# Patient Record
Sex: Female | Born: 1945
Health system: Southern US, Community
[De-identification: ages and names within clinical notes are randomized; demographics above are authoritative.]

## PROBLEM LIST (undated history)

## (undated) DIAGNOSIS — I4891 Unspecified atrial fibrillation: Secondary | ICD-10-CM

## (undated) DIAGNOSIS — I10 Essential (primary) hypertension: Secondary | ICD-10-CM

## (undated) DIAGNOSIS — I517 Cardiomegaly: Secondary | ICD-10-CM

## (undated) DIAGNOSIS — H409 Unspecified glaucoma: Secondary | ICD-10-CM

## (undated) HISTORY — PX: OTHER SURGICAL HISTORY: SHX169

## (undated) HISTORY — DX: Unspecified glaucoma: H40.9

---

## 2014-06-08 ENCOUNTER — Encounter (HOSPITAL_BASED_OUTPATIENT_CLINIC_OR_DEPARTMENT_OTHER): Payer: Self-pay | Admitting: Emergency Medicine

## 2014-06-08 ENCOUNTER — Emergency Department (HOSPITAL_BASED_OUTPATIENT_CLINIC_OR_DEPARTMENT_OTHER)
Admission: EM | Admit: 2014-06-08 | Discharge: 2014-06-08 | Disposition: A | Discharge status: Home / Self Care | Payer: Medicare HMO | Attending: Emergency Medicine | Admitting: Emergency Medicine

## 2014-06-08 DIAGNOSIS — Z79899 Other long term (current) drug therapy: Secondary | ICD-10-CM | POA: Diagnosis not present

## 2014-06-08 DIAGNOSIS — I10 Essential (primary) hypertension: Secondary | ICD-10-CM

## 2014-06-08 DIAGNOSIS — F17200 Nicotine dependence, unspecified, uncomplicated: Secondary | ICD-10-CM | POA: Insufficient documentation

## 2014-06-08 DIAGNOSIS — Z880 Allergy status to penicillin: Secondary | ICD-10-CM | POA: Insufficient documentation

## 2014-06-08 DIAGNOSIS — R51 Headache: Secondary | ICD-10-CM | POA: Diagnosis present

## 2014-06-08 DIAGNOSIS — G479 Sleep disorder, unspecified: Secondary | ICD-10-CM | POA: Diagnosis not present

## 2014-06-08 HISTORY — DX: Essential (primary) hypertension: I10

## 2014-06-08 HISTORY — DX: Cardiomegaly: I51.7

## 2014-06-08 MED ORDER — OXYCODONE-ACETAMINOPHEN 5-325 MG PO TABS
2.0000 | ORAL_TABLET | Freq: Once | ORAL | Status: AC
  Administered 2014-06-08: 2 via ORAL
  Filled 2014-06-08: qty 2

## 2014-06-08 NOTE — ED Notes (Signed)
Pt c/o HA, fatigue, and high bp since yesterday.

## 2014-06-08 NOTE — ED Provider Notes (Signed)
CSN: [id number]     Arrival date & time 06/08/14  1349 History  This chart was scribed for Rolan Bucco, MD by Nicholos Johns, ED scribe. This patient was seen in room MH01/MH01 and the patient's care was started at 3:21 PM.    Chief Complaint  Patient presents with  . Headache   The history is provided by the patient. No language interpreter was used.   HPI Comments: Brooke Gray is a 68 y.o. female who presents to the Emergency Department complaining of intermittent left frontal and parietal pounding HA, onset 1 day prior in the AM. HA initially began on the right side and has since moved to the left side. Reports same type of headaches in the past when her BP has been elevated.  Episode of chest pain yesterday that lasted a few seconds than resolved; no CP other than that one time. States BP has been up for the past couple of days; worsened as of yesterday. Did not physically take a BP but states she knows when it is up. Reports lack of sleep and exhaustion for the past 2 weeks. Believes this lack of sleep is responsible for elevated pressures. Has been prescribed sleeping pills by her PCP but does not take them daily; only as needed. Has used Melatonin before but did not like the side effects. Drinks a half a cup of coffee every AM. Denies numbness, weakness, chest pain, SOB, visual disturbances, or abdominal pain.    Past Medical History  Diagnosis Date  . Hypertension   . Cardiomegaly    History reviewed. No pertinent past surgical history. No family history on file. History  Substance Use Topics  . Smoking status: Current Every Day Smoker  . Smokeless tobacco: Not on file  . Alcohol Use: Yes   OB History   Grav Para Term Preterm Abortions TAB SAB Ect Mult Living                 Review of Systems  Constitutional: Negative for fever, chills, diaphoresis and fatigue.  HENT: Negative for congestion, rhinorrhea and sneezing.   Eyes: Negative.  Negative for visual disturbance.   Respiratory: Negative for cough, chest tightness and shortness of breath.   Cardiovascular: Negative for chest pain and leg swelling.  Gastrointestinal: Negative for nausea, vomiting, abdominal pain, diarrhea and blood in stool.  Genitourinary: Negative for frequency, hematuria, flank pain and difficulty urinating.  Musculoskeletal: Negative for arthralgias and back pain.  Skin: Negative for rash.  Neurological: Positive for headaches. Negative for dizziness, speech difficulty, weakness and numbness.  Psychiatric/Behavioral: Positive for sleep disturbance.  All other systems reviewed and are negative.  Allergies  Penicillins  Home Medications   Prior to Admission medications   Medication Sig Start Date End Date Taking? Authorizing Provider  amLODipine (NORVASC) 10 MG tablet Take 10 mg by mouth daily.   Yes Historical Provider, MD  HYDROcodone-acetaminophen (NORCO/VICODIN) 5-325 MG per tablet Take 1 tablet by mouth every 6 (six) hours as needed for moderate pain.   Yes Historical Provider, MD  lisinopril-hydrochlorothiazide (PRINZIDE,ZESTORETIC) 20-12.5 MG per tablet Take 1 tablet by mouth daily.   Yes Historical Provider, MD   Triage vitals: BP 147/85  Pulse 52  Temp(Src) 97.6 F (36.4 C) (Oral)  Resp 18  Ht 5\' 3"  (1.6 m)  Wt 135 lb (61.236 kg)  BMI 23.92 kg/m2  SpO2 100%  Physical Exam  Nursing note and vitals reviewed. Constitutional: She is oriented to person, place, and time. She appears well-developed  and well-nourished.  HENT:  Head: Normocephalic and atraumatic.  Eyes: Pupils are equal, round, and reactive to light.  Neck: Normal range of motion. Neck supple.  Cardiovascular: Normal rate, regular rhythm and normal heart sounds.   Pulmonary/Chest: Effort normal and breath sounds normal. No respiratory distress. She has no wheezes. She has no rales. She exhibits no tenderness.  Abdominal: Soft. Bowel sounds are normal. There is no tenderness. There is no rebound and no  guarding.  Musculoskeletal: Normal range of motion. She exhibits no edema.  Lymphadenopathy:    She has no cervical adenopathy.  Neurological: She is alert and oriented to person, place, and time.  Skin: Skin is warm and dry. No rash noted.  Psychiatric: She has a normal mood and affect.   3:31 PM: BP laying down; right arm: 165/98   ED Course  Procedures (including critical care time) DIAGNOSTIC STUDIES: Oxygen Saturation is 100% on room air, normal by my interpretation.    COORDINATION OF CARE: At 3:28 PM: Discussed treatment plan with patient which includes EKG and pain medication for HA. Patient agrees.    Labs Review Labs Reviewed - No data to display  Imaging Review No results found.   EKG Interpretation   Date/Time:  Sunday June 08 2014 15:41:41 EDT Ventricular Rate:  54 PR Interval:  184 QRS Duration: 76 QT Interval:  492 QTC Calculation: 466 R Axis:   12 Text Interpretation:  Sinus bradycardia Possible Left atrial enlargement  Left ventricular hypertrophy ST \\T \ T wave abnormality, consider inferior  ischemia Abnormal ECG No old tracing to compare Confirmed by Andalyn Heckstall  MD,  Leaner Morici ([id number]) on 06/08/2014 4:09:42 PM      MDM   Final diagnoses:  Essential hypertension   Pt's BP improved since arrival.  No evidence of hypertensive emergency/urgency.  EKG shows changes of LVH which pt is aware of.  Some biphasic t waves inferiorly, no old EKGs for comparison, but pt has only had one episode of CP yesterday for only a few seconds, none today, no SOB or other signs of ACS.  Pt was given percocet for her headache.  Will f/u with her PMD about her BP.  I personally performed the services described in this documentation, which was scribed in my presence. The recorded information has been reviewed and is accurate.     Rolan BuccoMelanie Bryer Gottsch, MD 06/08/14 [phone]

## 2014-06-08 NOTE — ED Notes (Signed)
Patient states that her BP has been irregular for the past two weeks, difficulty sleeping

## 2014-06-08 NOTE — Discharge Instructions (Signed)

## 2015-12-07 DIAGNOSIS — I10 Essential (primary) hypertension: Secondary | ICD-10-CM | POA: Diagnosis not present

## 2015-12-07 DIAGNOSIS — I119 Hypertensive heart disease without heart failure: Secondary | ICD-10-CM | POA: Diagnosis not present

## 2015-12-07 DIAGNOSIS — Z0001 Encounter for general adult medical examination with abnormal findings: Secondary | ICD-10-CM | POA: Diagnosis not present

## 2015-12-07 DIAGNOSIS — G629 Polyneuropathy, unspecified: Secondary | ICD-10-CM | POA: Diagnosis not present

## 2015-12-07 DIAGNOSIS — R7301 Impaired fasting glucose: Secondary | ICD-10-CM | POA: Diagnosis not present

## 2015-12-28 ENCOUNTER — Ambulatory Visit: Payer: Self-pay | Admitting: Podiatry

## 2016-01-11 DIAGNOSIS — I10 Essential (primary) hypertension: Secondary | ICD-10-CM | POA: Diagnosis not present

## 2016-01-11 DIAGNOSIS — I119 Hypertensive heart disease without heart failure: Secondary | ICD-10-CM | POA: Diagnosis not present

## 2016-01-20 DIAGNOSIS — I10 Essential (primary) hypertension: Secondary | ICD-10-CM | POA: Diagnosis not present

## 2016-01-20 DIAGNOSIS — I119 Hypertensive heart disease without heart failure: Secondary | ICD-10-CM | POA: Diagnosis not present

## 2016-01-21 ENCOUNTER — Ambulatory Visit: Payer: Self-pay | Admitting: Podiatry

## 2016-02-17 DIAGNOSIS — R921 Mammographic calcification found on diagnostic imaging of breast: Secondary | ICD-10-CM | POA: Diagnosis not present

## 2016-02-17 DIAGNOSIS — Z1231 Encounter for screening mammogram for malignant neoplasm of breast: Secondary | ICD-10-CM | POA: Diagnosis not present

## 2016-02-17 DIAGNOSIS — Z139 Encounter for screening, unspecified: Secondary | ICD-10-CM | POA: Diagnosis not present

## 2016-02-19 DIAGNOSIS — E785 Hyperlipidemia, unspecified: Secondary | ICD-10-CM | POA: Diagnosis not present

## 2016-02-19 DIAGNOSIS — Z720 Tobacco use: Secondary | ICD-10-CM | POA: Diagnosis not present

## 2016-02-19 DIAGNOSIS — G629 Polyneuropathy, unspecified: Secondary | ICD-10-CM | POA: Diagnosis not present

## 2016-02-19 DIAGNOSIS — I10 Essential (primary) hypertension: Secondary | ICD-10-CM | POA: Diagnosis not present

## 2016-02-19 DIAGNOSIS — I119 Hypertensive heart disease without heart failure: Secondary | ICD-10-CM | POA: Diagnosis not present

## 2016-02-25 DIAGNOSIS — I119 Hypertensive heart disease without heart failure: Secondary | ICD-10-CM | POA: Diagnosis not present

## 2016-02-25 DIAGNOSIS — I10 Essential (primary) hypertension: Secondary | ICD-10-CM | POA: Diagnosis not present

## 2016-03-17 DIAGNOSIS — I119 Hypertensive heart disease without heart failure: Secondary | ICD-10-CM | POA: Diagnosis not present

## 2016-03-17 DIAGNOSIS — I10 Essential (primary) hypertension: Secondary | ICD-10-CM | POA: Diagnosis not present

## 2016-04-19 DIAGNOSIS — I10 Essential (primary) hypertension: Secondary | ICD-10-CM | POA: Diagnosis not present

## 2016-04-19 DIAGNOSIS — I119 Hypertensive heart disease without heart failure: Secondary | ICD-10-CM | POA: Diagnosis not present

## 2016-05-06 DIAGNOSIS — I10 Essential (primary) hypertension: Secondary | ICD-10-CM | POA: Diagnosis not present

## 2016-05-06 DIAGNOSIS — Z720 Tobacco use: Secondary | ICD-10-CM | POA: Diagnosis not present

## 2016-05-06 DIAGNOSIS — R7303 Prediabetes: Secondary | ICD-10-CM | POA: Diagnosis not present

## 2016-05-06 DIAGNOSIS — E785 Hyperlipidemia, unspecified: Secondary | ICD-10-CM | POA: Diagnosis not present

## 2016-05-06 DIAGNOSIS — I119 Hypertensive heart disease without heart failure: Secondary | ICD-10-CM | POA: Diagnosis not present

## 2016-05-06 DIAGNOSIS — G629 Polyneuropathy, unspecified: Secondary | ICD-10-CM | POA: Diagnosis not present

## 2016-06-22 DIAGNOSIS — H401121 Primary open-angle glaucoma, left eye, mild stage: Secondary | ICD-10-CM | POA: Diagnosis not present

## 2016-06-22 DIAGNOSIS — H401113 Primary open-angle glaucoma, right eye, severe stage: Secondary | ICD-10-CM | POA: Diagnosis not present

## 2016-06-22 DIAGNOSIS — H2513 Age-related nuclear cataract, bilateral: Secondary | ICD-10-CM | POA: Diagnosis not present

## 2016-06-22 DIAGNOSIS — H52221 Regular astigmatism, right eye: Secondary | ICD-10-CM | POA: Diagnosis not present

## 2016-06-28 DIAGNOSIS — I10 Essential (primary) hypertension: Secondary | ICD-10-CM | POA: Diagnosis not present

## 2016-06-28 DIAGNOSIS — I119 Hypertensive heart disease without heart failure: Secondary | ICD-10-CM | POA: Diagnosis not present

## 2016-07-08 DIAGNOSIS — E785 Hyperlipidemia, unspecified: Secondary | ICD-10-CM | POA: Diagnosis not present

## 2016-07-08 DIAGNOSIS — Z720 Tobacco use: Secondary | ICD-10-CM | POA: Diagnosis not present

## 2016-07-08 DIAGNOSIS — G629 Polyneuropathy, unspecified: Secondary | ICD-10-CM | POA: Diagnosis not present

## 2016-07-08 DIAGNOSIS — I119 Hypertensive heart disease without heart failure: Secondary | ICD-10-CM | POA: Diagnosis not present

## 2016-07-08 DIAGNOSIS — I10 Essential (primary) hypertension: Secondary | ICD-10-CM | POA: Diagnosis not present

## 2016-07-11 DIAGNOSIS — L814 Other melanin hyperpigmentation: Secondary | ICD-10-CM | POA: Diagnosis not present

## 2016-07-11 DIAGNOSIS — R21 Rash and other nonspecific skin eruption: Secondary | ICD-10-CM | POA: Diagnosis not present

## 2016-07-20 ENCOUNTER — Ambulatory Visit: Payer: Medicare Other | Admitting: Podiatry

## 2016-07-21 DIAGNOSIS — Z720 Tobacco use: Secondary | ICD-10-CM | POA: Diagnosis not present

## 2016-07-21 DIAGNOSIS — E785 Hyperlipidemia, unspecified: Secondary | ICD-10-CM | POA: Diagnosis not present

## 2016-07-21 DIAGNOSIS — G629 Polyneuropathy, unspecified: Secondary | ICD-10-CM | POA: Diagnosis not present

## 2016-07-21 DIAGNOSIS — I119 Hypertensive heart disease without heart failure: Secondary | ICD-10-CM | POA: Diagnosis not present

## 2016-07-21 DIAGNOSIS — I10 Essential (primary) hypertension: Secondary | ICD-10-CM | POA: Diagnosis not present

## 2016-08-12 DIAGNOSIS — I10 Essential (primary) hypertension: Secondary | ICD-10-CM | POA: Diagnosis not present

## 2016-08-12 DIAGNOSIS — I119 Hypertensive heart disease without heart failure: Secondary | ICD-10-CM | POA: Diagnosis not present

## 2016-09-08 DIAGNOSIS — I10 Essential (primary) hypertension: Secondary | ICD-10-CM | POA: Diagnosis not present

## 2016-09-08 DIAGNOSIS — I119 Hypertensive heart disease without heart failure: Secondary | ICD-10-CM | POA: Diagnosis not present

## 2016-09-09 DIAGNOSIS — G629 Polyneuropathy, unspecified: Secondary | ICD-10-CM | POA: Diagnosis not present

## 2016-09-09 DIAGNOSIS — I10 Essential (primary) hypertension: Secondary | ICD-10-CM | POA: Diagnosis not present

## 2016-09-09 DIAGNOSIS — I119 Hypertensive heart disease without heart failure: Secondary | ICD-10-CM | POA: Diagnosis not present

## 2016-09-09 DIAGNOSIS — E785 Hyperlipidemia, unspecified: Secondary | ICD-10-CM | POA: Diagnosis not present

## 2016-09-09 DIAGNOSIS — M25561 Pain in right knee: Secondary | ICD-10-CM | POA: Diagnosis not present

## 2016-09-26 DIAGNOSIS — M25461 Effusion, right knee: Secondary | ICD-10-CM | POA: Diagnosis not present

## 2016-09-26 DIAGNOSIS — M25561 Pain in right knee: Secondary | ICD-10-CM | POA: Diagnosis not present

## 2016-09-26 DIAGNOSIS — M1711 Unilateral primary osteoarthritis, right knee: Secondary | ICD-10-CM | POA: Diagnosis not present

## 2016-10-05 DIAGNOSIS — I119 Hypertensive heart disease without heart failure: Secondary | ICD-10-CM | POA: Diagnosis not present

## 2016-10-05 DIAGNOSIS — I10 Essential (primary) hypertension: Secondary | ICD-10-CM | POA: Diagnosis not present

## 2016-11-01 DIAGNOSIS — I10 Essential (primary) hypertension: Secondary | ICD-10-CM | POA: Diagnosis not present

## 2016-11-01 DIAGNOSIS — G629 Polyneuropathy, unspecified: Secondary | ICD-10-CM | POA: Diagnosis not present

## 2016-11-01 DIAGNOSIS — R7303 Prediabetes: Secondary | ICD-10-CM | POA: Diagnosis not present

## 2016-11-01 DIAGNOSIS — Z720 Tobacco use: Secondary | ICD-10-CM | POA: Diagnosis not present

## 2016-11-01 DIAGNOSIS — R739 Hyperglycemia, unspecified: Secondary | ICD-10-CM | POA: Diagnosis not present

## 2016-11-01 DIAGNOSIS — Z7689 Persons encountering health services in other specified circumstances: Secondary | ICD-10-CM | POA: Diagnosis not present

## 2016-11-01 DIAGNOSIS — I119 Hypertensive heart disease without heart failure: Secondary | ICD-10-CM | POA: Diagnosis not present

## 2016-11-01 DIAGNOSIS — E785 Hyperlipidemia, unspecified: Secondary | ICD-10-CM | POA: Diagnosis not present

## 2016-11-03 DIAGNOSIS — I10 Essential (primary) hypertension: Secondary | ICD-10-CM | POA: Diagnosis not present

## 2016-11-03 DIAGNOSIS — I119 Hypertensive heart disease without heart failure: Secondary | ICD-10-CM | POA: Diagnosis not present

## 2016-11-24 DIAGNOSIS — R7303 Prediabetes: Secondary | ICD-10-CM | POA: Diagnosis not present

## 2016-11-24 DIAGNOSIS — I119 Hypertensive heart disease without heart failure: Secondary | ICD-10-CM | POA: Diagnosis not present

## 2016-11-24 DIAGNOSIS — Z5181 Encounter for therapeutic drug level monitoring: Secondary | ICD-10-CM | POA: Diagnosis not present

## 2016-11-24 DIAGNOSIS — Z720 Tobacco use: Secondary | ICD-10-CM | POA: Diagnosis not present

## 2016-11-24 DIAGNOSIS — G629 Polyneuropathy, unspecified: Secondary | ICD-10-CM | POA: Diagnosis not present

## 2016-11-24 DIAGNOSIS — I10 Essential (primary) hypertension: Secondary | ICD-10-CM | POA: Diagnosis not present

## 2016-11-24 DIAGNOSIS — E785 Hyperlipidemia, unspecified: Secondary | ICD-10-CM | POA: Diagnosis not present

## 2016-12-05 DIAGNOSIS — I119 Hypertensive heart disease without heart failure: Secondary | ICD-10-CM | POA: Diagnosis not present

## 2016-12-05 DIAGNOSIS — E785 Hyperlipidemia, unspecified: Secondary | ICD-10-CM | POA: Diagnosis not present

## 2016-12-05 DIAGNOSIS — G629 Polyneuropathy, unspecified: Secondary | ICD-10-CM | POA: Diagnosis not present

## 2016-12-05 DIAGNOSIS — Z0000 Encounter for general adult medical examination without abnormal findings: Secondary | ICD-10-CM | POA: Diagnosis not present

## 2016-12-05 DIAGNOSIS — I10 Essential (primary) hypertension: Secondary | ICD-10-CM | POA: Diagnosis not present

## 2017-01-16 DIAGNOSIS — H401121 Primary open-angle glaucoma, left eye, mild stage: Secondary | ICD-10-CM | POA: Diagnosis not present

## 2017-01-16 DIAGNOSIS — H2513 Age-related nuclear cataract, bilateral: Secondary | ICD-10-CM | POA: Diagnosis not present

## 2017-01-16 DIAGNOSIS — H401113 Primary open-angle glaucoma, right eye, severe stage: Secondary | ICD-10-CM | POA: Diagnosis not present

## 2017-02-07 DIAGNOSIS — I10 Essential (primary) hypertension: Secondary | ICD-10-CM | POA: Diagnosis not present

## 2017-02-07 DIAGNOSIS — M25561 Pain in right knee: Secondary | ICD-10-CM | POA: Diagnosis not present

## 2017-02-07 DIAGNOSIS — N760 Acute vaginitis: Secondary | ICD-10-CM | POA: Diagnosis not present

## 2017-02-07 DIAGNOSIS — I119 Hypertensive heart disease without heart failure: Secondary | ICD-10-CM | POA: Diagnosis not present

## 2017-02-07 DIAGNOSIS — E785 Hyperlipidemia, unspecified: Secondary | ICD-10-CM | POA: Diagnosis not present

## 2017-05-08 DIAGNOSIS — G629 Polyneuropathy, unspecified: Secondary | ICD-10-CM | POA: Diagnosis not present

## 2017-05-08 DIAGNOSIS — I10 Essential (primary) hypertension: Secondary | ICD-10-CM | POA: Diagnosis not present

## 2017-05-08 DIAGNOSIS — I119 Hypertensive heart disease without heart failure: Secondary | ICD-10-CM | POA: Diagnosis not present

## 2017-05-08 DIAGNOSIS — E785 Hyperlipidemia, unspecified: Secondary | ICD-10-CM | POA: Diagnosis not present

## 2017-05-08 DIAGNOSIS — R7303 Prediabetes: Secondary | ICD-10-CM | POA: Diagnosis not present

## 2017-05-08 DIAGNOSIS — Z0000 Encounter for general adult medical examination without abnormal findings: Secondary | ICD-10-CM | POA: Diagnosis not present

## 2017-05-16 ENCOUNTER — Ambulatory Visit (INDEPENDENT_AMBULATORY_CARE_PROVIDER_SITE_OTHER): Payer: Medicare Other | Admitting: Podiatry

## 2017-05-16 ENCOUNTER — Encounter: Payer: Self-pay | Admitting: Podiatry

## 2017-05-16 VITALS — BP 147/90 | HR 65 | Ht 63.0 in | Wt 131.0 lb

## 2017-05-16 DIAGNOSIS — M21962 Unspecified acquired deformity of left lower leg: Secondary | ICD-10-CM | POA: Diagnosis not present

## 2017-05-16 DIAGNOSIS — Q828 Other specified congenital malformations of skin: Secondary | ICD-10-CM | POA: Diagnosis not present

## 2017-05-16 DIAGNOSIS — M79672 Pain in left foot: Secondary | ICD-10-CM | POA: Diagnosis not present

## 2017-05-16 NOTE — Progress Notes (Signed)
SUBJECTIVE: 71 y.o. year old female presents complaining of a corn inside of callus under the 5th toe left foot. She trims it to get relief. Patient is a Dietitiandance instructor.   REVIEW OF SYSTEMS: Pertinent items noted in HPI and remainder of comprehensive ROS otherwise negative.  OBJECTIVE: DERMATOLOGIC EXAMINATION: Plantar porokeratosis sub 5 L>R. Painful on left.  VASCULAR EXAMINATION OF LOWER LIMBS: Pedal pulses: All pedal pulses are faintly palpable on both DP and PT bilateral. Capillary Filling times within 3 seconds in all digits.  Temperature gradient from tibial crest to dorsum of foot is within normal bilateral.  NEUROLOGIC EXAMINATION OF THE LOWER LIMBS: All epicritic and tactile sensations grossly intact. Sharp and Dull discriminatory sensations at the plantar ball of hallux is intact bilateral.   MUSCULOSKELETAL EXAMINATION: Positive for mild bunion on left foot.   ASSESSMENT: Painful porokeratosis sub 5 left.  PLAN: Reviewed findings and available options. Both feet 5th MPJ plantar lesions debrided. Return as needed.

## 2017-05-16 NOTE — Patient Instructions (Signed)
Seen for painful callus under the 5th MPJ left foot. Lesion debrided. Return as needed.

## 2017-05-18 DIAGNOSIS — H401113 Primary open-angle glaucoma, right eye, severe stage: Secondary | ICD-10-CM | POA: Diagnosis not present

## 2017-05-18 DIAGNOSIS — H2513 Age-related nuclear cataract, bilateral: Secondary | ICD-10-CM | POA: Diagnosis not present

## 2017-05-18 DIAGNOSIS — H04123 Dry eye syndrome of bilateral lacrimal glands: Secondary | ICD-10-CM | POA: Diagnosis not present

## 2017-05-18 DIAGNOSIS — H401121 Primary open-angle glaucoma, left eye, mild stage: Secondary | ICD-10-CM | POA: Diagnosis not present

## 2017-05-18 DIAGNOSIS — H5211 Myopia, right eye: Secondary | ICD-10-CM | POA: Diagnosis not present

## 2017-05-29 DIAGNOSIS — E785 Hyperlipidemia, unspecified: Secondary | ICD-10-CM | POA: Diagnosis not present

## 2017-05-29 DIAGNOSIS — I10 Essential (primary) hypertension: Secondary | ICD-10-CM | POA: Diagnosis not present

## 2017-05-29 DIAGNOSIS — G629 Polyneuropathy, unspecified: Secondary | ICD-10-CM | POA: Diagnosis not present

## 2017-05-29 DIAGNOSIS — I119 Hypertensive heart disease without heart failure: Secondary | ICD-10-CM | POA: Diagnosis not present

## 2017-09-04 DIAGNOSIS — M21612 Bunion of left foot: Secondary | ICD-10-CM | POA: Diagnosis not present

## 2017-09-04 DIAGNOSIS — L851 Acquired keratosis [keratoderma] palmaris et plantaris: Secondary | ICD-10-CM | POA: Diagnosis not present

## 2017-09-04 DIAGNOSIS — M21611 Bunion of right foot: Secondary | ICD-10-CM | POA: Diagnosis not present

## 2017-10-30 DIAGNOSIS — I10 Essential (primary) hypertension: Secondary | ICD-10-CM | POA: Diagnosis not present

## 2017-10-30 DIAGNOSIS — E785 Hyperlipidemia, unspecified: Secondary | ICD-10-CM | POA: Diagnosis not present

## 2017-10-30 DIAGNOSIS — R7303 Prediabetes: Secondary | ICD-10-CM | POA: Diagnosis not present

## 2017-10-30 DIAGNOSIS — I119 Hypertensive heart disease without heart failure: Secondary | ICD-10-CM | POA: Diagnosis not present

## 2017-10-30 DIAGNOSIS — G629 Polyneuropathy, unspecified: Secondary | ICD-10-CM | POA: Diagnosis not present

## 2017-11-03 DIAGNOSIS — J069 Acute upper respiratory infection, unspecified: Secondary | ICD-10-CM | POA: Diagnosis not present

## 2017-12-08 DIAGNOSIS — Z136 Encounter for screening for cardiovascular disorders: Secondary | ICD-10-CM | POA: Diagnosis not present

## 2017-12-08 DIAGNOSIS — Z01118 Encounter for examination of ears and hearing with other abnormal findings: Secondary | ICD-10-CM | POA: Diagnosis not present

## 2017-12-08 DIAGNOSIS — Z87891 Personal history of nicotine dependence: Secondary | ICD-10-CM | POA: Diagnosis not present

## 2017-12-08 DIAGNOSIS — Z23 Encounter for immunization: Secondary | ICD-10-CM | POA: Diagnosis not present

## 2017-12-08 DIAGNOSIS — Z131 Encounter for screening for diabetes mellitus: Secondary | ICD-10-CM | POA: Diagnosis not present

## 2017-12-08 DIAGNOSIS — Z0000 Encounter for general adult medical examination without abnormal findings: Secondary | ICD-10-CM | POA: Diagnosis not present

## 2017-12-08 DIAGNOSIS — I119 Hypertensive heart disease without heart failure: Secondary | ICD-10-CM | POA: Diagnosis not present

## 2017-12-20 ENCOUNTER — Other Ambulatory Visit: Payer: Self-pay | Admitting: Internal Medicine

## 2017-12-20 DIAGNOSIS — E2839 Other primary ovarian failure: Secondary | ICD-10-CM

## 2017-12-20 DIAGNOSIS — Z1231 Encounter for screening mammogram for malignant neoplasm of breast: Secondary | ICD-10-CM

## 2018-01-02 DIAGNOSIS — E785 Hyperlipidemia, unspecified: Secondary | ICD-10-CM | POA: Diagnosis not present

## 2018-01-02 DIAGNOSIS — I119 Hypertensive heart disease without heart failure: Secondary | ICD-10-CM | POA: Diagnosis not present

## 2018-01-02 DIAGNOSIS — R7303 Prediabetes: Secondary | ICD-10-CM | POA: Diagnosis not present

## 2018-01-02 DIAGNOSIS — I10 Essential (primary) hypertension: Secondary | ICD-10-CM | POA: Diagnosis not present

## 2018-01-09 ENCOUNTER — Inpatient Hospital Stay
Admission: RE | Admit: 2018-01-09 | Discharge: 2018-01-09 | Disposition: A | Discharge status: Home / Self Care | Payer: Medicare HMO | Source: Ambulatory Visit | Attending: Internal Medicine | Admitting: Internal Medicine

## 2018-01-09 ENCOUNTER — Ambulatory Visit: Payer: Medicare HMO

## 2018-01-10 DIAGNOSIS — H401113 Primary open-angle glaucoma, right eye, severe stage: Secondary | ICD-10-CM | POA: Diagnosis not present

## 2018-01-10 DIAGNOSIS — Z79899 Other long term (current) drug therapy: Secondary | ICD-10-CM | POA: Diagnosis not present

## 2018-01-10 DIAGNOSIS — H401121 Primary open-angle glaucoma, left eye, mild stage: Secondary | ICD-10-CM | POA: Diagnosis not present

## 2018-01-25 DIAGNOSIS — I119 Hypertensive heart disease without heart failure: Secondary | ICD-10-CM | POA: Diagnosis not present

## 2018-01-25 DIAGNOSIS — R7303 Prediabetes: Secondary | ICD-10-CM | POA: Diagnosis not present

## 2018-01-25 DIAGNOSIS — I10 Essential (primary) hypertension: Secondary | ICD-10-CM | POA: Diagnosis not present

## 2018-01-25 DIAGNOSIS — E785 Hyperlipidemia, unspecified: Secondary | ICD-10-CM | POA: Diagnosis not present

## 2018-04-19 DIAGNOSIS — I119 Hypertensive heart disease without heart failure: Secondary | ICD-10-CM | POA: Diagnosis not present

## 2018-04-19 DIAGNOSIS — I10 Essential (primary) hypertension: Secondary | ICD-10-CM | POA: Diagnosis not present

## 2018-04-19 DIAGNOSIS — R7303 Prediabetes: Secondary | ICD-10-CM | POA: Diagnosis not present

## 2018-04-19 DIAGNOSIS — J302 Other seasonal allergic rhinitis: Secondary | ICD-10-CM | POA: Diagnosis not present

## 2018-05-23 DIAGNOSIS — Z1211 Encounter for screening for malignant neoplasm of colon: Secondary | ICD-10-CM | POA: Diagnosis not present

## 2018-05-23 DIAGNOSIS — Z1212 Encounter for screening for malignant neoplasm of rectum: Secondary | ICD-10-CM | POA: Diagnosis not present

## 2018-06-29 DIAGNOSIS — Z0000 Encounter for general adult medical examination without abnormal findings: Secondary | ICD-10-CM | POA: Diagnosis not present

## 2018-06-29 DIAGNOSIS — E785 Hyperlipidemia, unspecified: Secondary | ICD-10-CM | POA: Diagnosis not present

## 2018-06-29 DIAGNOSIS — I119 Hypertensive heart disease without heart failure: Secondary | ICD-10-CM | POA: Diagnosis not present

## 2018-08-15 DIAGNOSIS — H401121 Primary open-angle glaucoma, left eye, mild stage: Secondary | ICD-10-CM | POA: Diagnosis not present

## 2018-08-15 DIAGNOSIS — H401113 Primary open-angle glaucoma, right eye, severe stage: Secondary | ICD-10-CM | POA: Diagnosis not present

## 2018-08-15 DIAGNOSIS — H04123 Dry eye syndrome of bilateral lacrimal glands: Secondary | ICD-10-CM | POA: Diagnosis not present

## 2018-08-15 DIAGNOSIS — H2513 Age-related nuclear cataract, bilateral: Secondary | ICD-10-CM | POA: Diagnosis not present

## 2018-08-28 DIAGNOSIS — I10 Essential (primary) hypertension: Secondary | ICD-10-CM | POA: Diagnosis not present

## 2018-08-28 DIAGNOSIS — E785 Hyperlipidemia, unspecified: Secondary | ICD-10-CM | POA: Diagnosis not present

## 2018-08-28 DIAGNOSIS — J302 Other seasonal allergic rhinitis: Secondary | ICD-10-CM | POA: Diagnosis not present

## 2018-08-28 DIAGNOSIS — I119 Hypertensive heart disease without heart failure: Secondary | ICD-10-CM | POA: Diagnosis not present

## 2018-09-20 ENCOUNTER — Other Ambulatory Visit: Payer: Self-pay

## 2018-09-20 NOTE — Patient Outreach (Signed)
[facility]) Care Management  09/20/2018  Brooke Gray 21-Nov-1945 [id number]   Medication Adherence call to Mrs. Brooke Gray left a message for patient to call back patient is due on Olmesartan/HCTZ under [facility] Ins.    Lillia Abed CPhT Pharmacy Technician [facility] Management Direct Dial [phone]  Fax [phone] [email or web address]

## 2018-10-05 DIAGNOSIS — I10 Essential (primary) hypertension: Secondary | ICD-10-CM | POA: Diagnosis not present

## 2018-10-05 DIAGNOSIS — I119 Hypertensive heart disease without heart failure: Secondary | ICD-10-CM | POA: Diagnosis not present

## 2018-10-05 DIAGNOSIS — F5101 Primary insomnia: Secondary | ICD-10-CM | POA: Diagnosis not present

## 2018-10-05 DIAGNOSIS — E785 Hyperlipidemia, unspecified: Secondary | ICD-10-CM | POA: Diagnosis not present

## 2018-10-05 DIAGNOSIS — J302 Other seasonal allergic rhinitis: Secondary | ICD-10-CM | POA: Diagnosis not present

## 2018-10-30 DIAGNOSIS — E785 Hyperlipidemia, unspecified: Secondary | ICD-10-CM | POA: Diagnosis not present

## 2018-10-30 DIAGNOSIS — I10 Essential (primary) hypertension: Secondary | ICD-10-CM | POA: Diagnosis not present

## 2018-10-30 DIAGNOSIS — I119 Hypertensive heart disease without heart failure: Secondary | ICD-10-CM | POA: Diagnosis not present

## 2018-10-30 DIAGNOSIS — J302 Other seasonal allergic rhinitis: Secondary | ICD-10-CM | POA: Diagnosis not present

## 2018-10-30 DIAGNOSIS — F5101 Primary insomnia: Secondary | ICD-10-CM | POA: Diagnosis not present

## 2018-12-03 DIAGNOSIS — H0261 Xanthelasma of right upper eyelid: Secondary | ICD-10-CM | POA: Diagnosis not present

## 2018-12-03 DIAGNOSIS — L810 Postinflammatory hyperpigmentation: Secondary | ICD-10-CM | POA: Diagnosis not present

## 2019-01-22 DIAGNOSIS — Z0001 Encounter for general adult medical examination with abnormal findings: Secondary | ICD-10-CM | POA: Diagnosis not present

## 2019-01-22 DIAGNOSIS — Z87891 Personal history of nicotine dependence: Secondary | ICD-10-CM | POA: Diagnosis not present

## 2019-01-22 DIAGNOSIS — R7303 Prediabetes: Secondary | ICD-10-CM | POA: Diagnosis not present

## 2019-01-22 DIAGNOSIS — J302 Other seasonal allergic rhinitis: Secondary | ICD-10-CM | POA: Diagnosis not present

## 2019-01-22 DIAGNOSIS — I10 Essential (primary) hypertension: Secondary | ICD-10-CM | POA: Diagnosis not present

## 2019-01-22 DIAGNOSIS — I119 Hypertensive heart disease without heart failure: Secondary | ICD-10-CM | POA: Diagnosis not present

## 2019-01-22 DIAGNOSIS — E785 Hyperlipidemia, unspecified: Secondary | ICD-10-CM | POA: Diagnosis not present

## 2019-01-22 DIAGNOSIS — F5101 Primary insomnia: Secondary | ICD-10-CM | POA: Diagnosis not present

## 2019-03-19 DIAGNOSIS — J302 Other seasonal allergic rhinitis: Secondary | ICD-10-CM | POA: Diagnosis not present

## 2019-03-19 DIAGNOSIS — I10 Essential (primary) hypertension: Secondary | ICD-10-CM | POA: Diagnosis not present

## 2019-03-19 DIAGNOSIS — R7303 Prediabetes: Secondary | ICD-10-CM | POA: Diagnosis not present

## 2019-03-19 DIAGNOSIS — F419 Anxiety disorder, unspecified: Secondary | ICD-10-CM | POA: Diagnosis not present

## 2019-03-19 DIAGNOSIS — I119 Hypertensive heart disease without heart failure: Secondary | ICD-10-CM | POA: Diagnosis not present

## 2019-03-19 DIAGNOSIS — Z87891 Personal history of nicotine dependence: Secondary | ICD-10-CM | POA: Diagnosis not present

## 2019-03-19 DIAGNOSIS — F5101 Primary insomnia: Secondary | ICD-10-CM | POA: Diagnosis not present

## 2019-03-19 DIAGNOSIS — R0989 Other specified symptoms and signs involving the circulatory and respiratory systems: Secondary | ICD-10-CM | POA: Diagnosis not present

## 2019-03-19 DIAGNOSIS — E785 Hyperlipidemia, unspecified: Secondary | ICD-10-CM | POA: Diagnosis not present

## 2019-04-10 ENCOUNTER — Other Ambulatory Visit: Payer: Self-pay

## 2019-04-10 ENCOUNTER — Ambulatory Visit: Payer: Medicare HMO | Admitting: Allergy and Immunology

## 2019-04-10 ENCOUNTER — Encounter: Payer: Self-pay | Admitting: Allergy and Immunology

## 2019-04-10 VITALS — BP 134/60 | HR 76 | Temp 98.2°F | Resp 16 | Ht 63.0 in | Wt 129.6 lb

## 2019-04-10 DIAGNOSIS — R09A2 Foreign body sensation, throat: Secondary | ICD-10-CM

## 2019-04-10 DIAGNOSIS — Z87891 Personal history of nicotine dependence: Secondary | ICD-10-CM | POA: Insufficient documentation

## 2019-04-10 DIAGNOSIS — R0989 Other specified symptoms and signs involving the circulatory and respiratory systems: Secondary | ICD-10-CM | POA: Diagnosis not present

## 2019-04-10 DIAGNOSIS — J310 Chronic rhinitis: Secondary | ICD-10-CM | POA: Insufficient documentation

## 2019-04-10 DIAGNOSIS — Z720 Tobacco use: Secondary | ICD-10-CM | POA: Diagnosis not present

## 2019-04-10 DIAGNOSIS — R198 Other specified symptoms and signs involving the digestive system and abdomen: Secondary | ICD-10-CM | POA: Insufficient documentation

## 2019-04-10 MED ORDER — AZELASTINE HCL 0.1 % NA SOLN: NASAL | 5 refills | Status: DC

## 2019-04-10 MED ORDER — CARBINOXAMINE MALEATE 4 MG PO TABS: ORAL_TABLET | ORAL | 5 refills | Status: DC

## 2019-04-10 NOTE — Assessment & Plan Note (Signed)
All seasonal and perennial aeroallergen skin tests are negative despite a positive histamine control.  Intranasal steroids, intranasal antihistamines, and first generation antihistamines are effective for symptoms associated with non-allergic rhinitis, whereas second generation antihistamines such as cetirizine (Zyrtec), loratadine (Claritin) and fexofenadine (Allegra) have been found to be ineffective for this condition.  Treatment plan as outlined above.

## 2019-04-10 NOTE — Progress Notes (Signed)
New Patient Note  RE: Brooke Gray MRN: [id number] DOB: 06/04/1946 Date of Office Visit: 04/10/2019  Referring provider: Jackie Plum, MD Primary care provider: Jackie Plum, MD  Chief Complaint: Thick post nasal drainage   History of present illness: Brooke Gray is a 73 y.o. female seen today in consultation requested by Jackie Plum, MD.  She reports that over the past 18 to 24 months she has experienced intermittent thick postnasal drainage she reports that "the mucus buildup was just sitting there and then would move down into the stomach."  At times there is so much thick postnasal drainage that she lacks the desire to eat.  Recently, she has been experiencing postnasal drainage every 3 to 4 days on average.  No significant seasonal symptom variation has been noted nor have specific environmental triggers been identified.  However, she is curious if there is an environmental or food allergen trigger causing the problem.  She has tried prescription medications "she does not recall the names of those medications" without adequate symptom relief, Mucinex without adequate symptom relief, and a variety of natural remedies without adequate symptom relief.  She has used a Engineer, materials pot which has reduced nasal and sinus congestion, however does not seem to reduce the postnasal drainage.  She denies heartburn.  She has smoked cigarettes over the past 30 years.  She is attempting to quit smoking and is currently down to 4 cigarettes/day on average.  Assessment and plan: Globus sensation Postnasal drainage secondary to nonallergic rhinosinusitis versus silent reflux.  Tobacco cessation has been discussed and encouraged.  A prescription has been provided for azelastine nasal spray, 1-2 sprays per nostril 2 times daily as needed. Proper nasal spray technique has been discussed and demonstrated.   Nasal saline lavage (NeilMed) has been recommended as needed and prior to  medicated nasal sprays along with instructions for proper administration.  A prescription has been provided for carbinoxamine 4 mg every 6-8 hours if needed.  Brooke Gray.  If this problem persists or progresses despite the treatment plan as outlined Gray, consider gastroenterology and/or otolaryngology consultation.  Nonallergic rhinitis All seasonal and perennial aeroallergen skin tests are negative despite a positive histamine control.  Intranasal steroids, intranasal antihistamines, and first generation antihistamines are effective for symptoms associated with non-allergic rhinitis, whereas second generation antihistamines such as cetirizine (Zyrtec), loratadine (Claritin) and fexofenadine (Allegra) have been found to be ineffective for this condition.  Treatment plan as outlined Gray.  Tobacco use  Tobacco cessation has been discussed and encouraged.   Meds ordered this encounter  Medications  . azelastine (ASTELIN) 0.1 % nasal spray    Sig: 1-2 sprays per nostril 2 times daily as needed.    Dispense:  30 mL    Refill:  5  . Carbinoxamine Maleate 4 MG TABS    Sig: Take 1 tablet every 6-8 hours if needed    Dispense:  90 each    Refill:  5    Diagnostics: Epicutaneous testing: Negative despite a positive histamine control. Intradermal testing: Negative. Food allergen skin testing: Negative despite a positive histamine control.    Physical examination: Blood pressure 134/60, pulse 76, temperature 98.2 F (36.8 C), temperature source Oral, resp. rate 16, height  (1.6 m), weight 129 lb 10.1 oz (58.8 kg), SpO2 99 %.  General: Alert, interactive, in no acute distress. HEENT: TMs pearly gray, turbinates moderately edematous without discharge, post-pharynx moderately erythematous.  Neck: Supple without lymphadenopathy. Lungs: Clear to auscultation without wheezing, rhonchi or rales.  CV: Normal S1, S2 without murmurs. Abdomen: Nondistended, nontender. Skin: Warm and dry, without lesions or rashes. Extremities:  No clubbing, cyanosis or edema. Neuro:   Grossly intact.  Review of systems:  Review of systems negative except as noted in HPI / PMHx or noted below: Review of Systems  Constitutional: Negative.   HENT: Negative.   Eyes: Negative.   Respiratory: Negative.   Cardiovascular: Negative.   Gastrointestinal: Negative.   Genitourinary: Negative.   Musculoskeletal: Negative.   Skin: Negative.   Neurological: Negative.   Endo/Heme/Allergies: Negative.   Psychiatric/Behavioral: Negative.     Past medical history:  Past Medical History:  Diagnosis Date  . Cardiomegaly   . Glaucoma   . Hypertension     Past surgical history:  Past Surgical History:  Procedure Laterality Date  . no past surgery      Family history: Family History  Problem Relation Age of Onset  . Allergic rhinitis Son   . Angioedema Neg Hx   . Asthma Neg Hx   . Eczema Neg Hx   . Immunodeficiency Neg Hx   . Urticaria Neg Hx     Social history: Social History   Socioeconomic History  . Marital status: Married    Spouse name: Not on file  . Number of children: Not on file  . Years of education: Not on file  . Highest education level: Not on file  Occupational History  . Not on file  Social Needs  . Financial resource strain: Not on file  . Food insecurity:    Worry: Not on file    Inability: Not on file  . Transportation needs:    Medical: Not on file    Non-medical: Not on file  Tobacco Use  . Smoking status: Current Every Day Smoker    Packs/day: 0.25    Years: 30.00    Pack years: 7.50    Types: Cigarettes  . Smokeless tobacco: Never Used  Substance and Sexual Activity  . Alcohol use: Yes  . Drug use: No  . Sexual activity: Not on file  Lifestyle  . Physical activity:    Days per week: Not on file    Minutes per session: Not on file  . Stress: Not on  file  Relationships  . Social connections:    Talks on phone: Not on file    Gets together: Not on file    Attends religious service: Not on file    Active member of club or organization: Not on file    Attends meetings of clubs or organizations: Not on file    Relationship status: Not on file  . Intimate partner violence:    Fear of current or ex partner: Not on file    Emotionally abused: Not on file    Physically abused: Not on file    Forced sexual activity: Not on file  Other Topics Concern  . Not on file  Social History Narrative  . Not on file   Environmental History: The patient lives in a 71 year old apartment with carpeting throughout and central air/heat.  There is no known mold/water damage in the home.  She smokes 4 cigarettes a day on average currently and has smoked cigarettes over the past 30 years.  Allergies as of 04/10/2019      Reactions   Penicillins Hives      Medication List  Accurate as of Apr 10, 2019  7:26 PM. If you have any questions, ask your nurse or doctor.        STOP taking these medications   lisinopril-hydrochlorothiazide 20-12.5 MG tablet Commonly known as:  ZESTORETIC Stopped by:  Wellington Hampshire Carter Hilliary Jock, MD     TAKE these medications   ALIVE ONCE DAILY WOMENS 50+ PO Take by mouth.   amLODipine 5 MG tablet Commonly known as:  NORVASC What changed:  Another medication with the same name was removed. Continue taking this medication, and follow the directions you see here. Changed by:  Wellington Hampshire Carter Dwanna Goshert, MD   azelastine 0.1 % nasal spray Commonly known as:  ASTELIN 1-2 sprays per nostril 2 times daily as needed. Started by:  Wellington Hampshire Carter Wanza Szumski, MD   Carbinoxamine Maleate 4 MG Tabs Take 1 tablet every 6-8 hours if needed Started by:  Wellington Hampshire Carter Athena Baltz, MD   diazepam 5 MG tablet Commonly known as:  VALIUM TK 1 T PO BID PRN   FLAX SEED OIL PO Take by mouth.   HYDROcodone-acetaminophen 5-325 MG tablet Commonly known as:   NORCO/VICODIN Take 1 tablet by mouth every 6 (six) hours as needed for moderate pain.   latanoprost 0.005 % ophthalmic solution Commonly known as:  XALATAN INT 1 GTT IN OU HS   Magnesium 400 MG Caps Take by mouth.   OIL OF OREGANO PO Take by mouth.   VITAMIN B-12 PO Take by mouth. Takes 3 times per wk.   vitamin C 1000 MG tablet Take 1,000 mg by mouth daily.   VITAMIN D3 PO Take 5,000 mcg by mouth.       Known medication allergies: Allergies  Allergen Reactions  . Penicillins Hives    I appreciate the opportunity to take part in Marilene's care. Please do not hesitate to contact me with questions.  Sincerely,   R. Jorene Guestarter Tyler Cubit, MD

## 2019-04-10 NOTE — Assessment & Plan Note (Addendum)
Postnasal drainage secondary to nonallergic rhinosinusitis versus silent reflux.  Tobacco cessation has been discussed and encouraged.  A prescription has been provided for azelastine nasal spray, 1-2 sprays per nostril 2 times daily as needed. Proper nasal spray technique has been discussed and demonstrated.   Nasal saline lavage (NeilMed) has been recommended as needed and prior to medicated nasal sprays along with instructions for proper administration.  A prescription has been provided for carbinoxamine 4 mg every 6-8 hours if needed.  Brooke Gray will consult with her ophthalmologist prior to starting any of the prescribed medications above.  If this problem persists or progresses despite the treatment plan as outlined above, consider gastroenterology and/or otolaryngology consultation.

## 2019-04-10 NOTE — Assessment & Plan Note (Signed)
   Tobacco cessation has been discussed and encouraged. 

## 2019-04-10 NOTE — Patient Instructions (Addendum)
Globus sensation Postnasal drainage secondary to nonallergic rhinosinusitis versus silent reflux.  Tobacco cessation has been discussed and encouraged.  A prescription has been provided for azelastine nasal spray, 1-2 sprays per nostril 2 times daily as needed. Proper nasal spray technique has been discussed and demonstrated.   Nasal saline lavage (NeilMed) has been recommended as needed and prior to medicated nasal sprays along with instructions for proper administration.  A prescription has been provided for carbinoxamine 4 mg every 6-8 hours if needed.  Trysten will consult with her ophthalmologist prior to starting any of the prescribed medications above.  If this problem persists or progresses despite the treatment plan as outlined above, consider gastroenterology and/or otolaryngology consultation.  Nonallergic rhinitis All seasonal and perennial aeroallergen skin tests are negative despite a positive histamine control.  Intranasal steroids, intranasal antihistamines, and first generation antihistamines are effective for symptoms associated with non-allergic rhinitis, whereas second generation antihistamines such as cetirizine (Zyrtec), loratadine (Claritin) and fexofenadine (Allegra) have been found to be ineffective for this condition.  Treatment plan as outlined above.  Tobacco use  Tobacco cessation has been discussed and encouraged.   Return in about 3 months (around 07/11/2019), or if symptoms worsen or fail to improve.

## 2019-04-12 ENCOUNTER — Telehealth: Payer: Self-pay | Admitting: *Deleted

## 2019-04-12 NOTE — Telephone Encounter (Signed)
PA submitted for Carbinoxamine 4 mg and it was denied. Please advise. Preferred is Levocetirizine or cetirizine.

## 2019-04-15 NOTE — Telephone Encounter (Signed)
Please submit a PA, she has failed second generation antihistamines.  Thanks.

## 2019-04-17 NOTE — Telephone Encounter (Signed)
[employer] called- appeal was approved.

## 2019-04-18 DIAGNOSIS — F419 Anxiety disorder, unspecified: Secondary | ICD-10-CM | POA: Diagnosis not present

## 2019-04-18 DIAGNOSIS — E785 Hyperlipidemia, unspecified: Secondary | ICD-10-CM | POA: Diagnosis not present

## 2019-04-18 DIAGNOSIS — J302 Other seasonal allergic rhinitis: Secondary | ICD-10-CM | POA: Diagnosis not present

## 2019-04-18 DIAGNOSIS — I119 Hypertensive heart disease without heart failure: Secondary | ICD-10-CM | POA: Diagnosis not present

## 2019-04-18 DIAGNOSIS — R7303 Prediabetes: Secondary | ICD-10-CM | POA: Diagnosis not present

## 2019-04-18 DIAGNOSIS — I10 Essential (primary) hypertension: Secondary | ICD-10-CM | POA: Diagnosis not present

## 2019-04-18 DIAGNOSIS — Z131 Encounter for screening for diabetes mellitus: Secondary | ICD-10-CM | POA: Diagnosis not present

## 2019-04-18 DIAGNOSIS — F5101 Primary insomnia: Secondary | ICD-10-CM | POA: Diagnosis not present

## 2019-04-18 DIAGNOSIS — Z87891 Personal history of nicotine dependence: Secondary | ICD-10-CM | POA: Diagnosis not present

## 2019-05-06 ENCOUNTER — Telehealth: Payer: Self-pay | Admitting: *Deleted

## 2019-05-06 MED ORDER — IPRATROPIUM BROMIDE 0.06 % NA SOLN: NASAL | 5 refills | Status: DC

## 2019-05-06 NOTE — Telephone Encounter (Signed)
That is fine. Ipratropium 0.06%, 2 sp per nostril every 8 hours as needed. Thanks.

## 2019-05-06 NOTE — Telephone Encounter (Signed)
Sent in new rx as written per Dr Verlin Fester patient has been called and advised of change

## 2019-05-06 NOTE — Telephone Encounter (Signed)
Patient called states azelastine is a tier 3 and it is $50 would like it switched to ipratropium which would be $8 tier is lower. States she spoke to pharmacy and insurance in regards to this matter. Dr Verlin Fester please advise

## 2019-05-09 ENCOUNTER — Telehealth: Payer: Self-pay | Admitting: Allergy and Immunology

## 2019-05-09 NOTE — Telephone Encounter (Signed)
Routed dr notes to pcp

## 2019-05-09 NOTE — Telephone Encounter (Signed)
Dr office rep, Dianna called to get chart notes from New PT appt  faxed to [phone] [facility]

## 2019-05-28 DIAGNOSIS — F419 Anxiety disorder, unspecified: Secondary | ICD-10-CM | POA: Diagnosis not present

## 2019-05-28 DIAGNOSIS — R7303 Prediabetes: Secondary | ICD-10-CM | POA: Diagnosis not present

## 2019-05-28 DIAGNOSIS — F5101 Primary insomnia: Secondary | ICD-10-CM | POA: Diagnosis not present

## 2019-05-28 DIAGNOSIS — Z87891 Personal history of nicotine dependence: Secondary | ICD-10-CM | POA: Diagnosis not present

## 2019-05-28 DIAGNOSIS — I119 Hypertensive heart disease without heart failure: Secondary | ICD-10-CM | POA: Diagnosis not present

## 2019-05-28 DIAGNOSIS — D72818 Other decreased white blood cell count: Secondary | ICD-10-CM | POA: Diagnosis not present

## 2019-05-28 DIAGNOSIS — I10 Essential (primary) hypertension: Secondary | ICD-10-CM | POA: Diagnosis not present

## 2019-05-28 DIAGNOSIS — J302 Other seasonal allergic rhinitis: Secondary | ICD-10-CM | POA: Diagnosis not present

## 2019-05-28 DIAGNOSIS — E785 Hyperlipidemia, unspecified: Secondary | ICD-10-CM | POA: Diagnosis not present

## 2019-06-27 DIAGNOSIS — I10 Essential (primary) hypertension: Secondary | ICD-10-CM | POA: Diagnosis not present

## 2019-06-27 DIAGNOSIS — I119 Hypertensive heart disease without heart failure: Secondary | ICD-10-CM | POA: Diagnosis not present

## 2019-06-27 DIAGNOSIS — F419 Anxiety disorder, unspecified: Secondary | ICD-10-CM | POA: Diagnosis not present

## 2019-06-27 DIAGNOSIS — J302 Other seasonal allergic rhinitis: Secondary | ICD-10-CM | POA: Diagnosis not present

## 2019-06-27 DIAGNOSIS — E785 Hyperlipidemia, unspecified: Secondary | ICD-10-CM | POA: Diagnosis not present

## 2019-06-27 DIAGNOSIS — Z87891 Personal history of nicotine dependence: Secondary | ICD-10-CM | POA: Diagnosis not present

## 2019-06-27 DIAGNOSIS — R1314 Dysphagia, pharyngoesophageal phase: Secondary | ICD-10-CM | POA: Diagnosis not present

## 2019-06-27 DIAGNOSIS — R7303 Prediabetes: Secondary | ICD-10-CM | POA: Diagnosis not present

## 2019-06-27 DIAGNOSIS — F5101 Primary insomnia: Secondary | ICD-10-CM | POA: Diagnosis not present

## 2019-07-09 DIAGNOSIS — R0989 Other specified symptoms and signs involving the circulatory and respiratory systems: Secondary | ICD-10-CM | POA: Diagnosis not present

## 2019-07-09 DIAGNOSIS — Z1159 Encounter for screening for other viral diseases: Secondary | ICD-10-CM | POA: Diagnosis not present

## 2019-07-11 ENCOUNTER — Ambulatory Visit: Payer: Medicare HMO | Admitting: Allergy and Immunology

## 2019-07-25 DIAGNOSIS — I10 Essential (primary) hypertension: Secondary | ICD-10-CM | POA: Diagnosis not present

## 2019-07-25 DIAGNOSIS — I119 Hypertensive heart disease without heart failure: Secondary | ICD-10-CM | POA: Diagnosis not present

## 2019-07-25 DIAGNOSIS — Z87891 Personal history of nicotine dependence: Secondary | ICD-10-CM | POA: Diagnosis not present

## 2019-07-25 DIAGNOSIS — R1314 Dysphagia, pharyngoesophageal phase: Secondary | ICD-10-CM | POA: Diagnosis not present

## 2019-07-25 DIAGNOSIS — E785 Hyperlipidemia, unspecified: Secondary | ICD-10-CM | POA: Diagnosis not present

## 2019-07-25 DIAGNOSIS — R7303 Prediabetes: Secondary | ICD-10-CM | POA: Diagnosis not present

## 2019-07-25 DIAGNOSIS — J302 Other seasonal allergic rhinitis: Secondary | ICD-10-CM | POA: Diagnosis not present

## 2019-07-25 DIAGNOSIS — F419 Anxiety disorder, unspecified: Secondary | ICD-10-CM | POA: Diagnosis not present

## 2019-07-25 DIAGNOSIS — F5101 Primary insomnia: Secondary | ICD-10-CM | POA: Diagnosis not present

## 2019-08-20 DIAGNOSIS — F5101 Primary insomnia: Secondary | ICD-10-CM | POA: Diagnosis not present

## 2019-08-20 DIAGNOSIS — J302 Other seasonal allergic rhinitis: Secondary | ICD-10-CM | POA: Diagnosis not present

## 2019-08-20 DIAGNOSIS — I119 Hypertensive heart disease without heart failure: Secondary | ICD-10-CM | POA: Diagnosis not present

## 2019-08-20 DIAGNOSIS — F419 Anxiety disorder, unspecified: Secondary | ICD-10-CM | POA: Diagnosis not present

## 2019-08-20 DIAGNOSIS — E785 Hyperlipidemia, unspecified: Secondary | ICD-10-CM | POA: Diagnosis not present

## 2019-08-20 DIAGNOSIS — R7303 Prediabetes: Secondary | ICD-10-CM | POA: Diagnosis not present

## 2019-08-20 DIAGNOSIS — I10 Essential (primary) hypertension: Secondary | ICD-10-CM | POA: Diagnosis not present

## 2019-08-20 DIAGNOSIS — Z87891 Personal history of nicotine dependence: Secondary | ICD-10-CM | POA: Diagnosis not present

## 2019-08-20 DIAGNOSIS — R1314 Dysphagia, pharyngoesophageal phase: Secondary | ICD-10-CM | POA: Diagnosis not present

## 2019-09-18 DIAGNOSIS — Z87891 Personal history of nicotine dependence: Secondary | ICD-10-CM | POA: Diagnosis not present

## 2019-09-18 DIAGNOSIS — E785 Hyperlipidemia, unspecified: Secondary | ICD-10-CM | POA: Diagnosis not present

## 2019-09-18 DIAGNOSIS — F5101 Primary insomnia: Secondary | ICD-10-CM | POA: Diagnosis not present

## 2019-09-18 DIAGNOSIS — R1314 Dysphagia, pharyngoesophageal phase: Secondary | ICD-10-CM | POA: Diagnosis not present

## 2019-09-18 DIAGNOSIS — I10 Essential (primary) hypertension: Secondary | ICD-10-CM | POA: Diagnosis not present

## 2019-09-18 DIAGNOSIS — R7303 Prediabetes: Secondary | ICD-10-CM | POA: Diagnosis not present

## 2019-09-18 DIAGNOSIS — I119 Hypertensive heart disease without heart failure: Secondary | ICD-10-CM | POA: Diagnosis not present

## 2019-09-18 DIAGNOSIS — J302 Other seasonal allergic rhinitis: Secondary | ICD-10-CM | POA: Diagnosis not present

## 2019-09-18 DIAGNOSIS — F419 Anxiety disorder, unspecified: Secondary | ICD-10-CM | POA: Diagnosis not present

## 2019-11-05 DIAGNOSIS — F419 Anxiety disorder, unspecified: Secondary | ICD-10-CM | POA: Diagnosis not present

## 2019-11-05 DIAGNOSIS — I10 Essential (primary) hypertension: Secondary | ICD-10-CM | POA: Diagnosis not present

## 2019-11-05 DIAGNOSIS — R1314 Dysphagia, pharyngoesophageal phase: Secondary | ICD-10-CM | POA: Diagnosis not present

## 2019-11-05 DIAGNOSIS — R7303 Prediabetes: Secondary | ICD-10-CM | POA: Diagnosis not present

## 2019-11-05 DIAGNOSIS — J302 Other seasonal allergic rhinitis: Secondary | ICD-10-CM | POA: Diagnosis not present

## 2019-11-05 DIAGNOSIS — I119 Hypertensive heart disease without heart failure: Secondary | ICD-10-CM | POA: Diagnosis not present

## 2019-11-05 DIAGNOSIS — F5101 Primary insomnia: Secondary | ICD-10-CM | POA: Diagnosis not present

## 2019-11-05 DIAGNOSIS — Z87891 Personal history of nicotine dependence: Secondary | ICD-10-CM | POA: Diagnosis not present

## 2019-11-05 DIAGNOSIS — E785 Hyperlipidemia, unspecified: Secondary | ICD-10-CM | POA: Diagnosis not present

## 2019-11-18 DIAGNOSIS — H2513 Age-related nuclear cataract, bilateral: Secondary | ICD-10-CM | POA: Diagnosis not present

## 2019-11-18 DIAGNOSIS — H401113 Primary open-angle glaucoma, right eye, severe stage: Secondary | ICD-10-CM | POA: Diagnosis not present

## 2019-11-18 DIAGNOSIS — H401121 Primary open-angle glaucoma, left eye, mild stage: Secondary | ICD-10-CM | POA: Diagnosis not present

## 2019-11-18 DIAGNOSIS — H353131 Nonexudative age-related macular degeneration, bilateral, early dry stage: Secondary | ICD-10-CM | POA: Diagnosis not present

## 2019-11-22 ENCOUNTER — Emergency Department (HOSPITAL_BASED_OUTPATIENT_CLINIC_OR_DEPARTMENT_OTHER): Payer: Medicare HMO

## 2019-11-22 ENCOUNTER — Encounter (HOSPITAL_BASED_OUTPATIENT_CLINIC_OR_DEPARTMENT_OTHER): Payer: Self-pay | Admitting: *Deleted

## 2019-11-22 ENCOUNTER — Other Ambulatory Visit: Payer: Self-pay

## 2019-11-22 ENCOUNTER — Observation Stay (HOSPITAL_BASED_OUTPATIENT_CLINIC_OR_DEPARTMENT_OTHER)
Admission: EM | Admit: 2019-11-22 | Discharge: 2019-11-24 | Disposition: A | Discharge status: Home / Self Care | Payer: Medicare HMO | Attending: Internal Medicine | Admitting: Internal Medicine

## 2019-11-22 DIAGNOSIS — F17210 Nicotine dependence, cigarettes, uncomplicated: Secondary | ICD-10-CM | POA: Insufficient documentation

## 2019-11-22 DIAGNOSIS — G459 Transient cerebral ischemic attack, unspecified: Principal | ICD-10-CM | POA: Diagnosis present

## 2019-11-22 DIAGNOSIS — Z79899 Other long term (current) drug therapy: Secondary | ICD-10-CM | POA: Insufficient documentation

## 2019-11-22 DIAGNOSIS — I5032 Chronic diastolic (congestive) heart failure: Secondary | ICD-10-CM | POA: Insufficient documentation

## 2019-11-22 DIAGNOSIS — I5033 Acute on chronic diastolic (congestive) heart failure: Secondary | ICD-10-CM

## 2019-11-22 DIAGNOSIS — R482 Apraxia: Secondary | ICD-10-CM | POA: Insufficient documentation

## 2019-11-22 DIAGNOSIS — I712 Thoracic aortic aneurysm, without rupture: Secondary | ICD-10-CM

## 2019-11-22 DIAGNOSIS — I160 Hypertensive urgency: Secondary | ICD-10-CM

## 2019-11-22 DIAGNOSIS — Z20822 Contact with and (suspected) exposure to covid-19: Secondary | ICD-10-CM | POA: Diagnosis not present

## 2019-11-22 DIAGNOSIS — I161 Hypertensive emergency: Secondary | ICD-10-CM

## 2019-11-22 DIAGNOSIS — I719 Aortic aneurysm of unspecified site, without rupture: Secondary | ICD-10-CM | POA: Insufficient documentation

## 2019-11-22 DIAGNOSIS — E785 Hyperlipidemia, unspecified: Secondary | ICD-10-CM | POA: Diagnosis present

## 2019-11-22 DIAGNOSIS — R4701 Aphasia: Secondary | ICD-10-CM | POA: Diagnosis present

## 2019-11-22 DIAGNOSIS — I110 Hypertensive heart disease with heart failure: Secondary | ICD-10-CM | POA: Insufficient documentation

## 2019-11-22 DIAGNOSIS — I729 Aneurysm of unspecified site: Secondary | ICD-10-CM | POA: Diagnosis present

## 2019-11-22 DIAGNOSIS — I10 Essential (primary) hypertension: Secondary | ICD-10-CM | POA: Diagnosis not present

## 2019-11-22 DIAGNOSIS — Z87891 Personal history of nicotine dependence: Secondary | ICD-10-CM | POA: Diagnosis present

## 2019-11-22 DIAGNOSIS — Z720 Tobacco use: Secondary | ICD-10-CM | POA: Diagnosis present

## 2019-11-22 DIAGNOSIS — I7121 Aneurysm of the ascending aorta, without rupture: Secondary | ICD-10-CM

## 2019-11-22 DIAGNOSIS — I119 Hypertensive heart disease without heart failure: Secondary | ICD-10-CM

## 2019-11-22 DIAGNOSIS — R519 Headache, unspecified: Secondary | ICD-10-CM | POA: Diagnosis not present

## 2019-11-22 LAB — URINALYSIS, ROUTINE W REFLEX MICROSCOPIC
Bilirubin Urine: NEGATIVE
Glucose, UA: NEGATIVE mg/dL
Hgb urine dipstick: NEGATIVE
Ketones, ur: NEGATIVE mg/dL
Leukocytes,Ua: NEGATIVE
Nitrite: NEGATIVE
Protein, ur: NEGATIVE mg/dL
Specific Gravity, Urine: 1.01 (ref 1.005–1.030)
pH: 7.5 (ref 5.0–8.0)

## 2019-11-22 LAB — CBC WITH DIFFERENTIAL/PLATELET
Abs Immature Granulocytes: 0.01 10*3/uL (ref 0.00–0.07)
Basophils Absolute: 0 10*3/uL (ref 0.0–0.1)
Basophils Relative: 1 %
Eosinophils Absolute: 0.1 10*3/uL (ref 0.0–0.5)
Eosinophils Relative: 2 %
HCT: 40.2 % (ref 36.0–46.0)
Hemoglobin: 14 g/dL (ref 12.0–15.0)
Immature Granulocytes: 0 %
Lymphocytes Relative: 40 %
Lymphs Abs: 1.6 10*3/uL (ref 0.7–4.0)
MCH: 31.2 pg (ref 26.0–34.0)
MCHC: 34.8 g/dL (ref 30.0–36.0)
MCV: 89.5 fL (ref 80.0–100.0)
Monocytes Absolute: 0.4 10*3/uL (ref 0.1–1.0)
Monocytes Relative: 9 %
Neutro Abs: 2 10*3/uL (ref 1.7–7.7)
Neutrophils Relative %: 48 %
Platelets: 167 10*3/uL (ref 150–400)
RBC: 4.49 MIL/uL (ref 3.87–5.11)
RDW: 12.5 % (ref 11.5–15.5)
WBC: 4.1 10*3/uL (ref 4.0–10.5)
nRBC: 0 % (ref 0.0–0.2)

## 2019-11-22 LAB — COMPREHENSIVE METABOLIC PANEL
ALT: 17 U/L (ref 0–44)
AST: 26 U/L (ref 15–41)
Albumin: 4.2 g/dL (ref 3.5–5.0)
Alkaline Phosphatase: 60 U/L (ref 38–126)
Anion gap: 10 (ref 5–15)
BUN: 17 mg/dL (ref 8–23)
CO2: 26 mmol/L (ref 22–32)
Calcium: 9.5 mg/dL (ref 8.9–10.3)
Chloride: 104 mmol/L (ref 98–111)
Creatinine, Ser: 0.74 mg/dL (ref 0.44–1.00)
GFR calc Af Amer: >60 mL/min (ref >60)
GFR calc non Af Amer: >60 mL/min (ref >60)
Glucose, Bld: 120 mg/dL — ABNORMAL HIGH (ref 70–99)
Potassium: 3.9 mmol/L (ref 3.5–5.1)
Sodium: 140 mmol/L (ref 135–145)
Total Bilirubin: 0.4 mg/dL (ref 0.3–1.2)
Total Protein: 7.4 g/dL (ref 6.5–8.1)

## 2019-11-22 LAB — CBG MONITORING, ED: Glucose-Capillary: 123 mg/dL — ABNORMAL HIGH (ref 70–99)

## 2019-11-22 MED ORDER — DIPHENHYDRAMINE HCL 50 MG/ML IJ SOLN
12.5000 mg | Freq: Once | INTRAMUSCULAR | Status: AC
  Administered 2019-11-22: 12.5 mg via INTRAVENOUS
  Filled 2019-11-22: qty 1

## 2019-11-22 MED ORDER — PROCHLORPERAZINE EDISYLATE 10 MG/2ML IJ SOLN
10.0000 mg | Freq: Once | INTRAMUSCULAR | Status: AC
Start: 2019-11-22 — End: 2019-11-22
  Administered 2019-11-22: 10 mg via INTRAVENOUS
  Filled 2019-11-22: qty 2

## 2019-11-22 MED ORDER — IOHEXOL 350 MG/ML SOLN
100.0000 mL | Freq: Once | INTRAVENOUS | Status: AC | PRN
  Administered 2019-11-22: 100 mL via INTRAVENOUS

## 2019-11-22 NOTE — ED Notes (Signed)
Patient ambulatory to bathroom with steady gait at this time 

## 2019-11-22 NOTE — ED Provider Notes (Signed)
[facility] EMERGENCY DEPARTMENT Provider Note   CSN: [id number] Arrival date & time: 11/22/19  1420     History Chief Complaint  Patient presents with  . Aphasia    Brooke Gray is a 74 y.o. female.  HPI Patient presents to the emergency department with an episode that occurred earlier today where she was unable to speak and had difficulty writing down information while at work.  The patient was next to a coworker who stated that she was having difficulty speaking and was unable to write information that was being spoken to her over the phone.  Patient states that she is had a headache over the last few weeks.  The patient states that nothing seems to make her condition better or worse.  The patient states she still feels off but does not feel like she is having trouble speaking at this time.  The patient denies chest pain, shortness of breath, headache,blurred vision, neck pain, fever, cough, weakness, numbness, dizziness, anorexia, edema, abdominal pain, nausea, vomiting, diarrhea, rash, back pain, dysuria, hematemesis, bloody stool, near syncope, or syncope.    Past Medical History:  Diagnosis Date  . Cardiomegaly   . Glaucoma   . Hypertension     Patient Active Problem List   Diagnosis Date Noted  . TIA (transient ischemic attack) 11/22/2019  . Globus sensation 04/10/2019  . Nonallergic rhinitis 04/10/2019  . Tobacco use 04/10/2019    Past Surgical History:  Procedure Laterality Date  . no past surgery       OB History   No obstetric history on file.     Family History  Problem Relation Age of Onset  . Allergic rhinitis Son   . Angioedema Neg Hx   . Asthma Neg Hx   . Eczema Neg Hx   . Immunodeficiency Neg Hx   . Urticaria Neg Hx     Social History   Tobacco Use  . Smoking status: Current Every Day Smoker    Packs/day: 0.25    Years: 30.00    Pack years: 7.50    Types: Cigarettes  . Smokeless tobacco: Never Used  Substance Use Topics  .  Alcohol use: Yes  . Drug use: No    Home Medications Prior to Admission medications   Medication Sig Start Date End Date Taking? Authorizing Provider  Ascorbic Acid (VITAMIN C) 1000 MG tablet Take 1,000 mg by mouth daily.    [provider]  Carbinoxamine Maleate 4 MG TABS Take 1 tablet every 6-8 hours if needed 04/10/19   Bobbitt, Sedalia Muta, MD  Cholecalciferol (VITAMIN D3 PO) Take 5,000 mcg by mouth.    [provider]  Cyanocobalamin (VITAMIN B-12 PO) Take by mouth. Takes 3 times per wk.    [provider]  diazepam (VALIUM) 5 MG tablet TK 1 T PO BID PRN 07/04/17   [provider]  Flaxseed, Linseed, (FLAX SEED OIL PO) Take by mouth.    [provider]  ipratropium (ATROVENT) 0.06 % nasal spray Use 2 sprays per nostril every 8 hours as needed 05/06/19   Bobbitt, Sedalia Muta, MD  latanoprost (XALATAN) 0.005 % ophthalmic solution INT 1 GTT IN OU HS 03/30/19   [provider]  losartan (COZAAR) 50 MG tablet Take 50 mg by mouth daily. 11/22/19   [provider]  Magnesium 400 MG CAPS Take by mouth.    [provider]  Multiple Vitamins-Minerals (ALIVE ONCE DAILY WOMENS 50+ PO) Take by mouth.  [provider]  OIL OF OREGANO PO Take by mouth.    [provider]  pantoprazole (PROTONIX) 20 MG tablet  07/23/19   [provider]    Allergies    Penicillins  Review of Systems   Review of Systems All other systems negative except as documented in the HPI. All pertinent positives and negatives as reviewed in the HPI. Physical Exam Updated Vital Signs BP (!) 182/112   Pulse 63   Temp 97.8 F (36.6 C) (Oral)   Resp 15   Ht 5\' 3"  (1.6 m)   Wt 59.4 kg   SpO2 96%   BMI 23.21 kg/m   Physical Exam Vitals and nursing note reviewed.  Constitutional:      General: She is not in acute distress.    Appearance: She is well-developed.  HENT:     Head: Normocephalic and atraumatic.  Eyes:      Pupils: Pupils are equal, round, and reactive to light.  Cardiovascular:     Rate and Rhythm: Normal rate and regular rhythm.     Heart sounds: Normal heart sounds. No murmur. No friction rub. No gallop.   Pulmonary:     Effort: Pulmonary effort is normal. No respiratory distress.     Breath sounds: Normal breath sounds. No wheezing.  Abdominal:     General: Bowel sounds are normal. There is no distension.     Palpations: Abdomen is soft.     Tenderness: There is no abdominal tenderness.  Musculoskeletal:     Cervical back: Normal range of motion and neck supple.  Skin:    General: Skin is warm and dry.     Capillary Refill: Capillary refill takes less than 2 seconds.     Findings: No erythema or rash.  Neurological:     Mental Status: She is alert and oriented to person, place, and time.     Motor: No weakness or abnormal muscle tone.     Coordination: Coordination normal.     Gait: Gait normal.  Psychiatric:        Behavior: Behavior normal.     ED Results / Procedures / Treatments   Labs (all labs ordered are listed, but only abnormal results are displayed) Labs Reviewed  COMPREHENSIVE METABOLIC PANEL - Abnormal; Notable for the following components:      Result Value   Glucose, Bld 120 (*)    All other components within normal limits  URINALYSIS, ROUTINE W REFLEX MICROSCOPIC - Abnormal; Notable for the following components:   Color, Urine STRAW (*)    All other components within normal limits  CBG MONITORING, ED - Abnormal; Notable for the following components:   Glucose-Capillary 123 (*)    All other components within normal limits  SARS CORONAVIRUS 2 (TAT 6-24 HRS)  CBC WITH DIFFERENTIAL/PLATELET    EKG EKG Interpretation  Date/Time:  Friday November 22 2019 15:19:29 EST Ventricular Rate:  57 PR Interval:    QRS Duration: 97 QT Interval:  437 QTC Calculation: 426 R Axis:   -13 Text Interpretation: Sinus rhythm Ventricular premature complex Probable left  atrial enlargement Left ventricular hypertrophy Nonspecific T abnormalities, lateral leads occasional PVC but otherwise similar to previous Confirmed by [id number] [phone]) on 11/22/2019 3:51:38 PM   Radiology CT Angio Head W or Wo Contrast  Result Date: 11/22/2019 CLINICAL DATA:  Headache, episode of confusion and memory lapse EXAM: CT ANGIOGRAPHY HEAD AND NECK TECHNIQUE: Multidetector CT imaging of the head and neck was performed using  the standard protocol during bolus administration of intravenous contrast. Multiplanar CT image reconstructions and MIPs were obtained to evaluate the vascular anatomy. Carotid stenosis measurements (when applicable) are obtained utilizing NASCET criteria, using the distal internal carotid diameter as the denominator. CONTRAST:  OMNIPAQUE IOHEXOL 350 MG/ML SOLN COMPARISON:  None. FINDINGS: CTA NECK FINDINGS Aortic arch: Great vessel origins are patent. Measures up to 3.5 cm. Right carotid system: Patent. There is mild calcified and noncalcified plaque at the bifurcation without measurable stenosis. Left carotid system: Patent. There is mild calcified and noncalcified plaque at the bifurcation without measurable stenosis. Vertebral arteries: Patent. Skeleton: Multilevel facet hypertrophy. Other neck: No neck mass or adenopathy. Upper chest: Emphysema. Review of the MIP images confirms the above findings CTA HEAD FINDINGS Anterior circulation: Intracranial internal carotid arteries are patent with calcified plaque causing mild stenosis. There is a 2 mm laterally directed outpouching of the cavernous right ICA. Suspected 1-2 mm inferiorly directed outpouching from the distal supraclinoid left ICA. Anterior and middle cerebral arteries are patent. Posterior circulation: Intracranial vertebral arteries are patent with minor plaque on the left. Basilar artery is patent. Posterior cerebral arteries are patent. Venous sinuses: As permitted by contrast timing, patent. Review of  the MIP images confirms the above findings IMPRESSION: No large vessel occlusion, hemodynamically significant stenosis, or evidence of dissection. 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 Suspected small aneurysm of the distal supraclinoid left ICA. Aortic arch measures up to 3.5 cm. Recommend annual imaging followup by CTA or MRA. This recommendation follows 2010 ACCF/AHA/AATS/ACR/ASA/SCA/SCAI/SIR/STS/SVM Guidelines for the Diagnosis and Management of Patients with Thoracic Aortic Disease. Circulation.2010; 121: O536-U440. Aortic aneurysm NOS (ICD10-I71.9) Electronically Signed   By: Guadlupe Spanish M.D.   On: 11/22/2019 17:51   CT Head Wo Contrast  Result Date: 11/22/2019 CLINICAL DATA:  Headache. Possible intracranial hemorrhage. Sudden onset with speech disturbance and memory loss today. EXAM: CT HEAD WITHOUT CONTRAST TECHNIQUE: Contiguous axial images were obtained from the base of the skull through the vertex without intravenous contrast. COMPARISON:  None. FINDINGS: Brain: Age related atrophy. No focal abnormality seen affecting the brainstem or cerebellum. Cerebral hemispheres show chronic small-vessel ischemic change of the white matter. Old basal ganglia/radiating white matter infarction on the right. No sign of acute or cortically based infarction. No mass lesion, hemorrhage, hydrocephalus or extra-axial collection. Vascular: There is atherosclerotic calcification of the major vessels at the base of the brain. Skull: Negative Sinuses/Orbits: Clear/normal Other: None IMPRESSION: No acute finding by CT. Age related atrophy. Chronic small-vessel ischemic changes of the hemispheric white matter. 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 Electronically Signed   By: Paulina Fusi M.D.   On: 11/22/2019 15:14   CT Angio Neck W and/or Wo Contrast  Result Date: 11/22/2019 CLINICAL DATA:  Headache, episode of confusion and memory lapse EXAM: CT ANGIOGRAPHY HEAD AND NECK  TECHNIQUE: Multidetector CT imaging of the head and neck was performed using the standard protocol during bolus administration of intravenous contrast. Multiplanar CT image reconstructions and MIPs were obtained to evaluate the vascular anatomy. Carotid stenosis measurements (when applicable) are obtained utilizing NASCET criteria, using the distal internal carotid diameter as the denominator. CONTRAST:  OMNIPAQUE IOHEXOL 350 MG/ML SOLN COMPARISON:  None. FINDINGS: CTA NECK FINDINGS Aortic arch: Great vessel origins are patent. Measures up to 3.5 cm. Right carotid system: Patent. There is mild calcified and noncalcified plaque at the bifurcation without measurable stenosis. Left carotid system: Patent. There is mild calcified  and noncalcified plaque at the bifurcation without measurable stenosis. Vertebral arteries: Patent. Skeleton: Multilevel facet hypertrophy. Other neck: No neck mass or adenopathy. Upper chest: Emphysema. Review of the MIP images confirms the above findings CTA HEAD FINDINGS Anterior circulation: Intracranial internal carotid arteries are patent with calcified plaque causing mild stenosis. There is a 2 mm laterally directed outpouching of the cavernous right ICA. Suspected 1-2 mm inferiorly directed outpouching from the distal supraclinoid left ICA. Anterior and middle cerebral arteries are patent. Posterior circulation: Intracranial vertebral arteries are patent with minor plaque on the left. Basilar artery is patent. Posterior cerebral arteries are patent. Venous sinuses: As permitted by contrast timing, patent. Review of the MIP images confirms the above findings IMPRESSION: No large vessel occlusion, hemodynamically significant stenosis, or evidence of dissection. 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 Suspected small aneurysm of the distal supraclinoid left ICA. Aortic arch measures up to 3.5 cm. Recommend annual imaging followup by CTA or MRA. This recommendation follows 2010  ACCF/AHA/AATS/ACR/ASA/SCA/SCAI/SIR/STS/SVM Guidelines for the Diagnosis and Management of Patients with Thoracic Aortic Disease. Circulation.2010; 121: I458-K998. Aortic aneurysm NOS (ICD10-I71.9) Electronically Signed   By: Guadlupe Spanish M.D.   On: 11/22/2019 17:51    Procedures Procedures (including critical care time)  Medications Ordered in ED Medications  iohexol (OMNIPAQUE) 350 MG/ML injection 100 mL (100 mLs Intravenous Contrast Given 11/22/19 1656)  diphenhydrAMINE (BENADRYL) injection 12.5 mg (12.5 mg Intravenous Given 11/22/19 1759)  prochlorperazine (COMPAZINE) injection 10 mg (10 mg Intravenous Given 11/22/19 1759)    ED Course  I have reviewed the triage vital signs and the nursing notes.  Pertinent labs & imaging results that were available during my care of the patient were reviewed by me and considered in my medical decision making (see chart for details).    MDM Rules/Calculators/A&P                     I spoke with the on-call neurologist who advised the patient will need admission to the hospital for TIA and hypertensive urgency work-up.  The patient is given the plan and all questions were answered.  Patient also need MRI per the neurologist recommendations.  Patient has been fairly stable here in the emergency department thus far. Final Clinical Impression(s) / ED Diagnoses Final diagnoses:  TIA (transient ischemic attack)  Hypertensive urgency    Rx / DC Orders ED Discharge Orders    None       Charlestine Night, PA-C 11/22/19 2304    Little, Ambrose Finland, MD 11/23/19 1827

## 2019-11-22 NOTE — Plan of Care (Signed)
74 yo female with hypertension apparently has headache, and dysarthria and difficulty writing, for about 10-15 minutes bp initially 185/115.  ED spoke with Dr. Rozelle Logan and requires TIA w/up,  ddx hypertensive emergency.   CT brain  IMPRESSION: No acute finding by CT.  CTA head. / neck IMPRESSION: No large vessel occlusion, hemodynamically significant stenosis, or evidence of dissection.  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 Suspected small aneurysm of the distal supraclinoid left ICA.

## 2019-11-22 NOTE — ED Triage Notes (Addendum)
Sudden onset of unable to speak and memory loss while at work today. She is alert and oriented on arrival but is having difficulty recalling words. Hx of severe headaches for several weeks.

## 2019-11-23 ENCOUNTER — Observation Stay (HOSPITAL_COMMUNITY): Payer: Medicare HMO

## 2019-11-23 ENCOUNTER — Observation Stay (HOSPITAL_BASED_OUTPATIENT_CLINIC_OR_DEPARTMENT_OTHER): Payer: Medicare HMO

## 2019-11-23 DIAGNOSIS — I340 Nonrheumatic mitral (valve) insufficiency: Secondary | ICD-10-CM

## 2019-11-23 DIAGNOSIS — I160 Hypertensive urgency: Secondary | ICD-10-CM | POA: Diagnosis not present

## 2019-11-23 DIAGNOSIS — I517 Cardiomegaly: Secondary | ICD-10-CM | POA: Diagnosis not present

## 2019-11-23 DIAGNOSIS — I729 Aneurysm of unspecified site: Secondary | ICD-10-CM | POA: Diagnosis not present

## 2019-11-23 DIAGNOSIS — G459 Transient cerebral ischemic attack, unspecified: Secondary | ICD-10-CM | POA: Diagnosis not present

## 2019-11-23 DIAGNOSIS — E785 Hyperlipidemia, unspecified: Secondary | ICD-10-CM

## 2019-11-23 DIAGNOSIS — R519 Headache, unspecified: Secondary | ICD-10-CM | POA: Diagnosis not present

## 2019-11-23 LAB — HEMOGLOBIN A1C
Hgb A1c MFr Bld: 5.5 % (ref 4.8–5.6)
Mean Plasma Glucose: 111.15 mg/dL

## 2019-11-23 LAB — LIPID PANEL
Cholesterol: 232 mg/dL — ABNORMAL HIGH (ref 0–200)
HDL: 77 mg/dL (ref >40)
LDL Cholesterol: 144 mg/dL — ABNORMAL HIGH (ref 0–99)
Total CHOL/HDL Ratio: 3 RATIO
Triglycerides: 55 mg/dL (ref <150)
VLDL: 11 mg/dL (ref 0–40)

## 2019-11-23 LAB — ECHOCARDIOGRAM COMPLETE
Height: 63 in
Weight: 2096 oz

## 2019-11-23 LAB — SARS CORONAVIRUS 2 (TAT 6-24 HRS): SARS Coronavirus 2: NEGATIVE

## 2019-11-23 MED ORDER — ENOXAPARIN SODIUM 40 MG/0.4ML ~~LOC~~ SOLN
40.0000 mg | SUBCUTANEOUS | Status: DC
  Administered 2019-11-23: 40 mg via SUBCUTANEOUS
  Filled 2019-11-23 (×2): qty 0.4

## 2019-11-23 MED ORDER — NITROGLYCERIN IN D5W 200-5 MCG/ML-% IV SOLN
0.0000 ug/min | INTRAVENOUS | Status: DC
  Filled 2019-11-23: qty 250

## 2019-11-23 MED ORDER — ACETAMINOPHEN 160 MG/5ML PO SOLN: 650.0000 mg | ORAL | Status: DC | PRN

## 2019-11-23 MED ORDER — CLOPIDOGREL BISULFATE 75 MG PO TABS
75.0000 mg | ORAL_TABLET | Freq: Every day | ORAL | Status: DC
  Filled 2019-11-23: qty 1

## 2019-11-23 MED ORDER — ACETAMINOPHEN 650 MG RE SUPP: 650.0000 mg | RECTAL | Status: DC | PRN

## 2019-11-23 MED ORDER — ATORVASTATIN CALCIUM 40 MG PO TABS: 40.0000 mg | ORAL_TABLET | Freq: Every day | ORAL | Status: DC

## 2019-11-23 MED ORDER — HYDRALAZINE HCL 20 MG/ML IJ SOLN
10.0000 mg | INTRAMUSCULAR | Status: DC | PRN
  Administered 2019-11-23: 10 mg via INTRAVENOUS

## 2019-11-23 MED ORDER — ASPIRIN 325 MG PO TABS
325.0000 mg | ORAL_TABLET | Freq: Every day | ORAL | Status: DC
  Administered 2019-11-23: 325 mg via ORAL
  Filled 2019-11-23: qty 1

## 2019-11-23 MED ORDER — STROKE: EARLY STAGES OF RECOVERY BOOK
Freq: Once | Status: AC
  Filled 2019-11-23: qty 1

## 2019-11-23 MED ORDER — ASPIRIN EC 81 MG PO TBEC
81.0000 mg | DELAYED_RELEASE_TABLET | Freq: Every day | ORAL | Status: DC
  Filled 2019-11-23: qty 1

## 2019-11-23 MED ORDER — ATORVASTATIN CALCIUM 10 MG PO TABS
20.0000 mg | ORAL_TABLET | Freq: Every day | ORAL | Status: DC
  Administered 2019-11-23: 20 mg via ORAL
  Filled 2019-11-23: qty 2

## 2019-11-23 MED ORDER — ACETAMINOPHEN 325 MG PO TABS
650.0000 mg | ORAL_TABLET | ORAL | Status: DC | PRN
  Administered 2019-11-23 (×2): 650 mg via ORAL
  Filled 2019-11-23 (×3): qty 2

## 2019-11-23 MED ORDER — CLONIDINE HCL 0.1 MG/24HR TD PTWK
0.1000 mg | MEDICATED_PATCH | TRANSDERMAL | Status: DC
  Administered 2019-11-23: 0.1 mg via TRANSDERMAL
  Filled 2019-11-23: qty 1

## 2019-11-23 NOTE — Progress Notes (Signed)
Pt off floor to xray

## 2019-11-23 NOTE — Progress Notes (Signed)
  Echocardiogram 2D Echocardiogram has been performed.  Katrine Radich G Kimani Hovis 11/23/2019, 2:05 PM

## 2019-11-23 NOTE — Evaluation (Signed)
Occupational Therapy Evaluation Patient Details Name: Brooke Gray MRN: [id number] DOB: June 02, 1946 Today's Date: 11/23/2019    History of Present Illness 74 yo female with memory loss and dysarthria. CT reveals small aneurysm R cavernous ICA and suspected small aneurysm of the distal supraclinoid L ICA. PMH glaucoma, cardiomegly, HTN, TIA, globus sensation, Severe HA   Clinical Impression   Patient evaluated by Occupational Therapy with no further acute OT needs identified. All education has been completed and the patient has no further questions. See below for any follow-up Occupational Therapy or equipment needs. OT to sign off. Thank you for referral.   Pt reports missing medications on Wednesday/ Thursday and Friday due to headaches after taking medications. Pt reports only using medication for BP for 1 month and having a holistic doctor that advised her against BP medications due to renal complications.    Follow Up Recommendations  No OT follow up    Equipment Recommendations  None recommended by OT    Recommendations for Other Services       Precautions / Restrictions Precautions Precautions: None Precaution Comments: watch BP Restrictions Weight Bearing Restrictions: No      Mobility Bed Mobility Overal bed mobility: Independent                Transfers Overall transfer level: Independent                    Balance                                           ADL either performed or assessed with clinical judgement   ADL Overall ADL's : Independent                                       General ADL Comments: able to exit bed , don shoes, complete grooming task and toilet transfer     Vision Baseline Vision/History: Wears glasses;Glaucoma Wears Glasses: At all times Vision Assessment?: No apparent visual deficits     Perception     Praxis      Pertinent Vitals/Pain Pain Assessment: No/denies pain      Hand Dominance Right   Extremity/Trunk Assessment Upper Extremity Assessment Upper Extremity Assessment: Overall WFL for tasks assessed   Lower Extremity Assessment Lower Extremity Assessment: Overall WFL for tasks assessed   Cervical / Trunk Assessment Cervical / Trunk Assessment: Normal   Communication Communication Communication: No difficulties   Cognition Arousal/Alertness: Awake/alert Behavior During Therapy: WFL for tasks assessed/performed Overall Cognitive Status: Within Functional Limits for tasks assessed                                     General Comments  BP 151/99 after movement    Exercises     Shoulder Instructions      Home Living Family/patient expects to be discharged to:: Private residence Living Arrangements: Spouse/significant other Available Help at Discharge: Family Type of Home: Apartment Home Access: Stairs to enter     Home Layout: One level     Bathroom Shower/Tub: Chief Strategy Officer: Standard     Home Equipment: None   Additional Comments: works as Education officer, 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 [address]      Prior Functioning/Environment Level of Independence: Independent                 OT Problem List:        OT Treatment/Interventions:      OT Goals(Current goals can be found in the care plan section) Acute Rehab OT Goals Patient Stated Goal: to return to dance  OT Frequency:     Barriers to D/C:            Co-evaluation              AM-PAC OT "6 Clicks" Daily Activity     Outcome Measure Help from another person eating meals?: None Help from another person taking care of personal grooming?: None Help from another person toileting, which includes using toliet, bedpan, or urinal?: None Help from another person bathing (including washing, rinsing, drying)?: None Help from another person to put on and taking off regular upper body clothing?: None Help from another  person to put on and taking off regular lower body clothing?: None 6 Click Score: 24   End of Session Nurse Communication: Mobility status;Precautions  Activity Tolerance: Patient tolerated treatment well Patient left: in chair;with call bell/phone within reach;with chair alarm set  OT Visit Diagnosis: Muscle weakness (generalized) (M62.81)                Time: 1115-1130 OT Time Calculation (min): 15 min Charges:  OT General Charges $OT Visit: 1 Visit OT Evaluation $OT Eval Low Complexity: 1 Low   Brynn, OTR/L  Acute Rehabilitation Services Pager: [phone] Office: [phone] .   Jeri Modena 11/23/2019, 12:56 PM

## 2019-11-23 NOTE — H&P (Signed)
History and Physical    Brooke Gray UJW:[id number] DOB: 1946-10-29 DOA: 11/22/2019  Referring MD/NP/PA: Jani Gravel, MD PCP: Benito Mccreedy, MD  Patient coming from: Transfer from [facility]  Chief Complaint: Difficulty speaking and writing  I have personally briefly reviewed patient's old medical records in [place]   HPI: Brooke Gray is a 74 y.o. female with medical history significant of hypertension and cardiomegaly.  Presents with complaints of difficulty talking and writing yesterday while at work.  Symptoms lasted approximately 10-15 minutes before resolution of symptoms.  She reported that she was able to understand what people were saying to her, but was unable to communicate back.  Her coworker next to her noticed the symptoms as well.  Patient complains of having right temporal headaches since had been taken off of a 3 in 1 combination blood pressure medication and started on losartan 50 mg.  She had initially stopped the combination medication due to possible harmful side effects to her kidneys.  She talked to her primary care provider regarding the headache symptoms, and was advised to take In the morning and half in the evening.  Despite this change patient still reported having headaches.  Also noted to recently start Caco supplement in attempts to try and help with her blood pressures.  She reports that she intermittently misses doses of her losartan because of how it has been making her feel.  Patient quit smoking tobacco approximately 6 months ago but states that her husband still currently smokes.  The patient also would like to be set up with a new primary care provider who specializes in geriatric medicine.  ED Course: On admission to the emergency department patient was noted to be afebrile, pulse 54-70, respirations 18-28, blood pressures elevated up to 211/116, and O2 saturations maintained on room air.  Initial CT showed no acute abnormalities.  Labs are relatively  unremarkable.  Patient was given for headache symptoms.  Case was discussed with Dr. Malen Gauze who recommended admission for TIA/hypertensive urgency work-up.  Patient  Review of Systems  Constitutional: Negative for chills and fever.  HENT: Negative for congestion and nosebleeds.   Eyes: Negative for photophobia and pain.  Respiratory: Negative for cough and shortness of breath.   Cardiovascular: Negative for chest pain and leg swelling.  Gastrointestinal: Negative for diarrhea, nausea and vomiting.  Genitourinary: Negative for hematuria and urgency.  Musculoskeletal: Negative for falls.  Skin: Negative for rash.  Neurological: Positive for speech change and headaches. Negative for loss of consciousness.  Psychiatric/Behavioral: Negative for hallucinations. The patient is not nervous/anxious.     Past Medical History:  Diagnosis Date  . Cardiomegaly   . Glaucoma   . Hypertension     Past Surgical History:  Procedure Laterality Date  . no past surgery       reports that she has been smoking cigarettes. She has a 7.50 pack-year smoking history. She has never used smokeless tobacco. She reports current alcohol use. She reports that she does not use drugs.  Allergies  Allergen Reactions  . Penicillins Hives    Family History  Problem Relation Age of Onset  . Allergic rhinitis Son   . Angioedema Neg Hx   . Asthma Neg Hx   . Eczema Neg Hx   . Immunodeficiency Neg Hx   . Urticaria Neg Hx     Prior to Admission medications   Medication Sig Start Date End Date Taking? Authorizing Provider  Ascorbic Acid (VITAMIN C) 1000 MG tablet Take 1,000  mg by mouth daily.    [provider]  Carbinoxamine Maleate 4 MG TABS Take 1 tablet every 6-8 hours if needed 04/10/19   Bobbitt, Heywood Iles, MD  Cholecalciferol (VITAMIN D3 PO) Take 5,000 mcg by mouth.    [provider]  Cyanocobalamin (VITAMIN B-12 PO) Take by mouth. Takes 3 times per wk.    [provider]    diazepam (VALIUM) 5 MG tablet TK 1 T PO BID PRN 07/04/17   [provider]  Flaxseed, Linseed, (FLAX SEED OIL PO) Take by mouth.    [provider]  ipratropium (ATROVENT) 0.06 % nasal spray Use 2 sprays per nostril every 8 hours as needed 05/06/19   Bobbitt, Heywood Iles, MD  latanoprost (XALATAN) 0.005 % ophthalmic solution INT 1 GTT IN OU HS 03/30/19   [provider]  losartan (COZAAR) 50 MG tablet Take 50 mg by mouth daily. 11/22/19   [provider]  Magnesium 400 MG CAPS Take by mouth.    [provider]  Multiple Vitamins-Minerals (ALIVE ONCE DAILY WOMENS 50+ PO) Take by mouth.    [provider]  OIL OF OREGANO PO Take by mouth.    [provider]  pantoprazole (PROTONIX) 20 MG tablet  07/23/19   [provider]    Physical Exam:  Constitutional: Elderly female who appears to be in no acute distress at this time Vitals:   11/23/19 0452 11/23/19 0721 11/23/19 0900 11/23/19 0903  BP: (!) 175/107 (!) 156/100 (!) 176/106 (!) 163/100  Pulse: 70 62 62 60  Resp: 18 20 (!) 21   Temp: 98 F (36.7 C)  98.1 F (36.7 C)   TempSrc: Oral  Oral   SpO2: 98% 95% 98% 98%  Weight:      Height:       Eyes: PERRL, lids and conjunctivae normal ENMT: Mucous membranes are moist. Posterior pharynx clear of any exudate or lesions.  Neck: normal, supple, no masses, no thyromegaly Respiratory: clear to auscultation bilaterally, no wheezing, no crackles. Normal respiratory effort. No accessory muscle use.  Cardiovascular: Regular rate and rhythm, no murmurs / rubs / gallops. No extremity edema. 2+ pedal pulses. No carotid bruits.  Abdomen: no tenderness, no masses palpated. No hepatosplenomegaly. Bowel sounds positive.  Musculoskeletal: no clubbing / cyanosis. No joint deformity upper and lower extremities. Good ROM, no contractures. Normal muscle tone.  Skin: no rashes, lesions, ulcers. No induration Neurologic: CN 2-12 grossly  intact. Sensation intact, DTR normal. Strength 5/5 in all 4.  Psychiatric: Normal judgment and insight. Alert and oriented x 3. Normal mood.     Labs on Admission: I have personally reviewed following labs and imaging studies  CBC: Recent Labs  Lab 11/22/19 1455  WBC 4.1  NEUTROABS 2.0  HGB 14.0  HCT 40.2  MCV 89.5  PLT 167   Basic Metabolic Panel: Recent Labs  Lab 11/22/19 1455  NA 140  K 3.9  CL 104  CO2 26  GLUCOSE 120*  BUN 17  CREATININE 0.74  CALCIUM 9.5   GFR: Estimated Creatinine Clearance: 53.4 mL/min (by C-G formula based on SCr of 0.74 mg/dL). Liver Function Tests: Recent Labs  Lab 11/22/19 1455  AST 26  ALT 17  ALKPHOS 60  BILITOT 0.4  PROT 7.4  ALBUMIN 4.2   No results for input(s): LIPASE, AMYLASE in the last 168 hours. No results for input(s): AMMONIA in the last 168 hours. Coagulation Profile: No results for input(s): INR, PROTIME in the  last 168 hours. Cardiac Enzymes: No results for input(s): CKTOTAL, CKMB, CKMBINDEX, TROPONINI in the last 168 hours. BNP (last 3 results) No results for input(s): PROBNP in the last 8760 hours. HbA1C: No results for input(s): HGBA1C in the last 72 hours. CBG: Recent Labs  Lab 11/22/19 1429  GLUCAP 123*   Lipid Profile: No results for input(s): CHOL, HDL, LDLCALC, TRIG, CHOLHDL, LDLDIRECT in the last 72 hours. Thyroid Function Tests: No results for input(s): TSH, T4TOTAL, FREET4, T3FREE, THYROIDAB in the last 72 hours. Anemia Panel: No results for input(s): VITAMINB12, FOLATE, FERRITIN, TIBC, IRON, RETICCTPCT in the last 72 hours. Urine analysis:    Component Value Date/Time   COLORURINE STRAW (A) 11/22/2019 2004   APPEARANCEUR CLEAR 11/22/2019 2004   LABSPEC 1.010 11/22/2019 2004   PHURINE 7.5 11/22/2019 2004   GLUCOSEU NEGATIVE 11/22/2019 2004   HGBUR NEGATIVE 11/22/2019 2004   BILIRUBINUR NEGATIVE 11/22/2019 2004   KETONESUR NEGATIVE 11/22/2019 2004   PROTEINUR NEGATIVE 11/22/2019 2004    NITRITE NEGATIVE 11/22/2019 2004   LEUKOCYTESUR NEGATIVE 11/22/2019 2004   Sepsis Labs: No results found for this or any previous visit (from the past 240 hour(s)).   Radiological Exams on Admission: CT Angio Head W or Wo Contrast  Result Date: 11/22/2019 CLINICAL DATA:  Headache, episode of confusion and memory lapse EXAM: CT ANGIOGRAPHY HEAD AND NECK TECHNIQUE: Multidetector CT imaging of the head and neck was performed using the standard protocol during bolus administration of intravenous contrast. Multiplanar CT image reconstructions and MIPs were obtained to evaluate the vascular anatomy. Carotid stenosis measurements (when applicable) are obtained utilizing NASCET criteria, using the distal internal carotid diameter as the denominator. CONTRAST:  100mL OMNIPAQUE IOHEXOL 350 MG/ML SOLN COMPARISON:  None. FINDINGS: CTA NECK FINDINGS Aortic arch: Great vessel origins are patent. Measures up to 3.5 cm. Right carotid system: Patent. There is mild calcified and noncalcified plaque at the bifurcation without measurable stenosis. Left carotid system: Patent. There is mild calcified and noncalcified plaque at the bifurcation without measurable stenosis. Vertebral arteries: Patent. Skeleton: Multilevel facet hypertrophy. Other neck: No neck mass or adenopathy. Upper chest: Emphysema. Review of the MIP images confirms the above findings CTA HEAD FINDINGS Anterior circulation: Intracranial internal carotid arteries are patent with calcified plaque causing mild stenosis. There is a 2 mm laterally directed outpouching of the cavernous right ICA. Suspected 1-2 mm inferiorly directed outpouching from the distal supraclinoid left ICA. Anterior and middle cerebral arteries are patent. Posterior circulation: Intracranial vertebral arteries are patent with minor plaque on the left. Basilar artery is patent. Posterior cerebral arteries are patent. Venous sinuses: As permitted by contrast timing, patent. Review of the MIP  images confirms the above findings IMPRESSION: No large vessel occlusion, hemodynamically significant stenosis, or evidence of dissection. 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 Suspected small aneurysm of the distal supraclinoid left ICA. Aortic arch measures up to 3.5 cm. Recommend annual imaging followup by CTA or MRA. This recommendation follows 2010 ACCF/AHA/AATS/ACR/ASA/SCA/SCAI/SIR/STS/SVM Guidelines for the Diagnosis and Management of Patients with Thoracic Aortic Disease. Circulation.2010; 121: G956-O130: E266-e369. Aortic aneurysm NOS (ICD10-I71.9) Electronically Signed   By: Guadlupe SpanishPraneil  Patel M.D.   On: 11/22/2019 17:51   CT Head Wo Contrast  Result Date: 11/22/2019 CLINICAL DATA:  Headache. Possible intracranial hemorrhage. Sudden onset with speech disturbance and memory loss today. EXAM: CT HEAD WITHOUT CONTRAST TECHNIQUE: Contiguous axial images were obtained from the base of the skull through the vertex without intravenous contrast. COMPARISON:  None. FINDINGS: Brain: Age related atrophy.  No focal abnormality seen affecting the brainstem or cerebellum. Cerebral hemispheres show chronic small-vessel ischemic change of the white matter. Old basal ganglia/radiating white matter infarction on the right. No sign of acute or cortically based infarction. No mass lesion, hemorrhage, hydrocephalus or extra-axial collection. Vascular: There is atherosclerotic calcification of the major vessels at the base of the brain. Skull: Negative Sinuses/Orbits: Clear/normal Other: None IMPRESSION: No acute finding by CT. Age related atrophy. Chronic small-vessel ischemic changes of the hemispheric white matter. 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 Electronically Signed   By: Paulina Fusi M.D.   On: 11/22/2019 15:14   CT Angio Neck W and/or Wo Contrast  Result Date: 11/22/2019 CLINICAL DATA:  Headache, episode of confusion and memory lapse EXAM: CT ANGIOGRAPHY HEAD AND NECK TECHNIQUE:  Multidetector CT imaging of the head and neck was performed using the standard protocol during bolus administration of intravenous contrast. Multiplanar CT image reconstructions and MIPs were obtained to evaluate the vascular anatomy. Carotid stenosis measurements (when applicable) are obtained utilizing NASCET criteria, using the distal internal carotid diameter as the denominator. CONTRAST:  OMNIPAQUE IOHEXOL 350 MG/ML SOLN COMPARISON:  None. FINDINGS: CTA NECK FINDINGS Aortic arch: Great vessel origins are patent. Measures up to 3.5 cm. Right carotid system: Patent. There is mild calcified and noncalcified plaque at the bifurcation without measurable stenosis. Left carotid system: Patent. There is mild calcified and noncalcified plaque at the bifurcation without measurable stenosis. Vertebral arteries: Patent. Skeleton: Multilevel facet hypertrophy. Other neck: No neck mass or adenopathy. Upper chest: Emphysema. Review of the MIP images confirms the above findings CTA HEAD FINDINGS Anterior circulation: Intracranial internal carotid arteries are patent with calcified plaque causing mild stenosis. There is a 2 mm laterally directed outpouching of the cavernous right ICA. Suspected 1-2 mm inferiorly directed outpouching from the distal supraclinoid left ICA. Anterior and middle cerebral arteries are patent. Posterior circulation: Intracranial vertebral arteries are patent with minor plaque on the left. Basilar artery is patent. Posterior cerebral arteries are patent. Venous sinuses: As permitted by contrast timing, patent. Review of the MIP images confirms the above findings IMPRESSION: No large vessel occlusion, hemodynamically significant stenosis, or evidence of dissection. 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 Suspected small aneurysm of the distal supraclinoid left ICA. Aortic arch measures up to 3.5 cm. Recommend annual imaging followup by CTA or MRA. This recommendation follows 2010  ACCF/AHA/AATS/ACR/ASA/SCA/SCAI/SIR/STS/SVM Guidelines for the Diagnosis and Management of Patients with Thoracic Aortic Disease. Circulation.2010; 121: W237-S283. Aortic aneurysm NOS (ICD10-I71.9) Electronically Signed   By: Guadlupe Spanish M.D.   On: 11/22/2019 17:51    EKG: Independently reviewed.  Sinus bradycardia at 57 bpm with intermittent PVCs  Assessment/Plan Dysarthria and apraxia suspected secondary to suspected TIA: Symptoms have since resolved.  Work-up including CT/CTA of the head and neck did not show any large vessel occlusion or signs of acute stroke.  She did have some incidental findings as seen below.  Neurology evaluated the patient and suspect symptoms likely secondary to TIA more so than hypertensive urgency. -Admit to a telemetry bed -Neurochecks - F/u [id number] (5.5) -MRI (did not reveal any acute signs of stroke) -Follow-up echocardiogram (EF noted to be 60 to 65% with grade 1 diastolic dysfunction) -Aspirin 325 mg daily -Follow-up telemetry overnight  Hypertensive urgency: Blood pressures initially noted to be elevated up to 211/116.  Patient reports being on multiple blood pressure medications in the past.  Currently home medications include losartan 50 mg  daily, but she believes she has been experiencing headaches due to this.  She requests to be a simple regimen with minimal side effects.  Of note her headaches could be secondary to significantly elevated blood pressures related with the medication. -Discontinue losartan due to headaches -Start clonidine patch -Hydralazine IV as needed  Hyperlipidemia: Total cholesterol was noted to be elevated at 232 with LDL 144.  Patient not on any cholesterol-lowering medicines at home.  Goal LDH less than 70. -Atorvastatin 20 mg daily -Recommend patient to take coenzyme 50 in the outpatient setting  Aneurysms: Patient incidentally noted small aneurysm of the right cavernous ICA, distal supraclinoid left ICA, and aortic arch  measures up to 3.5 cm on CT angiogram of the head neck. -Continue outpatient surveillance  Remote tobacco use: Patient reports quitting smoking 6 months ago.  Still secondary exposure as her husband continues to smoke. -Encouraged continued cessation of tobacco use  DVT prophylaxis: Lovenox Code Status: Full Family Communication: No family present at bedside Disposition Plan: Possible discharge home in a.m. Consults called: Neurology Admission status: Observation  Clydie Braun MD [facility] Pager [phone]   If 7PM-7AM, please contact night-coverage [email or web address] Password [facility]  11/23/2019, 9:24 AM

## 2019-11-23 NOTE — Consult Note (Addendum)
NEURO HOSPITALIST  CONSULT   Requesting Physician: Dr. Tamala Julian    Chief Complaint: speech problem/ memory loss  History obtained from:  Patient  / Chart   HPI:                                                                                                                                         Brooke Gray is an 74 y.o. female  With PMH HTN, cardiomegaly who presented to [facility] on 11/22/2019 with c/o speech difficulty and memory loss. Transferred to [facility] for complete stroke work-up.   Patient was at work yesterday and had a sudden onset of trouble speaking and she could also not write down information. She stated that she could understand what people were saying to her, but that she could then not answer or get anything out. This episode lasted about 10-15 minutes and then she returned to normal. Patient does report a HA over the past 15 days. Her BP medication losartan was recently increased from 25 to 50 mcg and she says that since that time she has had an intermittent HA. She is able to take tylenol to relieve the HA. The tylenol just gives her HA relief for a longer period of time, but the HA does return. Denies any CP, fever/chills, cough, weakness, numbness, dizziness.  Endorses smoking 3 cigarettes per day which is a decrease on her way to stop smoking. Denies any ETOH or drug use.   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 Cardiomegaly   . Glaucoma   . Hypertension     Past Surgical History:  Procedure Laterality Date  . no past surgery      Family History  Problem Relation Age of Onset  . Allergic rhinitis Son   . Angioedema Neg Hx   . Asthma Neg Hx   . Eczema Neg Hx   . Immunodeficiency Neg Hx   . Urticaria Neg Hx         Social  History:  reports that she has been smoking cigarettes. She has a 7.50 pack-year smoking history. She has never used smokeless tobacco. She reports current alcohol use. She reports that she does not use drugs.  Allergies:  Allergies  Allergen Reactions  . Penicillins Hives    Medications:  Prior to Admission:  Medications Prior to Admission  Medication Sig Dispense Refill Last Dose  . Ascorbic Acid (VITAMIN C) 1000 MG tablet Take 1,000 mg by mouth daily.   11/21/2019  . Cholecalciferol (VITAMIN D3 PO) Take 5,000 mcg by mouth daily.    11/22/2019 at Unknown time  . Cyanocobalamin (VITAMIN B-12 PO) Take 1 tablet by mouth 3 (three) times a week.    11/22/2019 at Unknown time  . diazepam (VALIUM) 5 MG tablet Take 5 mg by mouth every 12 (twelve) hours as needed for muscle spasms (sleep).    11/20/2019 at Unknown time  . latanoprost (XALATAN) 0.005 % ophthalmic solution Place 1 drop into both eyes at bedtime.    11/21/2019  . losartan (COZAAR) 50 MG tablet Take 25 mg by mouth 2 (two) times daily.    11/20/2019 at Unknown time  . Magnesium 400 MG CAPS Take 400 mg by mouth daily.    11/21/2019  . zinc sulfate (ZINC-220) 220 (50 Zn) MG capsule Take 220 mg by mouth daily.   11/22/2019 at Unknown time  . Carbinoxamine Maleate 4 MG TABS Take 1 tablet every 6-8 hours if needed (Patient taking differently: Take 4 mg by mouth every 6 (six) hours as needed (Allergies). ) 90 each 5   . Flaxseed, Linseed, (FLAX SEED OIL PO) Take 1 tablet by mouth daily.      Marland Kitchen ipratropium (ATROVENT) 0.06 % nasal spray Use 2 sprays per nostril every 8 hours as needed (Patient taking differently: Place 2 sprays into both nostrils 4 (four) times daily as needed for rhinitis. ) 15 mL 5    Scheduled: . enoxaparin (LOVENOX) injection  40 mg Subcutaneous Q24H   Continuous:    ROS:                                                                                                                                        ROS was performed and is negative except as noted in HPI    General Examination:                                                                                                      Blood pressure (!) 163/100, pulse 60, temperature 98.1 F (36.7 C), temperature source Oral, resp. rate (!) 21, height 5\' 3"  (1.6 m), weight 59.4 kg, SpO2 98 %.  Physical Exam  Constitutional: Appears well-developed and well-nourished.  Psych: Affect appropriate to situation Eyes: Normal external eye and conjunctiva. HENT:  Normocephalic, no lesions, without obvious abnormality.   Musculoskeletal-no joint tenderness, deformity or swelling Cardiovascular: Normal rate and regular rhythm.  Respiratory: Effort normal, non-labored breathing saturations WNL GI: Soft.  No distension. There is no tenderness.  Skin: WDI  Neurological Examination Mental Status: Alert, oriented, thought content appropriate.  Speech fluent without evidence of aphasia. naming intact. Able to follow  commands without difficulty. Cranial Nerves: II:  Visual fields grossly normal,  III,IV, VI: ptosis not present, extra-ocular motions intact bilaterally, pupils equal, round, reactive to light and accommodation V,VII: smile symmetric, facial light touch sensation normal bilaterally VIII: hearing normal bilaterally IX,X: uvula rises midline XI: bilateral shoulder shrug XII: midline tongue extension Motor: Right : Upper extremity   5/5  Left:     Upper extremity   5/5  Lower extremity   5/5   Lower extremity   5/5 Tone and bulk:normal tone throughout; no atrophy noted Sensory:  light touch intact throughout, bilaterally Deep Tendon Reflexes: 2+ and symmetric biceps and patella Cerebellar: No ataxia Gait: deferred   Lab Results: Basic Metabolic Panel: Recent Labs  Lab 11/22/19 1455  NA 140  K 3.9  CL 104  CO2 26  GLUCOSE 120*  BUN 17   CREATININE 0.74  CALCIUM 9.5    CBC: Recent Labs  Lab 11/22/19 1455  WBC 4.1  NEUTROABS 2.0  HGB 14.0  HCT 40.2  MCV 89.5  PLT 167   CBG: Recent Labs  Lab 11/22/19 1429  GLUCAP 123*    Imaging: CT Angio Head W or Wo Contrast  Result Date: 11/22/2019 CLINICAL DATA:  Headache, episode of confusion and memory lapse EXAM: CT ANGIOGRAPHY HEAD AND NECK TECHNIQUE: Multidetector CT imaging of the head and neck was performed using the standard protocol during bolus administration of intravenous contrast. Multiplanar CT image reconstructions and MIPs were obtained to evaluate the vascular anatomy. Carotid stenosis measurements (when applicable) are obtained utilizing NASCET criteria, using the distal internal carotid diameter as the denominator. CONTRAST:  OMNIPAQUE IOHEXOL 350 MG/ML SOLN COMPARISON:  None. FINDINGS: CTA NECK FINDINGS Aortic arch: Great vessel origins are patent. Measures up to 3.5 cm. Right carotid system: Patent. There is mild calcified and noncalcified plaque at the bifurcation without measurable stenosis. Left carotid system: Patent. There is mild calcified and noncalcified plaque at the bifurcation without measurable stenosis. Vertebral arteries: Patent. Skeleton: Multilevel facet hypertrophy. Other neck: No neck mass or adenopathy. Upper chest: Emphysema. Review of the MIP images confirms the above findings CTA HEAD FINDINGS Anterior circulation: Intracranial internal carotid arteries are patent with calcified plaque causing mild stenosis. There is a 2 mm laterally directed outpouching of the cavernous right ICA. Suspected 1-2 mm inferiorly directed outpouching from the distal supraclinoid left ICA. Anterior and middle cerebral arteries are patent. Posterior circulation: Intracranial vertebral arteries are patent with minor plaque on the left. Basilar artery is patent. Posterior cerebral arteries are patent. Venous sinuses: As permitted by contrast timing, patent. Review  of the MIP images confirms the above findings IMPRESSION: No large vessel occlusion, hemodynamically significant stenosis, or evidence of dissection. 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 Suspected small aneurysm of the distal supraclinoid left ICA. Aortic arch measures up to 3.5 cm. Recommend annual imaging followup by CTA or MRA. This recommendation follows 2010 ACCF/AHA/AATS/ACR/ASA/SCA/SCAI/SIR/STS/SVM Guidelines for the Diagnosis and Management of Patients with Thoracic Aortic Disease. Circulation.2010; 121: A076-A263. Aortic aneurysm NOS (ICD10-I71.9) Electronically Signed   By: Guadlupe Spanish M.D.   On: 11/22/2019 17:51   CT  Head Wo Contrast  Result Date: 11/22/2019 CLINICAL DATA:  Headache. Possible intracranial hemorrhage. Sudden onset with speech disturbance and memory loss today. EXAM: CT HEAD WITHOUT CONTRAST TECHNIQUE: Contiguous axial images were obtained from the base of the skull through the vertex without intravenous contrast. COMPARISON:  None. FINDINGS: Brain: Age related atrophy. No focal abnormality seen affecting the brainstem or cerebellum. Cerebral hemispheres show chronic small-vessel ischemic change of the white matter. Old basal ganglia/radiating white matter infarction on the right. No sign of acute or cortically based infarction. No mass lesion, hemorrhage, hydrocephalus or extra-axial collection. Vascular: There is atherosclerotic calcification of the major vessels at the base of the brain. Skull: Negative Sinuses/Orbits: Clear/normal Other: None IMPRESSION: No acute finding by CT. Age related atrophy. Chronic small-vessel ischemic changes of the hemispheric white matter. 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 Electronically Signed   By: Paulina Fusi M.D.   On: 11/22/2019 15:14   CT Angio Neck W and/or Wo Contrast  Result Date: 11/22/2019 CLINICAL DATA:  Headache, episode of confusion and memory lapse EXAM: CT ANGIOGRAPHY HEAD AND NECK  TECHNIQUE: Multidetector CT imaging of the head and neck was performed using the standard protocol during bolus administration of intravenous contrast. Multiplanar CT image reconstructions and MIPs were obtained to evaluate the vascular anatomy. Carotid stenosis measurements (when applicable) are obtained utilizing NASCET criteria, using the distal internal carotid diameter as the denominator. CONTRAST:  OMNIPAQUE IOHEXOL 350 MG/ML SOLN COMPARISON:  None. FINDINGS: CTA NECK FINDINGS Aortic arch: Great vessel origins are patent. Measures up to 3.5 cm. Right carotid system: Patent. There is mild calcified and noncalcified plaque at the bifurcation without measurable stenosis. Left carotid system: Patent. There is mild calcified and noncalcified plaque at the bifurcation without measurable stenosis. Vertebral arteries: Patent. Skeleton: Multilevel facet hypertrophy. Other neck: No neck mass or adenopathy. Upper chest: Emphysema. Review of the MIP images confirms the above findings CTA HEAD FINDINGS Anterior circulation: Intracranial internal carotid arteries are patent with calcified plaque causing mild stenosis. There is a 2 mm laterally directed outpouching of the cavernous right ICA. Suspected 1-2 mm inferiorly directed outpouching from the distal supraclinoid left ICA. Anterior and middle cerebral arteries are patent. Posterior circulation: Intracranial vertebral arteries are patent with minor plaque on the left. Basilar artery is patent. Posterior cerebral arteries are patent. Venous sinuses: As permitted by contrast timing, patent. Review of the MIP images confirms the above findings IMPRESSION: No large vessel occlusion, hemodynamically significant stenosis, or evidence of dissection. 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 Suspected small aneurysm of the distal supraclinoid left ICA. Aortic arch measures up to 3.5 cm. Recommend annual imaging followup by CTA or MRA. This recommendation follows 2010  ACCF/AHA/AATS/ACR/ASA/SCA/SCAI/SIR/STS/SVM Guidelines for the Diagnosis and Management of Patients with Thoracic Aortic Disease. Circulation.2010; 121: S568-L275. Aortic aneurysm NOS (ICD10-I71.9) Electronically Signed   By: Guadlupe Spanish M.D.   On: 11/22/2019 17:51       Valentina Lucks, MSN, NP-C [facility] [phone]  11/23/2019, 11:22 AM   Attending physician note to follow with Assessment and plan .   Assessment: 74 y.o. female  With PMH HTN, cardiomegaly who presented to [facility] on 11/22/2019 with c/o speech difficulty and memory loss. Transferred to [facility] for complete stroke work-up. The [facility] was negative for hemorrhage and the CTA was negative for an LVO, shows small right cavernous ICA aneurysm and a small distal supraclinoid left ICA aneurysm. The MRI was negative for acute infarct, show  significant chronic white matter disease.  Given the exam and history this was most likely a TIA. ECHO completed, results pending LDL was 141, will start statin.  Hgba1c was WNL. Stroke Risk Factors - hypertension   Transient ischemic attack/Hypertensive emergency Incidental small aneurysms in bilateral ICA    Recommendations:  --Echocardiogram: no thrombus, dilated LA --  ASA 325mg  daily -- start atorvastatin 20 mg daily, coenzyme Q 10 (low-dose of statin due to history of muscle cramps)  --Telemetry monitoring, 30 day holter on discharge --Frequent neuro checks --Stroke swallow screen  - smoking cessation -BP goal less than 140/90 mmHg  --please page stroke NP  Or  PA  Or MD from 8am -4 pm  as this patient from this time will be  followed by the stroke.   You can look them up on [email or web address]  Password [email or web address]   NEUROHOSPITALIST ADDENDUM Performed a face to face diagnostic evaluation.   I have reviewed the contents of history and physical exam as documented by PA/ARNP/Resident and agree with above documentation.  I have discussed and formulated the above plan as documented. Edits  to the note have been made as needed.  [facility] with sudden onset difficulty talking while on the phone.  Patient states that it lasted 3- 5 minutes, had trouble thinking of the names and numbers.  Symptoms improved.  Her blood pressure significantly elevated, greater than 180 systolic.  She denies any facial droop, slurred speech, weakness in arm or leg.  Denies headache, was feeling perfectly normal before.  She is currently back to her baseline.  She admits that her blood pressure has not been under control, also smokes.  MRI brain negative for acute stroke however show significant chronic white matter disease.  CTA negative for any significant stenosis, shows incidental aneurysms that need to be watched.   Recommendations -As stated above -We will hold off starting dual antiplatelet therapy, with patient's history of uncontrolled hypertension -Aspirin 325 mg, low-dose statin due to muscle intolerance -Better blood pressure management -Outpatient follow-up in stroke clinic in 4 weeks, [facility] -Patient also requests PCP referral   Neurology will be available as needed   Georgiana SpinnerSushanth Phinneas Shakoor MD [facility] [phone]   If 7pm to 7am, please call on call as listed on AMION.

## 2019-11-23 NOTE — Care Management Obs Status (Signed)
MEDICARE OBSERVATION STATUS NOTIFICATION   Patient Details  Name: Teriann Livingood MRN: [id number] Date of Birth: 1946-03-07   Medicare Observation Status Notification Given:  Yes    Deveron Furlong, RN 11/23/2019, 5:06 PM

## 2019-11-23 NOTE — Evaluation (Signed)
Physical Physical Therapy Evaluation and DISCHARGE Patient Details Name: Brooke Gray MRN: [id number] DOB: [birth year] Today's Date: 11/23/2019   History of Present Illness  74 yo female with memory loss and dysarthria. CT reveals small aneurysm R cavernous ICA and suspected small aneurysm of the distal supraclinoid L ICA. PMH glaucoma, cardiomegly, HTN, TIA, globus sensation, Severe HA    Clinical Impression  Pt admitted with above. Pt presenting at baseline level of function with no residual deficits. Pt with minimal falls risk as demonstrated with score of 24/24 on DGI. Pt with no further acute PT needs at this time. ACUTE PT SIGNING OFF. Please re-consult if needed in future.    Follow Up Recommendations No PT follow up    Equipment Recommendations  None recommended by PT    Recommendations for Other Services       Precautions / Restrictions Precautions Precautions: None Precaution Comments: watch BP Restrictions Weight Bearing Restrictions: No      Mobility  Bed Mobility Overal bed mobility: Independent                Transfers Overall transfer level: Independent Equipment used: None             General transfer comment: no difficulty, steady  Ambulation/Gait Ambulation/Gait assistance: Independent Gait Distance (Feet): 400 Feet Assistive device: None Gait Pattern/deviations: WFL(Within Functional Limits)     General Gait Details: no episodes of LOB, fluid gait pattern  Stairs Stairs: Yes Stairs assistance: Independent Stair Management: No rails Number of Stairs: 12 General stair comments: no use of hand rail, reciprocal, no LOB  Wheelchair Mobility    Modified Rankin (Stroke Patients Only) Modified Rankin (Stroke Patients Only) Pre-Morbid Rankin Score: No symptoms Modified Rankin: No symptoms     Balance Overall balance assessment: Modified Independent                               Standardized Balance  Assessment Standardized Balance Assessment : Dynamic Gait Index   Dynamic Gait Index Level Surface: Normal Change in Gait Speed: Normal Gait with Horizontal Head Turns: Normal Gait with Vertical Head Turns: Normal Gait and Pivot Turn: Normal Step Over Obstacle: Normal Step Around Obstacles: Normal Steps: Normal Total Score: 24       Pertinent Vitals/Pain Pain Assessment: No/denies pain    Home Living Family/patient expects to be discharged to:: Private residence Living Arrangements: Spouse/significant other Available Help at Discharge: Family Type of Home: Apartment Home Access: Stairs to enter Entrance Stairs-Rails: Right Entrance Stairs-Number of Steps: flight Home Layout: One level Home Equipment: None Additional Comments: works as Education officer, 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 [employer]    Prior Function Level of Independence: Independent               Hand Dominance   Dominant Hand: Right    Extremity/Trunk Assessment   Upper Extremity Assessment Upper Extremity Assessment: Overall WFL for tasks assessed    Lower Extremity Assessment Lower Extremity Assessment: Overall WFL for tasks assessed    Cervical / Trunk Assessment Cervical / Trunk Assessment: Normal  Communication   Communication: No difficulties  Cognition Arousal/Alertness: Awake/alert Behavior During Therapy: WFL for tasks assessed/performed Overall Cognitive Status: Within Functional Limits for tasks assessed  General Comments General comments (skin integrity, edema, etc.): BP 151/99 after movement    Exercises     Assessment/Plan    PT Assessment Patent does not need any further PT services  PT Problem List Decreased mobility;Decreased activity tolerance       PT Treatment Interventions      PT Goals (Current goals can be found in the Care Plan section)  Acute Rehab PT Goals Patient Stated Goal: to return to  dance PT Goal Formulation: All assessment and education complete, DC therapy    Frequency     Barriers to discharge        Co-evaluation               AM-PAC PT "6 Clicks" Mobility  Outcome Measure Help needed turning from your back to your side while in a flat bed without using bedrails?: None Help needed moving from lying on your back to sitting on the side of a flat bed without using bedrails?: None Help needed moving to and from a bed to a chair (including a wheelchair)?: None Help needed standing up from a chair using your arms (e.g., wheelchair or bedside chair)?: None Help needed to walk in hospital room?: None Help needed climbing 3-5 steps with a railing? : None 6 Click Score: 24    End of Session Equipment Utilized During Treatment: Gait belt Activity Tolerance: Patient tolerated treatment well Patient left: Other (comment)(at sink with OT) Nurse Communication: Mobility status PT Visit Diagnosis: Other symptoms and signs involving the nervous system (F16.384)    Time: 6659-9357 PT Time Calculation (min) (ACUTE ONLY): 20 min   Charges:   PT Evaluation $PT Eval Low Complexity: 1 Low          Kittie Plater, PT, DPT Acute Rehabilitation Services Pager #: [phone] Office #: [phone]   Berline Lopes 11/23/2019, 1:05 PM

## 2019-11-24 ENCOUNTER — Other Ambulatory Visit: Payer: Self-pay | Admitting: Neurology

## 2019-11-24 ENCOUNTER — Other Ambulatory Visit: Payer: Self-pay | Admitting: Physician Assistant

## 2019-11-24 DIAGNOSIS — I5032 Chronic diastolic (congestive) heart failure: Secondary | ICD-10-CM

## 2019-11-24 DIAGNOSIS — I712 Thoracic aortic aneurysm, without rupture: Secondary | ICD-10-CM

## 2019-11-24 DIAGNOSIS — Z720 Tobacco use: Secondary | ICD-10-CM | POA: Diagnosis not present

## 2019-11-24 DIAGNOSIS — G459 Transient cerebral ischemic attack, unspecified: Secondary | ICD-10-CM

## 2019-11-24 DIAGNOSIS — I119 Hypertensive heart disease without heart failure: Secondary | ICD-10-CM | POA: Diagnosis not present

## 2019-11-24 DIAGNOSIS — I7121 Aneurysm of the ascending aorta, without rupture: Secondary | ICD-10-CM

## 2019-11-24 DIAGNOSIS — I161 Hypertensive emergency: Secondary | ICD-10-CM | POA: Diagnosis not present

## 2019-11-24 DIAGNOSIS — I5033 Acute on chronic diastolic (congestive) heart failure: Secondary | ICD-10-CM

## 2019-11-24 DIAGNOSIS — I160 Hypertensive urgency: Secondary | ICD-10-CM | POA: Diagnosis not present

## 2019-11-24 DIAGNOSIS — E785 Hyperlipidemia, unspecified: Secondary | ICD-10-CM | POA: Diagnosis not present

## 2019-11-24 MED ORDER — ATORVASTATIN CALCIUM 40 MG PO TABS: 40.0000 mg | ORAL_TABLET | Freq: Every day | ORAL | 0 refills | Status: DC

## 2019-11-24 MED ORDER — ACETAMINOPHEN 325 MG PO TABS: 650.0000 mg | ORAL_TABLET | ORAL | Status: DC | PRN

## 2019-11-24 MED ORDER — BUTALBITAL-APAP-CAFFEINE 50-325-40 MG PO TABS: 1.0000 | ORAL_TABLET | Freq: Four times a day (QID) | ORAL | 0 refills | Status: DC | PRN

## 2019-11-24 MED ORDER — CLONIDINE 0.1 MG/24HR TD PTWK: 0.1000 mg | MEDICATED_PATCH | TRANSDERMAL | 12 refills | Status: DC

## 2019-11-24 MED ORDER — ASPIRIN 81 MG PO TBEC: 81.0000 mg | DELAYED_RELEASE_TABLET | Freq: Every day | ORAL | 0 refills | Status: DC

## 2019-11-24 MED ORDER — BUTALBITAL-APAP-CAFFEINE 50-325-40 MG PO TABS
2.0000 | ORAL_TABLET | Freq: Once | ORAL | Status: AC
  Administered 2019-11-24: 2 via ORAL
  Filled 2019-11-24 (×2): qty 2

## 2019-11-24 MED ORDER — ASPIRIN EC 325 MG PO TBEC: 325.0000 mg | DELAYED_RELEASE_TABLET | Freq: Every day | ORAL | 3 refills | Status: DC

## 2019-11-24 MED ORDER — CLOPIDOGREL BISULFATE 75 MG PO TABS: 75.0000 mg | ORAL_TABLET | Freq: Every day | ORAL | 0 refills | Status: DC

## 2019-11-24 NOTE — Plan of Care (Signed)
Patient was discharged before being seen, by did discuss with Dr. Butler Denmark about her case.  As per chart, patient 74 year old female with history of hypertension, cardiomyopathy, former smoker admitted for speech difficulty, word difficulty for 10 to 15 minutes and then resolved.  CT showed old right BG infarct.  CT head and neck unremarkable.  MRI no acute infarct.  EF 60 to 65%.  A1c 5.5 and LDL 144.  Given her symptoms concerning for cortical TIA, recommend 30-day CardioNet monitoring as outpatient to rule out A. fib.  She was put on aspirin 81 and Plavix 75 DAPT for 3 weeks and then aspirin alone.  Also put on Lipitor 40 for hyperlipidemia treatment.  Encourage patient to continue abstaining from smoking.  We will set up follow-up with [facility] in 4 weeks.  Marvel Plan, MD PhD Stroke Neurology 11/24/2019 3:11 PM

## 2019-11-24 NOTE — Care Management (Signed)
Consult for geriatric PCP. Information added to AVS

## 2019-11-24 NOTE — Discharge Summary (Signed)
Physician Discharge Summary  Brooke Gray AXK:[id number] DOB: 11-07-1946 DOA: 11/22/2019  PCP: Jackie Plum, MD  Admit date: 11/22/2019 Discharge date: 11/24/2019  Admitted From: home   Disposition:  home   Recommendations for Outpatient Follow-up:  1. Will need event monitor 2. Needs outpt cardiac MRI 3. Please f/u on AAA- see below 4. F/u on headaches 5. Encourage smoking cessation  Home Health: none Equipment/Devices:  none    Discharge Condition:  stable   CODE STATUS:  Full code   Diet recommendation:  Heart healthy Consultations:  neurology    Discharge Diagnoses:  Principal Problem:   TIA (transient ischemic attack) Active Problems:   LVH (left ventricular hypertrophy) due to hypertensive disease, without heart failure   Hypertensive emergency   Tobacco use   Hyperlipemia   Ascending aortic aneurysm (HCC)   Chronic diastolic CHF (congestive heart failure) (HCC)    Brief Summary: Brooke Gray is a 74 y.o. female with medical history significant of hypertension and cardiomegaly.  Presents with complaints of difficulty talking and writing yesterday while at work.  Symptoms lasted approximately 10-15 minutes before resolution of symptoms.  She reported that she was able to understand what people were saying to her, but was unable to communicate back.  Her coworker next to her noticed the symptoms as well.  Patient complains of having right temporal headaches which she related to losartan being increased from 25 to 50 mg.  She has been using Tylenol for the headaches.     Also noted to recently start Caco supplement in attempts to try and help with her blood pressures.  She reports that she intermittently misses doses of her losartan because of how it has been making her feel.  Patient quit smoking tobacco approximately 6 months ago but states that her husband still currently smokes and so she smokes occasionally.   The patient also would like to be set up with a  new primary care provider who specializes in geriatric medicine.   In ED > BP 211/116 - CTA head/neck> No large vessel occlusion, hemodynamically significant stenosis, or evidence of dissection.  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 Suspected small aneurysm of the distal supraclinoid left ICA.  Aortic arch measures up to 3.5 cm. Recommend annual imaging followup by CTA or MRA.  MRI> unremarkable other than moderately advanced chronic small vessel ischemic disease.  Neuro consulted in ED  Hospital Course:   TIA with hypertensive emergency with dysarthria and apraxia HLD - Stroke team, Dr Roda Shutters ( I spoke with him today) recommends today to start ASA 81 mg and Plavix 75 mg for three weeks and then ASA alone, Lipitor, 30 day event monitor and f/u with Dr Pearlean Brownie in 4 wks which he will arrange - LDL 114, Chol 232 - A1c 5.5 - I have sent a message to [facility] for the event monitor and a cardiac MRI - clonidine patch started yesterday for HTN- Losartan being held- last  BP 141/84  Cerebral artery aneurysms - There is a 2 mm laterally directed outpouching of the cavernous right ICA. Suspected 1-2 mm inferiorly directed outpouching from the distal supraclinoid left ICA.  - outpt f/u  Headaches - patient feels there are related to Losartan which has been stopped - Improved after Fioricet given early this AM- will give her a prescription for 20 tabs - if headaches do not resolve, she will need to discuss with neuro at outpt visit  LVH, Grade 1 diastolic CHF - as noted above,  [facility] for 30 day event monitor and cardiac MRI  Mild dilatation of ascending aorta ECHO > mild dilatation of the ascending aorta measuring 38 mm. - outpt f/u   Tobacco abuse - advised to quit   Discharge Exam: Vitals:   11/24/19 0425 11/24/19 0809  BP:  (!) 141/84  Pulse:  60  Resp:  18  Temp: 97.8 F (36.6 C) 98.3 F (36.8 C)  SpO2:  100%   Vitals:   11/24/19 0030 11/24/19  0415 11/24/19 0425 11/24/19 0809  BP: (!) 147/95 (!) 161/88  (!) 141/84  Pulse: 75 68  60  Resp: 19 18  18   Temp: 97.9 F (36.6 C) (!) 97.1 F (36.2 C) 97.8 F (36.6 C) 98.3 F (36.8 C)  TempSrc: Oral Oral Oral Oral  SpO2: 99% 97%  100%  Weight:      Height:        General: Pt is alert, awake, not in acute distress Cardiovascular: RRR, S1/S2 +, no rubs, no gallops Respiratory: CTA bilaterally, no wheezing, no rhonchi Abdominal: Soft, NT, ND, bowel sounds + Extremities: no edema, no cyanosis   Discharge Instructions  Discharge Instructions    Diet - low sodium heart healthy   Complete by: As directed    Increase activity slowly   Complete by: As directed      Allergies as of 11/24/2019      Reactions   Penicillins Hives      Medication List    STOP taking these medications   Carbinoxamine Maleate 4 MG Tabs   FLAX SEED OIL PO   ipratropium 0.06 % nasal spray Commonly known as: ATROVENT   losartan 50 MG tablet Commonly known as: COZAAR     TAKE these medications   acetaminophen 325 MG tablet Commonly known as: TYLENOL Take 2 tablets (650 mg total) by mouth every 4 (four) hours as needed for mild pain (or temp > 37.5 C (99.5 F)).   aspirin 81 MG EC tablet Take 1 tablet (81 mg total) by mouth daily.   atorvastatin 40 MG tablet Commonly known as: LIPITOR Take 1 tablet (40 mg total) by mouth daily at 6 PM.   butalbital-acetaminophen-caffeine 50-325-40 MG tablet Commonly known as: FIORICET Take 1-2 tablets by mouth every 6 (six) hours as needed for headache.   cloNIDine 0.1 mg/24hr patch Commonly known as: CATAPRES - Dosed in mg/24 hr Place 1 patch (0.1 mg total) onto the skin once a week. Start taking on: November 30, 2019   clopidogrel 75 MG tablet Commonly known as: Plavix Take 1 tablet (75 mg total) by mouth daily.   diazepam 5 MG tablet Commonly known as: VALIUM Take 5 mg by mouth every 12 (twelve) hours as needed for muscle spasms (sleep).    latanoprost 0.005 % ophthalmic solution Commonly known as: XALATAN Place 1 drop into both eyes at bedtime.   Magnesium 400 MG Caps Take 400 mg by mouth daily.   VITAMIN B-12 PO Take 1 tablet by mouth 3 (three) times a week.   vitamin C 1000 MG tablet Take 1,000 mg by mouth daily.   VITAMIN D3 PO Take 5,000 mcg by mouth daily.   Zinc-220 220 (50 Zn) MG capsule Generic drug: zinc sulfate Take 220 mg by mouth daily.      Follow-up Information    Geriatric PCP Specialist Follow up.   Why: Please call your insurance for participating providers in Geriatrics. Until then, continue to follow up with your current  PCP.  Contact information: [employer] Number on back of insurance card         Allergies  Allergen Reactions  . Penicillins Hives     Procedures/Studies:  1. Left ventricular ejection fraction, by visual estimation, is 60 to 65%. The left ventricle has normal function. There is severely increased left ventricular hypertrophy.  2. Elevated left atrial pressure.  3. Left ventricular diastolic parameters are consistent with Grade I diastolic dysfunction (impaired relaxation).  4. Global right ventricle has normal systolic function.The right ventricular size is normal.  5. Left atrial size was moderately dilated.  6. Right atrial size was normal.  7. Trivial pericardial effusion is present.  8. The mitral valve is normal in structure. Mild mitral valve regurgitation. No evidence of mitral stenosis.  9. The tricuspid valve is normal in structure. 10. The aortic valve is tricuspid. Aortic valve regurgitation is not visualized. No evidence of aortic valve sclerosis or stenosis. 11. The pulmonic valve was normal in structure. Pulmonic valve regurgitation is not visualized. 12. Aortic dilatation noted. 13. There is mild dilatation of the ascending aorta measuring 38 mm. 14. The inferior vena cava is normal in size with greater than 50% respiratory variability,  suggesting right atrial pressure of 3 mmHg. 15. Normal LV systolic function; grade 1 diastolic dysfunction; severe LVH (mild speckled appearance; suggest cardiac MRI to R/O HCM or amyloid); mildly dilated ascending aorta; moderate LAE. 16. The left ventricle has no regional wall motion abnormalities.   CT Angio Head W or Wo Contrast  Result Date: 11/22/2019 CLINICAL DATA:  Headache, episode of confusion and memory lapse EXAM: CT ANGIOGRAPHY HEAD AND NECK TECHNIQUE: Multidetector CT imaging of the head and neck was performed using the standard protocol during bolus administration of intravenous contrast. Multiplanar CT image reconstructions and MIPs were obtained to evaluate the vascular anatomy. Carotid stenosis measurements (when applicable) are obtained utilizing NASCET criteria, using the distal internal carotid diameter as the denominator. CONTRAST:  OMNIPAQUE IOHEXOL 350 MG/ML SOLN COMPARISON:  None. FINDINGS: CTA NECK FINDINGS Aortic arch: Great vessel origins are patent. Measures up to 3.5 cm. Right carotid system: Patent. There is mild calcified and noncalcified plaque at the bifurcation without measurable stenosis. Left carotid system: Patent. There is mild calcified and noncalcified plaque at the bifurcation without measurable stenosis. Vertebral arteries: Patent. Skeleton: Multilevel facet hypertrophy. Other neck: No neck mass or adenopathy. Upper chest: Emphysema. Review of the MIP images confirms the above findings CTA HEAD FINDINGS Anterior circulation: Intracranial internal carotid arteries are patent with calcified plaque causing mild stenosis. There is a 2 mm laterally directed outpouching of the cavernous right ICA. Suspected 1-2 mm inferiorly directed outpouching from the distal supraclinoid left ICA. Anterior and middle cerebral arteries are patent. Posterior circulation: Intracranial vertebral arteries are patent with minor plaque on the left. Basilar artery is patent. Posterior  cerebral arteries are patent. Venous sinuses: As permitted by contrast timing, patent. Review of the MIP images confirms the above findings IMPRESSION: No large vessel occlusion, hemodynamically significant stenosis, or evidence of dissection. 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 Suspected small aneurysm of the distal supraclinoid left ICA. Aortic arch measures up to 3.5 cm. Recommend annual imaging followup by CTA or MRA. This recommendation follows 2010 ACCF/AHA/AATS/ACR/ASA/SCA/SCAI/SIR/STS/SVM Guidelines for the Diagnosis and Management of Patients with Thoracic Aortic Disease. Circulation.2010; 121: F643-P295. Aortic aneurysm NOS (ICD10-I71.9) Electronically Signed   By: Guadlupe Spanish M.D.   On: 11/22/2019 17:51   DG Chest 2 View  Result Date: 11/23/2019 CLINICAL DATA:  TIA symptoms EXAM: CHEST - 2 VIEW COMPARISON:  None. FINDINGS: Cardiomegaly without CHF, edema, focal airspace process, collapse or consolidation. Negative for effusion or pneumothorax. Trachea midline. Aorta atherosclerotic. Degenerative changes of the spine. IMPRESSION: Cardiomegaly without acute process.  Thoracic atherosclerosis. Electronically Signed   By: Judie Petit.  Shick M.D.   On: 11/23/2019 11:25   CT Head Wo Contrast  Result Date: 11/22/2019 CLINICAL DATA:  Headache. Possible intracranial hemorrhage. Sudden onset with speech disturbance and memory loss today. EXAM: CT HEAD WITHOUT CONTRAST TECHNIQUE: Contiguous axial images were obtained from the base of the skull through the vertex without intravenous contrast. COMPARISON:  None. FINDINGS: Brain: Age related atrophy. No focal abnormality seen affecting the brainstem or cerebellum. Cerebral hemispheres show chronic small-vessel ischemic change of the white matter. Old basal ganglia/radiating white matter infarction on the right. No sign of acute or cortically based infarction. No mass lesion, hemorrhage, hydrocephalus or extra-axial collection. Vascular: There is atherosclerotic  calcification of the major vessels at the base of the brain. Skull: Negative Sinuses/Orbits: Clear/normal Other: None IMPRESSION: No acute finding by CT. Age related atrophy. Chronic small-vessel ischemic changes of the hemispheric white matter. 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 Electronically Signed   By: Paulina Fusi M.D.   On: 11/22/2019 15:14   CT Angio Neck W and/or Wo Contrast  Result Date: 11/22/2019 CLINICAL DATA:  Headache, episode of confusion and memory lapse EXAM: CT ANGIOGRAPHY HEAD AND NECK TECHNIQUE: Multidetector CT imaging of the head and neck was performed using the standard protocol during bolus administration of intravenous contrast. Multiplanar CT image reconstructions and MIPs were obtained to evaluate the vascular anatomy. Carotid stenosis measurements (when applicable) are obtained utilizing NASCET criteria, using the distal internal carotid diameter as the denominator. CONTRAST:  OMNIPAQUE IOHEXOL 350 MG/ML SOLN COMPARISON:  None. FINDINGS: CTA NECK FINDINGS Aortic arch: Great vessel origins are patent. Measures up to 3.5 cm. Right carotid system: Patent. There is mild calcified and noncalcified plaque at the bifurcation without measurable stenosis. Left carotid system: Patent. There is mild calcified and noncalcified plaque at the bifurcation without measurable stenosis. Vertebral arteries: Patent. Skeleton: Multilevel facet hypertrophy. Other neck: No neck mass or adenopathy. Upper chest: Emphysema. Review of the MIP images confirms the above findings CTA HEAD FINDINGS Anterior circulation: Intracranial internal carotid arteries are patent with calcified plaque causing mild stenosis. There is a 2 mm laterally directed outpouching of the cavernous right ICA. Suspected 1-2 mm inferiorly directed outpouching from the distal supraclinoid left ICA. Anterior and middle cerebral arteries are patent. Posterior circulation: Intracranial vertebral  arteries are patent with minor plaque on the left. Basilar artery is patent. Posterior cerebral arteries are patent. Venous sinuses: As permitted by contrast timing, patent. Review of the MIP images confirms the above findings IMPRESSION: No large vessel occlusion, hemodynamically significant stenosis, or evidence of dissection. 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 Suspected small aneurysm of the distal supraclinoid left ICA. Aortic arch measures up to 3.5 cm. Recommend annual imaging followup by CTA or MRA. This recommendation follows 2010 ACCF/AHA/AATS/ACR/ASA/SCA/SCAI/SIR/STS/SVM Guidelines for the Diagnosis and Management of Patients with Thoracic Aortic Disease. Circulation.2010; 121: Z610-R604. Aortic aneurysm NOS (ICD10-I71.9) Electronically Signed   By: Guadlupe Spanish M.D.   On: 11/22/2019 17:51   MR BRAIN WO CONTRAST  Result Date: 11/23/2019 CLINICAL DATA:  TIA. Transient speech/language disturbance. Headache. Hypertension. EXAM: MRI HEAD WITHOUT CONTRAST TECHNIQUE: Multiplanar, multiecho pulse sequences of  the brain and surrounding structures were obtained without intravenous contrast. COMPARISON:  Head CT 11/22/2019 FINDINGS: Brain: There is no evidence of acute infarct, mass, midline shift, or extra-axial fluid collection. Chronic microhemorrhages are noted in the left basal ganglia and pons, likely related to chronic hypertension. Patchy to confluent T2 hyperintensities in the cerebral white matter bilaterally are nonspecific but compatible with moderately advanced chronic small vessel ischemic disease. There are small chronic infarcts in the pons. T2 hyperintensities throughout the basal ganglia and thalami likely reflect a combination of dilated perivascular spaces and chronic small vessel ischemia. Mild cerebral atrophy is within normal limits for age. Vascular: Major intracranial vascular flow voids are preserved. Skull and upper cervical spine: Unremarkable bone marrow signal.  Sinuses/Orbits: Unremarkable orbits. Paranasal sinuses and mastoid air cells are clear. Other: None. IMPRESSION: 1. No acute intracranial abnormality. 2. Moderately advanced chronic small vessel ischemic disease. Electronically Signed   By: Sebastian Ache M.D.   On: 11/23/2019 13:19   ECHOCARDIOGRAM COMPLETE  Result Date: 11/23/2019   ECHOCARDIOGRAM REPORT   Patient Name:   [facility] Date of Exam: 11/23/2019 Medical Rec #:  [id number]       Height:       63.0 in Accession #:    [id number]      Weight:       131.0 lb Date of Birth:  [birth year]       BSA:          1.62 m Patient Age:    73 years        BP:           163/100 mmHg Patient Gender: F               HR:           62 bpm. Exam Location:  Inpatient Procedure: 2D Echo, Cardiac Doppler and Color Doppler Indications:    TIA 435.9 / G45.9  History:        Patient has no prior history of Echocardiogram examinations.                 Cardiomegaly; Risk Factors:Hypertension.  Sonographer:    Tiffany Dance Referring Phys: [id number] RONDELL A SMITH IMPRESSIONS  1. Left ventricular ejection fraction, by visual estimation, is 60 to 65%. The left ventricle has normal function. There is severely increased left ventricular hypertrophy.  2. Elevated left atrial pressure.  3. Left ventricular diastolic parameters are consistent with Grade I diastolic dysfunction (impaired relaxation).  4. Global right ventricle has normal systolic function.The right ventricular size is normal.  5. Left atrial size was moderately dilated.  6. Right atrial size was normal.  7. Trivial pericardial effusion is present.  8. The mitral valve is normal in structure. Mild mitral valve regurgitation. No evidence of mitral stenosis.  9. The tricuspid valve is normal in structure. 10. The aortic valve is tricuspid. Aortic valve regurgitation is not visualized. No evidence of aortic valve sclerosis or stenosis. 11. The pulmonic valve was normal in structure. Pulmonic valve regurgitation is not  visualized. 12. Aortic dilatation noted. 13. There is mild dilatation of the ascending aorta measuring 38 mm. 14. The inferior vena cava is normal in size with greater than 50% respiratory variability, suggesting right atrial pressure of 3 mmHg. 15. Normal LV systolic function; grade 1 diastolic dysfunction; severe LVH (mild speckled appearance; suggest cardiac MRI to R/O HCM or amyloid); mildly dilated ascending aorta; moderate LAE. 16. The left ventricle has no regional wall  motion abnormalities. FINDINGS  Left Ventricle: Left ventricular ejection fraction, by visual estimation, is 60 to 65%. The left ventricle has normal function. The left ventricle has no regional wall motion abnormalities. There is severely increased left ventricular hypertrophy. Left ventricular diastolic parameters are consistent with Grade I diastolic dysfunction (impaired relaxation). Elevated left atrial pressure. Right Ventricle: The right ventricular size is normal.Global RV systolic function is has normal systolic function. Left Atrium: Left atrial size was moderately dilated. Right Atrium: Right atrial size was normal in size Pericardium: Trivial pericardial effusion is present. Mitral Valve: The mitral valve is normal in structure. Mild mitral valve regurgitation. No evidence of mitral valve stenosis by observation. Tricuspid Valve: The tricuspid valve is normal in structure. Tricuspid valve regurgitation is trivial. Aortic Valve: The aortic valve is tricuspid. Aortic valve regurgitation is not visualized. The aortic valve is structurally normal, with no evidence of sclerosis or stenosis. Pulmonic Valve: The pulmonic valve was normal in structure. Pulmonic valve regurgitation is not visualized. Pulmonic regurgitation is not visualized. Aorta: Aortic dilatation noted. There is mild dilatation of the ascending aorta measuring 38 mm. Venous: The inferior vena cava is normal in size with greater than 50% respiratory variability,  suggesting right atrial pressure of 3 mmHg. IAS/Shunts: No atrial level shunt detected by color flow Doppler. Additional Comments: Normal LV systolic function; grade 1 diastolic dysfunction; severe LVH (mild speckled appearance; suggest cardiac MRI to R/O HCM or amyloid); mildly dilated ascending aorta; moderate LAE.  LEFT VENTRICLE PLAX 2D LVIDd:         3.94 cm  Diastology LVIDs:         2.35 cm  LV e' lateral:   6.20 cm/s LV PW:         1.12 cm  LV E/e' lateral: 10.7 LV IVS:        1.46 cm  LV e' medial:    3.59 cm/s LVOT diam:     1.80 cm  LV E/e' medial:  18.5 LV SV:         48 ml LV SV Index:   29.66 LVOT Area:     2.54 cm  RIGHT VENTRICLE             IVC RV Basal diam:  2.92 cm     IVC diam: 1.72 cm RV S prime:     15.40 cm/s TAPSE (M-mode): 2.4 cm LEFT ATRIUM             Index       RIGHT ATRIUM           Index LA diam:        3.80 cm 2.35 cm/m  RA Area:     16.90 cm LA Vol (A2C):   81.3 ml 50.33 ml/m RA Volume:   43.90 ml  27.18 ml/m LA Vol (A4C):   57.9 ml 35.84 ml/m LA Biplane Vol: 70.4 ml 43.58 ml/m  AORTIC VALVE LVOT Vmax:   96.70 cm/s LVOT Vmean:  62.000 cm/s LVOT VTI:    0.182 m  AORTA Ao Root diam: 3.60 cm Ao Asc diam:  3.80 cm MITRAL VALVE MV Area (PHT): 3.21 cm             SHUNTS MV PHT:        68.44 msec           Systemic VTI:  0.18 m MV Decel Time: 236 msec             Systemic Diam: 1.80  cm MV E velocity: 66.50 cm/s 103 cm/s MV A velocity: 66.90 cm/s 70.3 cm/s MV E/A ratio:  0.99       1.5  Olga Millers MD Electronically signed by Olga Millers MD Signature Date/Time: 11/23/2019/2:21:43 PM    Final      The results of significant diagnostics from this hospitalization (including imaging, microbiology, ancillary and laboratory) are listed below for reference.     Microbiology: Recent Results (from the past 240 hour(s))  SARS CORONAVIRUS 2 (TAT 6-24 HRS) Nasopharyngeal Nasopharyngeal Swab     Status: None   Collection Time: 11/22/19 10:58 PM   Specimen: Nasopharyngeal Swab   Result Value Ref Range Status   SARS Coronavirus 2 NEGATIVE NEGATIVE Final    Comment: (NOTE) SARS-CoV-2 target nucleic acids are NOT DETECTED. The SARS-CoV-2 RNA is generally detectable in upper and lower respiratory specimens during the acute phase of infection. Negative results do not preclude SARS-CoV-2 infection, do not rule out co-infections with other pathogens, and should not be used as the sole basis for treatment or other patient management decisions. Negative results must be combined with clinical observations, patient history, and epidemiological information. The expected result is Negative. Fact Sheet for Patients: HairSlick.no Fact Sheet for Healthcare Providers: quierodirigir.com This test is not yet approved or cleared by the [place] FDA and  has been authorized for detection and/or diagnosis of SARS-CoV-2 by FDA under an Emergency Use Authorization (EUA). This EUA will remain  in effect (meaning this test can be used) for the duration of the COVID-19 declaration under Section 56 4(b)(1) of the Act, 21 U.S.C. section 360bbb-3(b)(1), unless the authorization is terminated or revoked sooner. Performed at [facility], [address] [place], [place] [address]      Labs: BNP (last 3 results) No results for input(s): BNP in the last 8760 hours. Basic Metabolic Panel: Recent Labs  Lab 11/22/19 1455  NA 140  K 3.9  CL 104  CO2 26  GLUCOSE 120*  BUN 17  CREATININE 0.74  CALCIUM 9.5   Liver Function Tests: Recent Labs  Lab 11/22/19 1455  AST 26  ALT 17  ALKPHOS 60  BILITOT 0.4  PROT 7.4  ALBUMIN 4.2   No results for input(s): LIPASE, AMYLASE in the last 168 hours. No results for input(s): AMMONIA in the last 168 hours. CBC: Recent Labs  Lab 11/22/19 1455  WBC 4.1  NEUTROABS 2.0  HGB 14.0  HCT 40.2  MCV 89.5  PLT 167   Cardiac Enzymes: No results for input(s): CKTOTAL, CKMB,  CKMBINDEX, TROPONINI in the last 168 hours. BNP: Invalid input(s): POCBNP CBG: Recent Labs  Lab 11/22/19 1429  GLUCAP 123*   D-Dimer No results for input(s): DDIMER in the last 72 hours. Hgb A1c Recent Labs    11/23/19 1252  HGBA1C 5.5   Lipid Profile Recent Labs    11/23/19 1252  CHOL 232*  HDL 77  LDLCALC 144*  TRIG 55  CHOLHDL 3.0   Thyroid function studies No results for input(s): TSH, T4TOTAL, T3FREE, THYROIDAB in the last 72 hours.  Invalid input(s): FREET3 Anemia work up No results for input(s): VITAMINB12, FOLATE, FERRITIN, TIBC, IRON, RETICCTPCT in the last 72 hours. Urinalysis    Component Value Date/Time   COLORURINE STRAW (A) 11/22/2019 2004   APPEARANCEUR CLEAR 11/22/2019 2004   LABSPEC 1.010 11/22/2019 2004   PHURINE 7.5 11/22/2019 2004   GLUCOSEU NEGATIVE 11/22/2019 2004   HGBUR NEGATIVE 11/22/2019 2004   BILIRUBINUR NEGATIVE 11/22/2019  2004   [place] NEGATIVE 11/22/2019 2004   PROTEINUR NEGATIVE 11/22/2019 2004   NITRITE NEGATIVE 11/22/2019 2004   LEUKOCYTESUR NEGATIVE 11/22/2019 2004   Sepsis Labs Invalid input(s): PROCALCITONIN,  WBC,  LACTICIDVEN Microbiology Recent Results (from the past 240 hour(s))  SARS CORONAVIRUS 2 (TAT 6-24 HRS) Nasopharyngeal Nasopharyngeal Swab     Status: None   Collection Time: 11/22/19 10:58 PM   Specimen: Nasopharyngeal Swab  Result Value Ref Range Status   SARS Coronavirus 2 NEGATIVE NEGATIVE Final    Comment: (NOTE) SARS-CoV-2 target nucleic acids are NOT DETECTED. The SARS-CoV-2 RNA is generally detectable in upper and lower respiratory specimens during the acute phase of infection. Negative results do not preclude SARS-CoV-2 infection, do not rule out co-infections with other pathogens, and should not be used as the sole basis for treatment or other patient management decisions. Negative results must be combined with clinical observations, patient history, and epidemiological information. The  expected result is Negative. Fact Sheet for Patients: SugarRoll.be Fact Sheet for Healthcare Providers: https://www.woods-mathews.com/ This test is not yet approved or cleared by the [place] FDA and  has been authorized for detection and/or diagnosis of SARS-CoV-2 by FDA under an Emergency Use Authorization (EUA). This EUA will remain  in effect (meaning this test can be used) for the duration of the COVID-19 declaration under Section 56 4(b)(1) of the Act, 21 U.S.C. section 360bbb-3(b)(1), unless the authorization is terminated or revoked sooner. Performed at [facility], [place] [address] [place], [place] [address]      Time coordinating discharge in minutes: 65  SIGNED:   Debbe Odea, MD  [facility] 11/24/2019, 11:04 AM Pager   If 7PM-7AM, please contact night-coverage [email or web address] Password [email or web address]

## 2019-11-24 NOTE — Plan of Care (Signed)
  Problem: Health Behavior/Discharge Planning: Goal: Ability to manage health-related needs will improve Outcome: Progressing   Problem: Clinical Measurements: Goal: Ability to maintain clinical measurements within normal limits will improve Outcome: Progressing Goal: Diagnostic test results will improve Outcome: Progressing   Problem: Activity: Goal: Risk for activity intolerance will decrease Outcome: Progressing   Problem: Pain Managment: Goal: General experience of comfort will improve Outcome: Progressing   Problem: Safety: Goal: Ability to remain free from injury will improve Outcome: Progressing   Problem: Skin Integrity: Goal: Risk for impaired skin integrity will decrease Outcome: Progressing   Problem: Education: Goal: Knowledge of disease or condition will improve Outcome: Progressing Goal: Knowledge of secondary prevention will improve Outcome: Progressing Goal: Knowledge of patient specific risk factors addressed and post discharge goals established will improve Outcome: Progressing   Problem: Coping: Goal: Will identify appropriate support needs Outcome: Progressing   Problem: Health Behavior/Discharge Planning: Goal: Ability to manage health-related needs will improve Outcome: Progressing   Problem: Self-Care: Goal: Ability to communicate needs accurately will improve Outcome: Progressing   Problem: Ischemic Stroke/TIA Tissue Perfusion: Goal: Complications of ischemic stroke/TIA will be minimized Outcome: Progressing   Lincoln Brigham, BSN, RN

## 2019-11-26 ENCOUNTER — Telehealth: Payer: Self-pay | Admitting: *Deleted

## 2019-11-26 NOTE — Telephone Encounter (Signed)
Patient calling to schedule cardiac event monitor.

## 2019-11-26 NOTE — Telephone Encounter (Signed)
Patient enrolled for [employer] to ship a 30 day cardiac event monitor directly to her home.  Instructions reviewed briefly as they are included in the monitor kit.

## 2019-11-27 DIAGNOSIS — R7303 Prediabetes: Secondary | ICD-10-CM | POA: Diagnosis not present

## 2019-11-27 DIAGNOSIS — J302 Other seasonal allergic rhinitis: Secondary | ICD-10-CM | POA: Diagnosis not present

## 2019-11-27 DIAGNOSIS — F5101 Primary insomnia: Secondary | ICD-10-CM | POA: Diagnosis not present

## 2019-11-27 DIAGNOSIS — Z87891 Personal history of nicotine dependence: Secondary | ICD-10-CM | POA: Diagnosis not present

## 2019-11-27 DIAGNOSIS — Z8673 Personal history of transient ischemic attack (TIA), and cerebral infarction without residual deficits: Secondary | ICD-10-CM | POA: Diagnosis not present

## 2019-11-27 DIAGNOSIS — R1314 Dysphagia, pharyngoesophageal phase: Secondary | ICD-10-CM | POA: Diagnosis not present

## 2019-11-27 DIAGNOSIS — I10 Essential (primary) hypertension: Secondary | ICD-10-CM | POA: Diagnosis not present

## 2019-11-27 DIAGNOSIS — E785 Hyperlipidemia, unspecified: Secondary | ICD-10-CM | POA: Diagnosis not present

## 2019-11-27 DIAGNOSIS — I119 Hypertensive heart disease without heart failure: Secondary | ICD-10-CM | POA: Diagnosis not present

## 2019-11-28 ENCOUNTER — Other Ambulatory Visit: Payer: Self-pay

## 2019-11-28 ENCOUNTER — Emergency Department (HOSPITAL_BASED_OUTPATIENT_CLINIC_OR_DEPARTMENT_OTHER)
Admission: EM | Admit: 2019-11-28 | Discharge: 2019-11-28 | Disposition: A | Discharge status: Home / Self Care | Payer: Medicare HMO | Attending: Emergency Medicine | Admitting: Emergency Medicine

## 2019-11-28 ENCOUNTER — Encounter (HOSPITAL_BASED_OUTPATIENT_CLINIC_OR_DEPARTMENT_OTHER): Payer: Self-pay | Admitting: *Deleted

## 2019-11-28 DIAGNOSIS — I110 Hypertensive heart disease with heart failure: Secondary | ICD-10-CM | POA: Insufficient documentation

## 2019-11-28 DIAGNOSIS — F17210 Nicotine dependence, cigarettes, uncomplicated: Secondary | ICD-10-CM | POA: Insufficient documentation

## 2019-11-28 DIAGNOSIS — Z8673 Personal history of transient ischemic attack (TIA), and cerebral infarction without residual deficits: Secondary | ICD-10-CM | POA: Diagnosis not present

## 2019-11-28 DIAGNOSIS — I5032 Chronic diastolic (congestive) heart failure: Secondary | ICD-10-CM | POA: Diagnosis not present

## 2019-11-28 DIAGNOSIS — Z880 Allergy status to penicillin: Secondary | ICD-10-CM | POA: Diagnosis not present

## 2019-11-28 DIAGNOSIS — I10 Essential (primary) hypertension: Secondary | ICD-10-CM

## 2019-11-28 DIAGNOSIS — R519 Headache, unspecified: Secondary | ICD-10-CM | POA: Diagnosis present

## 2019-11-28 DIAGNOSIS — E785 Hyperlipidemia, unspecified: Secondary | ICD-10-CM | POA: Diagnosis not present

## 2019-11-28 MED ORDER — CLONIDINE HCL 0.1 MG PO TABS
0.1000 mg | ORAL_TABLET | Freq: Once | ORAL | Status: AC
  Administered 2019-11-28: 0.1 mg via ORAL
  Filled 2019-11-28: qty 1

## 2019-11-28 MED ORDER — CLONIDINE HCL 0.2 MG PO TABS: 0.2000 mg | ORAL_TABLET | Freq: Two times a day (BID) | ORAL | 0 refills | Status: DC

## 2019-11-28 NOTE — ED Triage Notes (Signed)
Pt requesting clonidine pills instead of patch. BP at home 174/117

## 2019-11-28 NOTE — ED Provider Notes (Signed)
Emergency Department Provider Note   I have reviewed the triage vital signs and the nursing notes.   HISTORY  Chief Complaint Hypertension   HPI Brooke Gray is a 74 y.o. female with PMH HTN presents to the ED with asymptomatic HTN over the last week. She was seen last week for similar and admitted at that time with HA and HTN urgency. She was have symptoms then but not since admit. She is requesting a change from Clonidine path to pills citing the path causing her BP to fluctuate. Denies CP, SOB, weakness, numbness, HA. No radiation of symptoms or modifying factors.   Past Medical History:  Diagnosis Date  . Cardiomegaly   . Glaucoma   . Hypertension     Patient Active Problem List   Diagnosis Date Noted  . Hypertensive urgency 12/01/2019  . Elevated troponin 12/01/2019  . Hypertensive crisis 11/30/2019  . Ascending aortic aneurysm (HCC) 11/24/2019  . LVH (left ventricular hypertrophy) due to hypertensive disease, without heart failure 11/24/2019  . Chronic diastolic CHF (congestive heart failure) (HCC) 11/24/2019  . Hypertensive emergency 11/24/2019  . Hyperlipemia 11/23/2019  . TIA (transient ischemic attack) 11/22/2019  . Globus sensation 04/10/2019  . Nonallergic rhinitis 04/10/2019  . Tobacco use 04/10/2019    Past Surgical History:  Procedure Laterality Date  . no past surgery      Allergies Penicillins  Family History  Problem Relation Age of Onset  . Allergic rhinitis Son   . Angioedema Neg Hx   . Asthma Neg Hx   . Eczema Neg Hx   . Immunodeficiency Neg Hx   . Urticaria Neg Hx     Social History Social History   Tobacco Use  . Smoking status: Current Every Day Smoker    Packs/day: 0.25    Years: 30.00    Pack years: 7.50    Types: Cigarettes  . Smokeless tobacco: Never Used  Substance Use Topics  . Alcohol use: Yes  . Drug use: No    Review of Systems  Constitutional: No fever/chills Eyes: No visual changes. ENT: No sore  throat. Cardiovascular: Denies chest pain. Elevated BP.  Respiratory: Denies shortness of breath. Gastrointestinal: No abdominal pain.  No nausea, no vomiting.  No diarrhea.  No constipation. Genitourinary: Negative for dysuria. Musculoskeletal: Negative for back pain. Skin: Negative for rash. Neurological: Negative for headaches, focal weakness or numbness.  10-point ROS otherwise negative.  ____________________________________________   PHYSICAL EXAM:  VITAL SIGNS: ED Triage Vitals  Enc Vitals Group     BP 11/28/19 1758 (!) 216/99     Pulse Rate 11/28/19 1757 63     Resp 11/28/19 1757 18     Temp 11/28/19 1757 98.3 F (36.8 C)     Temp Source 11/28/19 1757 Oral     SpO2 11/28/19 1757 100 %     Weight 11/28/19 1756 130 lb 1.1 oz (59 kg)     Height 11/28/19 1756 5\' 3"  (1.6 m)   Constitutional: Alert and oriented. Well appearing and in no acute distress. Eyes: Conjunctivae are normal.  Head: Atraumatic. Nose: No congestion/rhinnorhea. Mouth/Throat: Mucous membranes are moist.  Neck: No stridor.  Cardiovascular: Normal rate, regular rhythm. Good peripheral circulation. Grossly normal heart sounds.   Respiratory: Normal respiratory effort.  No retractions. Lungs CTAB. Gastrointestinal: Soft and nontender. No distention.  Musculoskeletal: No lower extremity tenderness nor edema. No gross deformities of extremities. Neurologic:  Normal speech and language. No gross focal neurologic deficits are appreciated.  Skin:  Skin is warm, dry and intact. No rash noted.  ____________________________________________   PROCEDURES  Procedure(s) performed:   Procedures  None ____________________________________________   INITIAL IMPRESSION / ASSESSMENT AND PLAN / ED COURSE  Pertinent labs & imaging results that were available during my care of the patient were reviewed by me and considered in my medical decision making (see chart for details).   Patient requesting change to BP  meds from clonidine path to pills. No symptoms to suggest HTN emergency. BP elevated but decreased at bedside for me after Clonidine. Plan to switch meds and have close f/u with PCP in the coming week.   ____________________________________________  FINAL CLINICAL IMPRESSION(S) / ED DIAGNOSES  Final diagnoses:  Essential hypertension     MEDICATIONS GIVEN DURING THIS VISIT:  Medications  cloNIDine (CATAPRES) tablet 0.1 mg (0.1 mg Oral Given 11/28/19 1827)     NEW OUTPATIENT MEDICATIONS STARTED DURING THIS VISIT:  Discharge Medication List as of 11/28/2019  6:21 PM    START taking these medications   Details  cloNIDine (CATAPRES) 0.2 MG tablet Take 1 tablet (0.2 mg total) by mouth 2 (two) times daily for 14 days., Starting Thu 11/28/2019, Until Thu 12/12/2019, Normal        Note:  This document was prepared using Dragon voice recognition software and may include unintentional dictation errors.  Nanda Quinton, MD, [facility]    Lisia Westbay, Wonda Olds, MD 12/01/19 2014

## 2019-11-28 NOTE — Discharge Instructions (Signed)
You were seen in the emergency department today with elevated blood pressures.  I have changed your prescription to the pill form of clonidine.  You do not wear the patch any longer.  Please take as directed and call your primary doctor in the morning to review the medication adjustment.  Further adjustments may be needed.  If you develop any severe headache, weakness, numbness, change in vision, or other sudden symptoms you will need to return to the emergency department for immediate evaluation.

## 2019-11-30 ENCOUNTER — Other Ambulatory Visit: Payer: Self-pay

## 2019-11-30 ENCOUNTER — Encounter (HOSPITAL_BASED_OUTPATIENT_CLINIC_OR_DEPARTMENT_OTHER): Payer: Self-pay | Admitting: Emergency Medicine

## 2019-11-30 ENCOUNTER — Observation Stay (HOSPITAL_BASED_OUTPATIENT_CLINIC_OR_DEPARTMENT_OTHER)
Admission: EM | Admit: 2019-11-30 | Discharge: 2019-12-01 | Disposition: A | Discharge status: Home / Self Care | Payer: Medicare HMO | Attending: Internal Medicine | Admitting: Internal Medicine

## 2019-11-30 ENCOUNTER — Emergency Department (HOSPITAL_BASED_OUTPATIENT_CLINIC_OR_DEPARTMENT_OTHER): Payer: Medicare HMO

## 2019-11-30 DIAGNOSIS — Z7982 Long term (current) use of aspirin: Secondary | ICD-10-CM | POA: Diagnosis not present

## 2019-11-30 DIAGNOSIS — I10 Essential (primary) hypertension: Secondary | ICD-10-CM | POA: Diagnosis not present

## 2019-11-30 DIAGNOSIS — R778 Other specified abnormalities of plasma proteins: Secondary | ICD-10-CM

## 2019-11-30 DIAGNOSIS — E785 Hyperlipidemia, unspecified: Secondary | ICD-10-CM | POA: Diagnosis present

## 2019-11-30 DIAGNOSIS — I161 Hypertensive emergency: Principal | ICD-10-CM | POA: Insufficient documentation

## 2019-11-30 DIAGNOSIS — Z20822 Contact with and (suspected) exposure to covid-19: Secondary | ICD-10-CM | POA: Diagnosis not present

## 2019-11-30 DIAGNOSIS — I5032 Chronic diastolic (congestive) heart failure: Secondary | ICD-10-CM | POA: Diagnosis not present

## 2019-11-30 DIAGNOSIS — G459 Transient cerebral ischemic attack, unspecified: Secondary | ICD-10-CM | POA: Diagnosis present

## 2019-11-30 DIAGNOSIS — I712 Thoracic aortic aneurysm, without rupture: Secondary | ICD-10-CM | POA: Insufficient documentation

## 2019-11-30 DIAGNOSIS — I110 Hypertensive heart disease with heart failure: Secondary | ICD-10-CM | POA: Diagnosis not present

## 2019-11-30 DIAGNOSIS — Z8673 Personal history of transient ischemic attack (TIA), and cerebral infarction without residual deficits: Secondary | ICD-10-CM | POA: Diagnosis not present

## 2019-11-30 DIAGNOSIS — I5033 Acute on chronic diastolic (congestive) heart failure: Secondary | ICD-10-CM | POA: Diagnosis present

## 2019-11-30 DIAGNOSIS — I160 Hypertensive urgency: Secondary | ICD-10-CM | POA: Diagnosis present

## 2019-11-30 DIAGNOSIS — Z7902 Long term (current) use of antithrombotics/antiplatelets: Secondary | ICD-10-CM | POA: Insufficient documentation

## 2019-11-30 DIAGNOSIS — I169 Hypertensive crisis, unspecified: Secondary | ICD-10-CM | POA: Diagnosis present

## 2019-11-30 DIAGNOSIS — H409 Unspecified glaucoma: Secondary | ICD-10-CM | POA: Diagnosis not present

## 2019-11-30 DIAGNOSIS — R7989 Other specified abnormal findings of blood chemistry: Secondary | ICD-10-CM

## 2019-11-30 DIAGNOSIS — R000 Tachycardia, unspecified: Secondary | ICD-10-CM | POA: Diagnosis not present

## 2019-11-30 DIAGNOSIS — F17210 Nicotine dependence, cigarettes, uncomplicated: Secondary | ICD-10-CM | POA: Diagnosis not present

## 2019-11-30 DIAGNOSIS — Z79899 Other long term (current) drug therapy: Secondary | ICD-10-CM | POA: Insufficient documentation

## 2019-11-30 DIAGNOSIS — R0602 Shortness of breath: Secondary | ICD-10-CM | POA: Diagnosis not present

## 2019-11-30 LAB — BASIC METABOLIC PANEL
Anion gap: 10 (ref 5–15)
BUN: 14 mg/dL (ref 8–23)
CO2: 26 mmol/L (ref 22–32)
Calcium: 9.4 mg/dL (ref 8.9–10.3)
Chloride: 104 mmol/L (ref 98–111)
Creatinine, Ser: 0.69 mg/dL (ref 0.44–1.00)
GFR calc Af Amer: >60 mL/min (ref >60)
GFR calc non Af Amer: >60 mL/min (ref >60)
Glucose, Bld: 114 mg/dL — ABNORMAL HIGH (ref 70–99)
Potassium: 3.9 mmol/L (ref 3.5–5.1)
Sodium: 140 mmol/L (ref 135–145)

## 2019-11-30 LAB — CBC WITH DIFFERENTIAL/PLATELET
Abs Immature Granulocytes: 0 10*3/uL (ref 0.00–0.07)
Basophils Absolute: 0 10*3/uL (ref 0.0–0.1)
Basophils Relative: 1 %
Eosinophils Absolute: 0.1 10*3/uL (ref 0.0–0.5)
Eosinophils Relative: 2 %
HCT: 38 % (ref 36.0–46.0)
Hemoglobin: 13.1 g/dL (ref 12.0–15.0)
Immature Granulocytes: 0 %
Lymphocytes Relative: 46 %
Lymphs Abs: 1.7 10*3/uL (ref 0.7–4.0)
MCH: 30.9 pg (ref 26.0–34.0)
MCHC: 34.5 g/dL (ref 30.0–36.0)
MCV: 89.6 fL (ref 80.0–100.0)
Monocytes Absolute: 0.3 10*3/uL (ref 0.1–1.0)
Monocytes Relative: 8 %
Neutro Abs: 1.6 10*3/uL — ABNORMAL LOW (ref 1.7–7.7)
Neutrophils Relative %: 43 %
Platelets: 139 10*3/uL — ABNORMAL LOW (ref 150–400)
RBC: 4.24 MIL/uL (ref 3.87–5.11)
RDW: 12.2 % (ref 11.5–15.5)
WBC: 3.6 10*3/uL — ABNORMAL LOW (ref 4.0–10.5)
nRBC: 0 % (ref 0.0–0.2)

## 2019-11-30 LAB — BRAIN NATRIURETIC PEPTIDE: B Natriuretic Peptide: 406.5 pg/mL — ABNORMAL HIGH (ref 0.0–100.0)

## 2019-11-30 LAB — HEPATIC FUNCTION PANEL
ALT: 26 U/L (ref 0–44)
AST: 28 U/L (ref 15–41)
Albumin: 3.9 g/dL (ref 3.5–5.0)
Alkaline Phosphatase: 47 U/L (ref 38–126)
Bilirubin, Direct: 0.1 mg/dL (ref 0.0–0.2)
Indirect Bilirubin: 0.4 mg/dL (ref 0.3–0.9)
Total Bilirubin: 0.5 mg/dL (ref 0.3–1.2)
Total Protein: 6.6 g/dL (ref 6.5–8.1)

## 2019-11-30 LAB — TROPONIN I (HIGH SENSITIVITY)
Troponin I (High Sensitivity): 255 ng/L (ref <18)
Troponin I (High Sensitivity): 229 ng/L (ref <18)

## 2019-11-30 LAB — D-DIMER, QUANTITATIVE: D-Dimer, Quant: 0.36 ug/mL-FEU (ref 0.00–0.50)

## 2019-11-30 MED ORDER — CLONIDINE HCL 0.1 MG PO TABS
0.2000 mg | ORAL_TABLET | Freq: Once | ORAL | Status: AC
  Administered 2019-11-30: 0.2 mg via ORAL
  Filled 2019-11-30: qty 2

## 2019-11-30 MED ORDER — HYDRALAZINE HCL 20 MG/ML IJ SOLN
10.0000 mg | Freq: Once | INTRAMUSCULAR | Status: DC
  Filled 2019-11-30: qty 1

## 2019-11-30 MED ORDER — SODIUM CHLORIDE 0.9% FLUSH
3.0000 mL | Freq: Once | INTRAVENOUS | Status: DC
  Filled 2019-11-30: qty 3

## 2019-11-30 MED ORDER — AMLODIPINE BESYLATE 5 MG PO TABS
5.0000 mg | ORAL_TABLET | Freq: Once | ORAL | Status: AC
  Administered 2019-11-30: 5 mg via ORAL
  Filled 2019-11-30: qty 1

## 2019-11-30 NOTE — ED Notes (Addendum)
Called [facility] @ [phone] spoke to [facility] about pt bed ready

## 2019-11-30 NOTE — ED Notes (Signed)
Lying in bed watching tv at this time.  Ambulating to bathroom as needed.  No complaints voiced at this time.  Encouraged to call for assistance as needed.

## 2019-11-30 NOTE — ED Notes (Signed)
Leaving with [facility] at this time.

## 2019-11-30 NOTE — Progress Notes (Signed)
[facility] to [facility] transfer:  Patient with h/o HTN, recent TIA (1/8-1/10 hospitalization), and grade 1 diastolic CHF with concern for cardiac aneurysms presenting with CP/SOB.  Patient with apparent hypertensive crisis.  Patient is on Clonidine monotherapy.  Also in the ER on 1/14 for similar presentation.  BPs at home 180-200s.  In ER, 181/100 - given Clonidine and Amlodipine.  EKG ok, troponin and BNP elevated - no significant chest pain, likely demand rather than NSTEMI.  D-dimer normal.  BP improved to 160/90, given hydralazine.  Needs a better PO regimen.  Given troponin leak, likely needs further observation.  COVID test [facility]) is pending.  Will place in telemetry Obs.     Georgana Curio, M.D.

## 2019-11-30 NOTE — ED Triage Notes (Signed)
Pt states that she has had 3 other visits for the " same" - The patient has had elevated Bp and now the patient has a SOB  - the patient states that she feels a "gurgle" in her throat. The patient is talking in complete sentances but had noted dyspnea after activity

## 2019-11-30 NOTE — ED Provider Notes (Signed)
[name] EMERGENCY DEPARTMENT Provider Note   CSN: [id number] Arrival date & time: 11/30/19  3244     History Chief Complaint  Patient presents with  . Shortness of Breath    Brooke Gray is a 74 y.o. female.  The history is provided by the patient.  Hypertension This is a chronic problem. The current episode started more than 1 week ago. The problem occurs daily. The problem has not changed since onset.Associated symptoms include shortness of breath (mild, passed). Pertinent negatives include no chest pain, no abdominal pain and no headaches. Nothing aggravates the symptoms. Nothing relieves the symptoms. She has tried nothing for the symptoms. The treatment provided no relief.       Past Medical History:  Diagnosis Date  . Cardiomegaly   . Glaucoma   . Hypertension     Patient Active Problem List   Diagnosis Date Noted  . Hypertensive crisis 11/30/2019  . Ascending aortic aneurysm ([place]) 11/24/2019  . LVH (left ventricular hypertrophy) due to hypertensive disease, without heart failure 11/24/2019  . Chronic diastolic CHF (congestive heart failure) ([place]) 11/24/2019  . Hypertensive emergency 11/24/2019  . Hyperlipemia 11/23/2019  . TIA (transient ischemic attack) 11/22/2019  . Globus sensation 04/10/2019  . Nonallergic rhinitis 04/10/2019  . Tobacco use 04/10/2019    Past Surgical History:  Procedure Laterality Date  . no past surgery       OB History   No obstetric history on file.     Family History  Problem Relation Age of Onset  . Allergic rhinitis Son   . Angioedema Neg Hx   . Asthma Neg Hx   . Eczema Neg Hx   . Immunodeficiency Neg Hx   . Urticaria Neg Hx     Social History   Tobacco Use  . Smoking status: Current Every Day Smoker    Packs/day: 0.25    Years: 30.00    Pack years: 7.50    Types: Cigarettes  . Smokeless tobacco: Never Used  Substance Use Topics  . Alcohol use: Yes  . Drug use: No    Home  Medications Prior to Admission medications   Medication Sig Start Date End Date Taking? Authorizing Provider  acetaminophen (TYLENOL) 325 MG tablet Take 2 tablets (650 mg total) by mouth every 4 (four) hours as needed for mild pain (or temp > 37.5 C (99.5 F)). 11/24/19   Debbe Odea, MD  Ascorbic Acid (VITAMIN C) 1000 MG tablet Take 1,000 mg by mouth daily.    [provider]  aspirin EC 81 MG EC tablet Take 1 tablet (81 mg total) by mouth daily. 11/24/19   Debbe Odea, MD  atorvastatin (LIPITOR) 40 MG tablet Take 1 tablet (40 mg total) by mouth daily at 6 PM. 11/24/19   Debbe Odea, MD  butalbital-acetaminophen-caffeine (FIORICET) 50-325-40 MG tablet Take 1-2 tablets by mouth every 6 (six) hours as needed for headache. 11/24/19 11/23/20  Debbe Odea, MD  Cholecalciferol (VITAMIN D3 PO) Take 5,000 mcg by mouth daily.     [provider]  cloNIDine (CATAPRES) 0.2 MG tablet Take 1 tablet (0.2 mg total) by mouth 2 (two) times daily for 14 days. 11/28/19 12/12/19  Long, Wonda Olds, MD  clopidogrel (PLAVIX) 75 MG tablet Take 1 tablet (75 mg total) by mouth daily. 11/24/19   Debbe Odea, MD  Cyanocobalamin (VITAMIN B-12 PO) Take 1 tablet by mouth 3 (three) times a week.     [provider]  diazepam (VALIUM) 5 MG tablet  Take 5 mg by mouth every 12 (twelve) hours as needed for muscle spasms (sleep).  07/04/17   [provider]  latanoprost (XALATAN) 0.005 % ophthalmic solution Place 1 drop into both eyes at bedtime.  03/30/19   [provider]  Magnesium 400 MG CAPS Take 400 mg by mouth daily.     [provider]  zinc sulfate (ZINC-220) 220 (50 Zn) MG capsule Take 220 mg by mouth daily.    [provider]    Allergies    Penicillins  Review of Systems   Review of Systems  Constitutional: Negative for chills and fever.  HENT: Negative for ear pain and sore throat.   Eyes: Negative for pain and visual disturbance.  Respiratory: Positive  for shortness of breath (mild, passed). Negative for cough.   Cardiovascular: Negative for chest pain and palpitations.  Gastrointestinal: Negative for abdominal pain and vomiting.  Genitourinary: Negative for dysuria and hematuria.  Musculoskeletal: Negative for arthralgias and back pain.  Skin: Negative for color change and rash.  Neurological: Negative for seizures, syncope and headaches.  All other systems reviewed and are negative.   Physical Exam Updated Vital Signs BP (!) 141/79   Pulse (!) 44   Temp 98 F (36.7 C) (Oral)   Resp 18   Ht 5\' 3"  (1.6 m)   Wt 59 kg   SpO2 98%   BMI 23.04 kg/m   Physical Exam Vitals and nursing note reviewed.  Constitutional:      General: She is not in acute distress.    Appearance: She is well-developed.  HENT:     Head: Normocephalic and atraumatic.  Eyes:     Conjunctiva/sclera: Conjunctivae normal.     Pupils: Pupils are equal, round, and reactive to light.  Cardiovascular:     Rate and Rhythm: Normal rate and regular rhythm.     Pulses: Normal pulses.     Heart sounds: Normal heart sounds. No murmur.  Pulmonary:     Effort: Pulmonary effort is normal. No respiratory distress.     Breath sounds: Normal breath sounds. No decreased breath sounds, wheezing or rales.  Abdominal:     Palpations: Abdomen is soft.     Tenderness: There is no abdominal tenderness.  Musculoskeletal:     Cervical back: Normal range of motion and neck supple.     Right lower leg: No edema.     Left lower leg: No edema.  Skin:    General: Skin is warm and dry.     Capillary Refill: Capillary refill takes less than 2 seconds.  Neurological:     General: No focal deficit present.     Mental Status: She is alert.  Psychiatric:        Mood and Affect: Mood normal.     ED Results / Procedures / Treatments   Labs (all labs ordered are listed, but only abnormal results are displayed) Labs Reviewed  CBC WITH DIFFERENTIAL/PLATELET - Abnormal; Notable  for the following components:      Result Value   WBC 3.6 (*)    Platelets 139 (*)    Neutro Abs 1.6 (*)    All other components within normal limits  BASIC METABOLIC PANEL - Abnormal; Notable for the following components:   Glucose, Bld 114 (*)    All other components within normal limits  BRAIN NATRIURETIC PEPTIDE - Abnormal; Notable for the following components:   B Natriuretic Peptide 406.5 (*)    All other components within  normal limits  TROPONIN I (HIGH SENSITIVITY) - Abnormal; Notable for the following components:   Troponin I (High Sensitivity) 255 (*)    All other components within normal limits  TROPONIN I (HIGH SENSITIVITY) - Abnormal; Notable for the following components:   Troponin I (High Sensitivity) 229 (*)    All other components within normal limits  RESPIRATORY PANEL BY RT PCR (FLU A&B, COVID)  HEPATIC FUNCTION PANEL  D-DIMER, QUANTITATIVE (NOT AT [facility])    EKG EKG Interpretation  Date/Time:  Saturday November 30 2019 07:31:18 EST Ventricular Rate:  57 PR Interval:    QRS Duration: 81 QT Interval:  437 QTC Calculation: 426 R Axis:   -8 Text Interpretation: Sinus rhythm Left atrial enlargement Nonspecific T abnormalities, lateral leads Confirmed by Virgina Norfolk [phone]) on 11/30/2019 7:40:00 AM   Radiology DG Chest Portable 1 View  Result Date: 11/30/2019 CLINICAL DATA:  Shortness of breath. EXAM: PORTABLE CHEST 1 VIEW COMPARISON:  November 23, 2019. FINDINGS: Stable cardiomegaly. No pneumothorax or pleural effusion is noted. Both lungs are clear. The visualized skeletal structures are unremarkable. IMPRESSION: No active disease. Electronically Signed   By: Lupita Raider M.D.   On: 11/30/2019 08:36    Procedures Procedures (including critical care time)  Medications Ordered in ED Medications  sodium chloride flush (NS) 0.9 % injection 3 mL (has no administration in time range)  hydrALAZINE (APRESOLINE) injection 10 mg (has no administration in time  range)  cloNIDine (CATAPRES) tablet 0.2 mg (0.2 mg Oral Given 11/30/19 0822)  amLODipine (NORVASC) tablet 5 mg (5 mg Oral Given 11/30/19 7425)    ED Course  I have reviewed the triage vital signs and the nursing notes.  Pertinent labs & imaging results that were available during my care of the patient were reviewed by me and considered in my medical decision making (see chart for details).    MDM Rules/Calculators/A&P  Lameshia Hypolite is a 74 year old female with history of hypertension, recent TIA with incidental small aneurysms who presents to the ED with concern for high blood pressure.  Patient overall is asymptomatic.  She is on clonidine.  Has had issues which blood pressure control in the past as multiple medications have not helped.  Her blood pressure is 181/100 upon arrival.  Otherwise normal vitals.  No signs of respiratory distress.  No chest pain, no stroke symptoms.  Neurologically she is intact.  She did not take her morning clonidine.  Therefore we will give her morning blood pressure medication as well as add amlodipine.  Will check for any signs of endorgan damage with blood work.  EKG shows sinus bradycardia with no ischemic changes.  Patient does not have any chest pain. Has some SOB. No hypoxia. However will evaluate for any signs of volume overload with a troponin, BNP, chest x-ray.  Troponin is elevated to 250.  BNP is elevated to 400.  D-dimer is negative however.  Patient otherwise with unremarkable lab work.  No significant anemia or electrolyte abnormality or kidney injury.  Blood pressure has improved with clonidine and amlodipine.  Repeat troponin is stable at 229.  Patient to be admitted for hypertensive emergency.  Blood pressure now in the 140s over 70.  Will hold on any further blood pressure medications.  This chart was dictated using voice recognition software.  Despite best efforts to proofread,  errors can occur which can change the documentation meaning.     Final Clinical Impression(s) / ED Diagnoses Final diagnoses:  Hypertensive emergency  Elevated troponin    Rx / DC Orders ED Discharge Orders    None       Virgina Norfolk, DO 11/30/19 1057

## 2019-11-30 NOTE — ED Notes (Signed)
Troponin 229, ED MD and RN informed

## 2019-11-30 NOTE — ED Notes (Signed)
Troponin 255, results given to ED MD

## 2019-11-30 NOTE — ED Notes (Signed)
Sitting up in bed eating at this time.  Encouraged to call for assistance as needed.

## 2019-12-01 ENCOUNTER — Encounter (HOSPITAL_COMMUNITY): Payer: Self-pay | Admitting: Cardiology

## 2019-12-01 DIAGNOSIS — R7989 Other specified abnormal findings of blood chemistry: Secondary | ICD-10-CM

## 2019-12-01 DIAGNOSIS — I5032 Chronic diastolic (congestive) heart failure: Secondary | ICD-10-CM | POA: Diagnosis not present

## 2019-12-01 DIAGNOSIS — G459 Transient cerebral ischemic attack, unspecified: Secondary | ICD-10-CM

## 2019-12-01 DIAGNOSIS — R778 Other specified abnormalities of plasma proteins: Secondary | ICD-10-CM

## 2019-12-01 DIAGNOSIS — I161 Hypertensive emergency: Secondary | ICD-10-CM | POA: Diagnosis not present

## 2019-12-01 DIAGNOSIS — I160 Hypertensive urgency: Secondary | ICD-10-CM | POA: Diagnosis not present

## 2019-12-01 DIAGNOSIS — Z8673 Personal history of transient ischemic attack (TIA), and cerebral infarction without residual deficits: Secondary | ICD-10-CM | POA: Diagnosis not present

## 2019-12-01 DIAGNOSIS — I169 Hypertensive crisis, unspecified: Secondary | ICD-10-CM

## 2019-12-01 DIAGNOSIS — H409 Unspecified glaucoma: Secondary | ICD-10-CM | POA: Diagnosis not present

## 2019-12-01 DIAGNOSIS — Z20822 Contact with and (suspected) exposure to covid-19: Secondary | ICD-10-CM | POA: Diagnosis not present

## 2019-12-01 DIAGNOSIS — F17210 Nicotine dependence, cigarettes, uncomplicated: Secondary | ICD-10-CM | POA: Diagnosis not present

## 2019-12-01 DIAGNOSIS — I712 Thoracic aortic aneurysm, without rupture: Secondary | ICD-10-CM | POA: Diagnosis not present

## 2019-12-01 DIAGNOSIS — E785 Hyperlipidemia, unspecified: Secondary | ICD-10-CM

## 2019-12-01 DIAGNOSIS — I110 Hypertensive heart disease with heart failure: Secondary | ICD-10-CM | POA: Diagnosis not present

## 2019-12-01 LAB — BASIC METABOLIC PANEL
Anion gap: 10 (ref 5–15)
BUN: 15 mg/dL (ref 8–23)
CO2: 24 mmol/L (ref 22–32)
Calcium: 9.8 mg/dL (ref 8.9–10.3)
Chloride: 107 mmol/L (ref 98–111)
Creatinine, Ser: 0.77 mg/dL (ref 0.44–1.00)
GFR calc Af Amer: >60 mL/min (ref >60)
GFR calc non Af Amer: >60 mL/min (ref >60)
Glucose, Bld: 94 mg/dL (ref 70–99)
Potassium: 4.1 mmol/L (ref 3.5–5.1)
Sodium: 141 mmol/L (ref 135–145)

## 2019-12-01 LAB — CBC
HCT: 39.3 % (ref 36.0–46.0)
Hemoglobin: 13.5 g/dL (ref 12.0–15.0)
MCH: 30.7 pg (ref 26.0–34.0)
MCHC: 34.4 g/dL (ref 30.0–36.0)
MCV: 89.3 fL (ref 80.0–100.0)
Platelets: 125 10*3/uL — ABNORMAL LOW (ref 150–400)
RBC: 4.4 MIL/uL (ref 3.87–5.11)
RDW: 12.2 % (ref 11.5–15.5)
WBC: 3.2 10*3/uL — ABNORMAL LOW (ref 4.0–10.5)
nRBC: 0 % (ref 0.0–0.2)

## 2019-12-01 LAB — MRSA PCR SCREENING: MRSA by PCR: NEGATIVE

## 2019-12-01 LAB — TROPONIN I (HIGH SENSITIVITY): Troponin I (High Sensitivity): 298 ng/L (ref <18)

## 2019-12-01 LAB — SARS CORONAVIRUS 2 (TAT 6-24 HRS): SARS Coronavirus 2: NEGATIVE

## 2019-12-01 MED ORDER — CLOPIDOGREL BISULFATE 75 MG PO TABS
75.0000 mg | ORAL_TABLET | Freq: Every day | ORAL | Status: DC
  Administered 2019-12-01: 75 mg via ORAL
  Filled 2019-12-01: qty 1

## 2019-12-01 MED ORDER — HYDROCHLOROTHIAZIDE 25 MG PO TABS: 25.0000 mg | ORAL_TABLET | Freq: Every day | ORAL | 1 refills | Status: DC

## 2019-12-01 MED ORDER — POLYETHYLENE GLYCOL 3350 17 G PO PACK
17.0000 g | PACK | Freq: Every day | ORAL | Status: DC
  Administered 2019-12-01: 17 g via ORAL
  Filled 2019-12-01: qty 1

## 2019-12-01 MED ORDER — CLONIDINE HCL 0.1 MG PO TABS: 0.1000 mg | ORAL_TABLET | Freq: Two times a day (BID) | ORAL | 1 refills | Status: DC

## 2019-12-01 MED ORDER — ATORVASTATIN CALCIUM 40 MG PO TABS: 40.0000 mg | ORAL_TABLET | Freq: Every day | ORAL | Status: DC

## 2019-12-01 MED ORDER — LATANOPROST 0.005 % OP SOLN
1.0000 [drp] | Freq: Every day | OPHTHALMIC | Status: DC
  Administered 2019-12-01: 01:00:00 1 [drp] via OPHTHALMIC
  Filled 2019-12-01: qty 2.5

## 2019-12-01 MED ORDER — AMLODIPINE BESYLATE 10 MG PO TABS
10.0000 mg | ORAL_TABLET | Freq: Every day | ORAL | Status: DC
  Administered 2019-12-01: 10 mg via ORAL
  Filled 2019-12-01: qty 1

## 2019-12-01 MED ORDER — ASPIRIN EC 81 MG PO TBEC
81.0000 mg | DELAYED_RELEASE_TABLET | Freq: Every day | ORAL | Status: DC
  Administered 2019-12-01: 81 mg via ORAL
  Filled 2019-12-01: qty 1

## 2019-12-01 MED ORDER — AMLODIPINE BESYLATE 10 MG PO TABS: 10.0000 mg | ORAL_TABLET | Freq: Every day | ORAL | 1 refills | Status: DC

## 2019-12-01 MED ORDER — HYDRALAZINE HCL 20 MG/ML IJ SOLN: 5.0000 mg | INTRAMUSCULAR | Status: DC | PRN

## 2019-12-01 MED ORDER — METOPROLOL TARTRATE 12.5 MG HALF TABLET
12.5000 mg | ORAL_TABLET | Freq: Two times a day (BID) | ORAL | Status: DC
  Filled 2019-12-01: qty 1

## 2019-12-01 MED ORDER — CLONIDINE HCL 0.1 MG PO TABS: 0.1000 mg | ORAL_TABLET | Freq: Two times a day (BID) | ORAL | Status: DC

## 2019-12-01 MED ORDER — HYDROCHLOROTHIAZIDE 25 MG PO TABS
25.0000 mg | ORAL_TABLET | Freq: Every day | ORAL | Status: DC
  Administered 2019-12-01: 25 mg via ORAL
  Filled 2019-12-01: qty 1

## 2019-12-01 MED ORDER — CLONIDINE HCL 0.2 MG PO TABS
0.2000 mg | ORAL_TABLET | Freq: Two times a day (BID) | ORAL | Status: DC
  Administered 2019-12-01 (×2): 0.2 mg via ORAL
  Filled 2019-12-01 (×2): qty 1

## 2019-12-01 MED ORDER — ENOXAPARIN SODIUM 40 MG/0.4ML ~~LOC~~ SOLN
40.0000 mg | Freq: Every day | SUBCUTANEOUS | Status: DC
  Filled 2019-12-01: qty 0.4

## 2019-12-01 NOTE — Progress Notes (Signed)
Patient refused Norvasc because she talked to cardiologist who told her that he will order one medication for HTN. Paging NP Annie Paras regarding this matter and She is talking with pt by phone now. HS [employer]

## 2019-12-01 NOTE — Progress Notes (Signed)
Removed PIV access and pt received discharge instructions with observation status notification notes. Pt understood it well. She took her belongings. HS [employer]

## 2019-12-01 NOTE — Care Management Obs Status (Signed)
MEDICARE OBSERVATION STATUS NOTIFICATION   Patient Details  Name: Brooke Gray MRN: [id number] Date of Birth: [birth year]   Medicare Observation Status Notification Given:  Yes    Lawerance Sabal, RN 12/01/2019, 3:03 PM

## 2019-12-01 NOTE — Progress Notes (Signed)
MEDICATION RELATED CONSULT NOTE - INITIAL   I was asked my cardiology to review medications with patient.   I reviewed patients stroke medications (asa, clopidogrel, and atorvastatin). Including side effects and indication. I then reviewed HTN regimen including the new addition of amlodipine to clonidine. We discussed dosing and side effects and monitoring. We also discussed her vitamins which she sounded confident with.   I answered all of her questions regarding medications. She is excited to be discharged home this afternoon.   Sheppard Coil PharmD., BCPS Clinical Pharmacist 12/01/2019 2:39 PM

## 2019-12-01 NOTE — Consult Note (Addendum)
Cardiology Consultation:   Patient ID: Brooke Gray MRN: [id number]; DOB: 01/05/1946  Admit date: 11/30/2019 Date of Consult: 12/01/2019  Primary Care Provider: Jackie Plum, MD Primary Cardiologist: No primary care provider on file. new Primary Electrophysiologist:  None    Patient Profile:   Brooke Gray is a 74 y.o. female with a hx of recent TIA, LVH, HTN, tobacco use, HLD ascending aortic aneurysm and chronic CHF who is being seen today for the evaluation of CHF at the request of Dr. Jonathon Bellows.  History of Present Illness:   Brooke Gray with hx of HTN and recent TIA just discharged 11/24/19 and with that admit echo with G1DD, EF 60-65%, severely increased LVH.  RV is normal. LA moderately dilated,  Now admitted with SOB, BP 181/100 and had just been switched from clonidine patch to po clonidine. She had not taken her morning clonidine.  No chest pain.   EKG:  The EKG was personally reviewed and demonstrates:  SB at 57  LA enlargement and non specific T wave abnormalities. Follow up SB at 49 and no acute changes Telemetry:  Telemetry was personally reviewed and demonstrates:  SB and NSVT   Na 140 K+ 3.9 BUN 14 Cr 0.69 LFTs nromal BNP 406 Troponin hs 255 and 229 Hgb 13.5, plts 125  DDimer 0.36 neg COVID neg PCXR NAD  Currently resting well.  No SOB or chest pain. No FH of CAD.  She gets confused about her meds, thinking plavix and lipitor worked on cholesterol discussed with her.   Heart Pathway Score:     Past Medical History:  Diagnosis Date  . Cardiomegaly   . Glaucoma   . Hypertension     Past Surgical History:  Procedure Laterality Date  . no past surgery       Home Medications:  Prior to Admission medications   Medication Sig Start Date End Date Taking? Authorizing Provider  acetaminophen (TYLENOL) 325 MG tablet Take 2 tablets (650 mg total) by mouth every 4 (four) hours as needed for mild pain (or temp > 37.5 C (99.5 F)). 11/24/19   Calvert Cantor, MD    Ascorbic Acid (VITAMIN C) 1000 MG tablet Take 1,000 mg by mouth daily.    [provider]  aspirin EC 81 MG EC tablet Take 1 tablet (81 mg total) by mouth daily. 11/24/19   Calvert Cantor, MD  atorvastatin (LIPITOR) 40 MG tablet Take 1 tablet (40 mg total) by mouth daily at 6 PM. 11/24/19   Calvert Cantor, MD  butalbital-acetaminophen-caffeine (FIORICET) 50-325-40 MG tablet Take 1-2 tablets by mouth every 6 (six) hours as needed for headache. 11/24/19 11/23/20  Calvert Cantor, MD  Cholecalciferol (VITAMIN D3 PO) Take 5,000 mcg by mouth daily.     [provider]  cloNIDine (CATAPRES) 0.2 MG tablet Take 1 tablet (0.2 mg total) by mouth 2 (two) times daily for 14 days. 11/28/19 12/12/19  Long, Arlyss Repress, MD  clopidogrel (PLAVIX) 75 MG tablet Take 1 tablet (75 mg total) by mouth daily. 11/24/19   Calvert Cantor, MD  Cyanocobalamin (VITAMIN B-12 PO) Take 1 tablet by mouth 3 (three) times a week.     [provider]  diazepam (VALIUM) 5 MG tablet Take 5 mg by mouth every 12 (twelve) hours as needed for muscle spasms (sleep).  07/04/17   [provider]  latanoprost (XALATAN) 0.005 % ophthalmic solution Place 1 drop into both eyes at bedtime.  03/30/19   [provider]  Magnesium 400 MG CAPS  Take 400 mg by mouth daily.     [provider]  zinc sulfate (ZINC-220) 220 (50 Zn) MG capsule Take 220 mg by mouth daily.    [provider]    Inpatient Medications: Scheduled Meds: . aspirin EC  81 mg Oral Daily  . atorvastatin  40 mg Oral q1800  . cloNIDine  0.2 mg Oral BID  . clopidogrel  75 mg Oral Daily  . enoxaparin (LOVENOX) injection  40 mg Subcutaneous Daily  . hydrochlorothiazide  25 mg Oral Daily  . latanoprost  1 drop Both Eyes QHS  . polyethylene glycol  17 g Oral Daily   Continuous Infusions:  PRN Meds: hydrALAZINE  Allergies:    Allergies  Allergen Reactions  . Penicillins Hives    Social History:   Social History    Socioeconomic History  . Marital status: Married    Spouse name: Not on file  . Number of children: Not on file  . Years of education: Not on file  . Highest education level: Not on file  Occupational History  . Not on file  Tobacco Use  . Smoking status: Current Every Day Smoker    Packs/day: 0.25    Years: 30.00    Pack years: 7.50    Types: Cigarettes  . Smokeless tobacco: Never Used  Substance and Sexual Activity  . Alcohol use: Yes  . Drug use: No  . Sexual activity: Not on file  Other Topics Concern  . Not on file  Social History Narrative  . Not on file   Social Determinants of Health   Financial Resource Strain:   . Difficulty of Paying Living Expenses: Not on file  Food Insecurity:   . Worried About Programme researcher, broadcasting/film/video in the Last Year: Not on file  . Ran Out of Food in the Last Year: Not on file  Transportation Needs:   . Lack of Transportation (Medical): Not on file  . Lack of Transportation (Non-Medical): Not on file  Physical Activity:   . Days of Exercise per Week: Not on file  . Minutes of Exercise per Session: Not on file  Stress:   . Feeling of Stress : Not on file  Social Connections:   . Frequency of Communication with Friends and Family: Not on file  . Frequency of Social Gatherings with Friends and Family: Not on file  . Attends Religious Services: Not on file  . Active Member of Clubs or Organizations: Not on file  . Attends Banker Meetings: Not on file  . Marital Status: Not on file  Intimate Partner Violence:   . Fear of Current or Ex-Partner: Not on file  . Emotionally Abused: Not on file  . Physically Abused: Not on file  . Sexually Abused: Not on file    Family History:    Family History  Problem Relation Age of Onset  . Allergic rhinitis Son   . Angioedema Neg Hx   . Asthma Neg Hx   . Eczema Neg Hx   . Immunodeficiency Neg Hx   . Urticaria Neg Hx      ROS:  Please see the history of present illness.   General:no colds or fevers, no weight changes Skin:no rashes or ulcers HEENT:no blurred vision, no congestion CV:see HPI PUL:see HPI GI:no diarrhea constipation or melena, no indigestion GU:no hematuria, no dysuria MS:no joint pain, no claudication Neuro:no syncope, no lightheadedness Endo:no diabetes, no thyroid disease  All other ROS reviewed and negative.  Physical Exam/Data:   Vitals:   12/01/19 0000 12/01/19 0038 12/01/19 0412 12/01/19 0750  BP:  (!) 164/98 118/73 (!) 132/94  Pulse:  67 (!) 51 (!) 49  Resp:   17 13  Temp:  98.2 F (36.8 C) 98.1 F (36.7 C) 97.7 F (36.5 C)  TempSrc:  Oral Oral Oral  SpO2:  99% 98% 100%  Weight: 58.1 kg     Height: 5' 2.99" (1.6 m)       Intake/Output Summary (Last 24 hours) at 12/01/2019 1052 Last data filed at 12/01/2019 0800 Gross per 24 hour  Intake 240 ml  Output --  Net 240 ml   Last 3 Weights 12/01/2019 11/30/2019 11/28/2019  Weight (lbs) 128 lb 1.4 oz 130 lb 1.1 oz 130 lb 1.1 oz  Weight (kg) 58.1 kg 59 kg 59 kg     Body mass index is 22.7 kg/m.  General:  Well nourished, well developed, in no acute distress HEENT: normal Lymph: no adenopathy Neck: no JVD Endocrine:  No thryomegaly Vascular: No carotid bruits; FA pulses 2+ bilaterally without bruits  Cardiac:  normal S1, S2; RRR; no murmur gallup rub or click Lungs:  clear to auscultation bilaterally, no wheezing, rhonchi or rales  Abd: soft, nontender, no hepatomegaly  Ext: no edema Musculoskeletal:  No deformities, BUE and BLE strength normal and equal Skin: warm and dry  Neuro:  Alert and orineted X 3 MAE follows commands, no focal abnormalities noted Psych:  Normal affect    Relevant CV Studies: 11/23/19 TTE IMPRESSIONS    1. Left ventricular ejection fraction, by visual estimation, is 60 to 65%. The left ventricle has normal function. There is severely increased left ventricular hypertrophy.  2. Elevated left atrial pressure.  3. Left ventricular  diastolic parameters are consistent with Grade I diastolic dysfunction (impaired relaxation).  4. Global right ventricle has normal systolic function.The right ventricular size is normal.  5. Left atrial size was moderately dilated.  6. Right atrial size was normal.  7. Trivial pericardial effusion is present.  8. The mitral valve is normal in structure. Mild mitral valve regurgitation. No evidence of mitral stenosis.  9. The tricuspid valve is normal in structure. 10. The aortic valve is tricuspid. Aortic valve regurgitation is not visualized. No evidence of aortic valve sclerosis or stenosis. 11. The pulmonic valve was normal in structure. Pulmonic valve regurgitation is not visualized. 12. Aortic dilatation noted. 13. There is mild dilatation of the ascending aorta measuring 38 mm. 14. The inferior vena cava is normal in size with greater than 50% respiratory variability, suggesting right atrial pressure of 3 mmHg. 15. Normal LV systolic function; grade 1 diastolic dysfunction; severe LVH (mild speckled appearance; suggest cardiac MRI to R/O HCM or amyloid); mildly dilated ascending aorta; moderate LAE. 16. The left ventricle has no regional wall motion abnormalities.  FINDINGS  Left Ventricle: Left ventricular ejection fraction, by visual estimation, is 60 to 65%. The left ventricle has normal function. The left ventricle has no regional wall motion abnormalities. There is severely increased left ventricular hypertrophy. Left  ventricular diastolic parameters are consistent with Grade I diastolic dysfunction (impaired relaxation). Elevated left atrial pressure.  Right Ventricle: The right ventricular size is normal.Global RV systolic function is has normal systolic function.  Left Atrium: Left atrial size was moderately dilated.  Right Atrium: Right atrial size was normal in size  Pericardium: Trivial pericardial effusion is present.  Mitral Valve: The mitral valve is normal in  structure. Mild mitral valve  regurgitation. No evidence of mitral valve stenosis by observation.  Tricuspid Valve: The tricuspid valve is normal in structure. Tricuspid valve regurgitation is trivial.  Aortic Valve: The aortic valve is tricuspid. Aortic valve regurgitation is not visualized. The aortic valve is structurally normal, with no evidence of sclerosis or stenosis.  Pulmonic Valve: The pulmonic valve was normal in structure. Pulmonic valve regurgitation is not visualized. Pulmonic regurgitation is not visualized.  Aorta: Aortic dilatation noted. There is mild dilatation of the ascending aorta measuring 38 mm.  Venous: The inferior vena cava is normal in size with greater than 50% respiratory variability, suggesting right atrial pressure of 3 mmHg.  IAS/Shunts: No atrial level shunt detected by color flow Doppler.  Additional Comments: Normal LV systolic function; grade 1 diastolic dysfunction; severe LVH (mild speckled appearance; suggest cardiac MRI to R/O HCM or amyloid); mildly dilated ascending aorta; moderate LAE.     Laboratory Data:  High Sensitivity Troponin:   Recent Labs  Lab 11/30/19 0746 11/30/19 0947  TROPONINIHS 255* 229*     Chemistry Recent Labs  Lab 11/30/19 0746  NA 140  K 3.9  CL 104  CO2 26  GLUCOSE 114*  BUN 14  CREATININE 0.69  CALCIUM 9.4  GFRNONAA >60  GFRAA >60  ANIONGAP 10    Recent Labs  Lab 11/30/19 0746  PROT 6.6  ALBUMIN 3.9  AST 28  ALT 26  ALKPHOS 47  BILITOT 0.5   Hematology Recent Labs  Lab 11/30/19 0746 12/01/19 0958  WBC 3.6* 3.2*  RBC 4.24 4.40  HGB 13.1 13.5  HCT 38.0 39.3  MCV 89.6 89.3  MCH 30.9 30.7  MCHC 34.5 34.4  RDW 12.2 12.2  PLT 139* 125*   BNP Recent Labs  Lab 11/30/19 0746  BNP 406.5*    DDimer  Recent Labs  Lab 11/30/19 0852  DDIMER 0.36     Radiology/Studies:  DG Chest Portable 1 View  Result Date: 11/30/2019 CLINICAL DATA:  Shortness of breath. EXAM: PORTABLE CHEST  1 VIEW COMPARISON:  November 23, 2019. FINDINGS: Stable cardiomegaly. No pneumothorax or pleural effusion is noted. Both lungs are clear. The visualized skeletal structures are unremarkable. IMPRESSION: No active disease. Electronically Signed   By: Marijo Conception M.D.   On: 11/30/2019 08:36        No chest pain   Assessment and Plan:   1. Elevated troponin with HTN episode most likely cause and severe LVH - needs good control of BP - no BB or dilt due to slow HR.  Dr. Curt Bears to see.  2. HTN pt not always compliant- discussed importance of not missing clonidine.  On hydrodiuril as well.  Amlodipine may have caused head ache vs the losartan she had been on.  3. Severe LVH, BP control is a must. 4. NSVT - 7-8 beats  She has 30 day event monitor ordered already for TIA eval   5. 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 of the distal supraclinoid left ICA. 7. Aortic arch at 3.5 cm - follow up yearly.  8. HLD on statin for CVA     Would recommend pharmacy review her herbal meds with her and her prescribed meds.   For questions or updates, please contact [place] Please consult [email or web address] for contact info under     Signed, Cecilie Kicks, NP  12/01/2019 10:52 AM  I have seen and examined this patient with Cecilie Kicks.  Agree with above, note added  to reflect my findings.  On exam, RRR, no murmurs, lungs clear.  Patient presented to the hospital initially with hypertension and some shortness of breath.  Labs were checked and she had an elevated troponin in the low 200s.  She has not had any chest pain with this.  It is likely that her shortness of breath and elevated troponin are due to demand ischemia from her elevated blood pressures.  Her blood pressures are well controlled now and she is breathing normally.  She was on 0.2 mg of clonidine, but missed doses and thus this is likely the cause of her blood pressure being elevated.  We Katheline Brendlinger decrease her  clonidine to 0.1 mg and start her on Norvasc today.  This can be titrated by her primary physician as an outpatient.  She has plans for a 30-day event monitor.  As her blood pressure is well controlled, Esme Durkin sign off today.  We Griff Badley arrange for follow-up in cardiology clinic.  The patient does wish to go home today as she is feeling well.  Andri Prestia M. Lilyonna Steidle MD 12/01/2019 11:48 AM

## 2019-12-01 NOTE — H&P (Signed)
History and Physical        Hospital Admission Note Date: 12/01/2019  Patient name: Brooke Gray Medical record number: [id number] Date of birth: 06-04-1946 Age: 73 y.o. Gender: female  PCP: Jackie Plum, MD    Patient coming from: [facility]   I have reviewed all records in the [facility].    Chief Complaint:  SOB   HPI: Brooke Gray is 74 y.o. female with PMH of HTN, recent TIA (hospitalized 1/8-1/10) and Grade I Diastolic CHF who presented to [facility] for mild SOB, resolved while in ED. Found to have elevated BPs with BP of 181/100 upon arrival. Takes Clonodine 0.2 mg BID, only BP medication. Has been on other BP medications in the past, including Amlodipine. Is unsure of other medications tried. Reports Amlodipine gave her bad headaches. She wants to try to avoid as many medications as possible. Was started on Clonidine patch about a week ago, switched to PO clonidine on Monday of this week when BPs remained high.    ED work-up/course:  Brooke Gray is a 74 year old female with history of hypertension, recent TIA with incidental small aneurysms who presents to the ED with concern for high blood pressure.  Patient overall is asymptomatic.  She is on clonidine.  Has had issues which blood pressure control in the past as multiple medications have not helped.  Her blood pressure is 181/100 upon arrival.  Otherwise normal vitals.  No signs of respiratory distress.  No chest pain, no stroke symptoms.  Neurologically she is intact.  She did not take her morning clonidine.  Therefore we will give her morning blood pressure medication as well as add amlodipine.  Will check for any signs of endorgan damage with blood work.  EKG shows sinus bradycardia with no ischemic changes.  Patient does not have any chest pain. Has some SOB. No hypoxia. However will evaluate for any signs of volume overload with a troponin, BNP, chest  x-ray.  Troponin is elevated to 250.  BNP is elevated to 400.  D-dimer is negative however.  Patient otherwise with unremarkable lab work.  No significant anemia or electrolyte abnormality or kidney injury.  Blood pressure has improved with clonidine and amlodipine.  Repeat troponin is stable at 229.  Patient to be admitted for hypertensive emergency.  Blood pressure now in the 140s over 70.  Will hold on any further blood pressure medications.  Review of Systems: Positives marked in 'bold' Constitutional: Denies fever, chills, diaphoresis, poor appetite and fatigue.  HEENT: Denies photophobia, eye pain, redness, hearing loss, ear pain, congestion, sore throat, rhinorrhea, sneezing, mouth sores, trouble swallowing, neck pain, neck stiffness and tinnitus.   Respiratory: Denies SOB, DOE, cough, chest tightness,  and wheezing.   Cardiovascular: Denies chest pain, palpitations and leg swelling.  Gastrointestinal: Denies nausea, vomiting, abdominal pain, diarrhea, constipation, blood in stool and abdominal distention.  Genitourinary: Denies dysuria, urgency, frequency, hematuria, flank pain and difficulty urinating.  Musculoskeletal: Denies myalgias, back pain, joint swelling, arthralgias and gait problem.  Skin: Denies pallor, rash and wound.  Neurological: Denies dizziness, seizures, syncope, weakness, light-headedness, numbness and headaches.  Hematological: Denies adenopathy. Easy bruising, personal or family bleeding history  Psychiatric/Behavioral: Denies suicidal ideation, mood changes, confusion, nervousness, sleep disturbance and agitation  Past Medical History: Past Medical History:  Diagnosis Date  . Cardiomegaly   . Glaucoma   . Hypertension     Past Surgical History:  Procedure Laterality Date  . no past surgery      Medications: Prior to Admission medications   Medication Sig Start Date End Date Taking? Authorizing Provider  acetaminophen (TYLENOL) 325 MG tablet Take 2  tablets (650 mg total) by mouth every 4 (four) hours as needed for mild pain (or temp > 37.5 C (99.5 F)). 11/24/19   Calvert Cantor, MD  Ascorbic Acid (VITAMIN C) 1000 MG tablet Take 1,000 mg by mouth daily.    [provider]  aspirin EC 81 MG EC tablet Take 1 tablet (81 mg total) by mouth daily. 11/24/19   Calvert Cantor, MD  atorvastatin (LIPITOR) 40 MG tablet Take 1 tablet (40 mg total) by mouth daily at 6 PM. 11/24/19   Calvert Cantor, MD  butalbital-acetaminophen-caffeine (FIORICET) 50-325-40 MG tablet Take 1-2 tablets by mouth every 6 (six) hours as needed for headache. 11/24/19 11/23/20  Calvert Cantor, MD  Cholecalciferol (VITAMIN D3 PO) Take 5,000 mcg by mouth daily.     [provider]  cloNIDine (CATAPRES) 0.2 MG tablet Take 1 tablet (0.2 mg total) by mouth 2 (two) times daily for 14 days. 11/28/19 12/12/19  Long, Arlyss Repress, MD  clopidogrel (PLAVIX) 75 MG tablet Take 1 tablet (75 mg total) by mouth daily. 11/24/19   Calvert Cantor, MD  Cyanocobalamin (VITAMIN B-12 PO) Take 1 tablet by mouth 3 (three) times a week.     [provider]  diazepam (VALIUM) 5 MG tablet Take 5 mg by mouth every 12 (twelve) hours as needed for muscle spasms (sleep).  07/04/17   [provider]  latanoprost (XALATAN) 0.005 % ophthalmic solution Place 1 drop into both eyes at bedtime.  03/30/19   [provider]  Magnesium 400 MG CAPS Take 400 mg by mouth daily.     [provider]  zinc sulfate (ZINC-220) 220 (50 Zn) MG capsule Take 220 mg by mouth daily.    [provider]    Allergies:   Allergies  Allergen Reactions  . Penicillins Hives    Social History:  reports that she has been smoking cigarettes. She has a 7.50 pack-year smoking history. She has never used smokeless tobacco. She reports current alcohol use. She reports that she does not use drugs.  Family History: Family History  Problem Relation Age of Onset  . Allergic rhinitis Son   .  Angioedema Neg Hx   . Asthma Neg Hx   . Eczema Neg Hx   . Immunodeficiency Neg Hx   . Urticaria Neg Hx     Physical Exam: Blood pressure (!) 155/91, pulse 60, temperature 98 F (36.7 C), temperature source Oral, resp. rate 20, height 5' 2.99" (1.6 m), weight 58.1 kg, SpO2 99 %. General: Alert, awake, oriented x3, in no acute distress. Eyes: pink conjunctiva,anicteric sclera, pupils equal and reactive to light and accomodation, HEENT: normocephalic, atraumatic, oropharynx clear Neck: supple, no masses or lymphadenopathy, no goiter, no bruits, no JVD CVS: Regular rate and rhythm, without murmurs, rubs or gallops. No lower extremity edema Resp : Clear to auscultation bilaterally, no wheezing, rales or rhonchi. GI : Soft, nontender, nondistended, positive bowel sounds, no masses. No hepatomegaly. No hernia.  Musculoskeletal: No clubbing or cyanosis, positive pedal pulses. No contracture. ROM intact  Neuro: Grossly intact, no focal neurological deficits, strength 5/5 upper and lower extremities bilaterally Psych: alert and oriented x 3, normal mood and affect Skin: no rashes or lesions, warm and dry   LABS on Admission: I have personally reviewed all the labs and imagings below    Basic Metabolic Panel: Recent Labs  Lab 11/30/19 0746  NA 140  K 3.9  CL 104  CO2 26  GLUCOSE 114*  BUN 14  CREATININE 0.69  CALCIUM 9.4   Liver Function Tests: Recent Labs  Lab 11/30/19 0746  AST 28  ALT 26  ALKPHOS 47  BILITOT 0.5  PROT 6.6  ALBUMIN 3.9   No results for input(s): LIPASE, AMYLASE in the last 168 hours. No results for input(s): AMMONIA in the last 168 hours. CBC: Recent Labs  Lab 11/30/19 0746  WBC 3.6*  NEUTROABS 1.6*  HGB 13.1  HCT 38.0  MCV 89.6  PLT 139*   Cardiac Enzymes: No results for input(s): CKTOTAL, CKMB, CKMBINDEX, TROPONINI in the last 168 hours. BNP: Invalid input(s): POCBNP CBG: No results for input(s): GLUCAP in the last 168  hours.  Radiological Exams on Admission:  DG Chest Portable 1 View  Result Date: 11/30/2019 CLINICAL DATA:  Shortness of breath. EXAM: PORTABLE CHEST 1 VIEW COMPARISON:  November 23, 2019. FINDINGS: Stable cardiomegaly. No pneumothorax or pleural effusion is noted. Both lungs are clear. The visualized skeletal structures are unremarkable. IMPRESSION: No active disease. Electronically Signed   By: Marijo Conception M.D.   On: 11/30/2019 08:36      EKG: Independently reviewed. NSR. No acute ST segment changes.    Assessment/Plan Active Problems:   TIA (transient ischemic attack)   Hyperlipemia   Chronic diastolic CHF (congestive heart failure) ([place])   Hypertensive crisis   Hypertensive urgency   Elevated troponin  Hypertensive Crisis  BP 181/100 at presentation. Take Clonidine 0.2 mg BID at home. Was given dose of Clonidine, Amlodipine 5 mg with improvement in BPs. At admission, BP 164/98. Troponin is elevated. LFTS and kidney function WNL.  -admit to observation, telemetry with pulse ox checks with vital signs -continue Clonidine  -start HCTZ 25 mg daily; considered Beta Blocker therapy especially given h/o CHF however patient with bradycardia  -Hydralazine 5 mg q4h prn for SBP >180 and DBP >110; patient no longer in window for permissive HTN from recent TIA  -will need further management of BPs outpatient to maximize therapy  -consider cardiology consult   Elevated Troponin  Troponin 255>229. Likely related to demand ischemia as patient denies chest pain, other signs of ACS. EKG without acute ST segment changes.  -repeat EKG in AM   Chronic Diastolic CHF  Last echo 03/22/51 with EF 60-65% and Grade I DD. BNP 406. CXR with no signs of active disease. No signs of volume overload on exam.  -monitor volume status   Recent TIA  -continue ASA, Plavix, Statin therapy    DVT prophylaxis: Lovenox   CODE STATUS: FULL   Consults called: None   Family Communication: Admission,  patients condition and plan of care including tests being ordered have been discussed with the patient and ...Marland KitchenMarland KitchenMarland Kitchen who indicates understanding and agree with the plan and Code Status  Admission status: Observation   The medical decision making on this patient was of high complexity and the patient is at high risk for clinical deterioration, therefore this is a level 3 admission.  Severity of Illness:     Moderate  The appropriate patient status for  this patient is OBSERVATION. Observation status is judged to be reasonable and necessary in order to provide the required intensity of service to ensure the patient's safety. The patient's presenting symptoms, physical exam findings, and initial radiographic and laboratory data in the context of their medical condition is felt to place them at decreased risk for further clinical deterioration. Furthermore, it is anticipated that the patient will be medically stable for discharge from the hospital within 2 midnights of admission. The following factors support the patient status of observation.   " The patient's presenting symptoms include SOB. " The physical exam findings include elevated BP. " The initial radiographic and laboratory data are troponin 255, BNP 400.     Time Spent on Admission: 39 minutes      De Hollingshead D.O.  [facility] 12/01/2019, 12:21 AM

## 2019-12-01 NOTE — Discharge Summary (Signed)
Physician Discharge Summary  Brooke Gray ZOX:[id number] DOB: 25-May-1946 DOA: 11/30/2019  PCP: Jackie Plum, MD  Admit date: 11/30/2019 Discharge date: 12/01/2019  Admitted From: home Disposition:  HOME  Recommendations for Outpatient Follow-up:  1. Follow up with PCP in 1-2 weeks 2. Please obtain BMP/CBC in one week 3. Please follow up on the following pending results:  Home Health: no  Equipment/Devices: none  Discharge Condition: Stable Code Status: full Diet recommendation: Heart Healthy  Brief/Interim Summary:  73yof  W/ hx of HTN, recent TIA (hospitalized 1/8-1/10), grade 1 diastolic CHF grade I Diastolic CHF admitted from [facility] with mild SOB -that resolved in ER and uncontrolled hypertension.  Up in the ED showed elevated troponin to 50 BNP 400 D-dimer negative.  Patient was admitted for further management.  On last admission she had TIA withdifficulty talking-completed stroke work-up discharged with DAPT x 3wk then asa 81 alone and lipitor/ plan for 30 day event monitor, also had  2 mm outpouching of the cavernous right ICA, suspected 1 to 2 mm inferiorly directed outpouching from the distal supraclinoid left ICA-outpatient follow-up was recommended, also had mild dilatation of ascending aorta 38 mm and echo with outpatient follow-up.  She was also discharged on clonidine 0.1 mg patch once a week, losartan was discontinued. This morning patient feels well no nausea vomiting chest pain shortness of breath. Denies any numbness tingling speech issues or weakness. Patient was admitted, meds adjusted, seen by cardiology. BP stable and will go home with pcpc/cardio follow up.  Issues being addressed/Discharge Diagnoses:  Hypertensive crisis, BP 180/100 on presentation-initially started on clonidine 0.2 mg twice daily and placed on HCTZ 25 mg daily.  Did get amlodipine in the ED, patient been on amlodipine in the past but had stopped but she denied having any  side effects.  Seen by cardiology.  Blood pressure uncontrolled likely from missing clonidine.  meds adjusted per cardio-  Clonidine 0.1mg - hctz and amlodipine and to be titrated by her primary care provider   Elevated troponin: trop 255->229, patient without chest pain EKG without ischemic changes, in the setting of uncontrolled hypertension suspecting demand mismatch but given recent stroke, her age, risk factors and no prior ischemic work-seen by cardiology-avised no further work-up and outpatient follow-up with PCP for blood pressure management  Chronic diastolic CHF last echo 1/19 EF 60 to 60% grade 1 DD, BNP is 406 chest x-ray no active disease.  Compensated.  Recent TIA patient is on DAPT x3 weeks aspirin alone.  Apparently she stopped her Lipitor.  I advised to continue. I did instruct her regarding neuro recs for DAPT X3 week on last discharge then stop plavix and cont asa after 3 weeks   COVID-19 neg  Consults:  cardio  Subjective: resting well on RA, NO CHEST PAIN, no headache, no focal weakness or tingling or numbness  Discharge Exam: Vitals:   12/01/19 0750 12/01/19 1224  BP: (!) 132/94 (!) 147/96  Pulse: (!) 49   Resp: 13 15  Temp: 97.7 F (36.5 C) 97.7 F (36.5 C)  SpO2: 100% 98%   General: Pt is alert, awake, not in acute distress Cardiovascular: RRR, S1/S2 +, no rubs, no gallops Respiratory: CTA bilaterally, no wheezing, no rhonchi Abdominal: Soft, NT, ND, bowel sounds + Extremities: no edema, no cyanosis  Discharge Instructions  Discharge Instructions    Diet - low sodium heart healthy   Complete by: As directed    Discharge instructions   Complete by: As directed  Please call call MD or return to ER for similar or worsening recurring problem that brought you to hospital or if any fever,nausea/vomiting,abdominal pain, uncontrolled pain, chest pain,  shortness of breath or any other alarming symptoms.  Please follow-up your doctor as instructed in a week  time and call the office for appointment.  Please avoid alcohol, smoking, or any other illicit substance and maintain healthy habits including taking your regular medications as prescribed.  You were cared for by a hospitalist during your hospital stay. If you have any questions about your discharge medications or the care you received while you were in the hospital after you are discharged, you can call the unit and ask to speak with the hospitalist on call if the hospitalist that took care of you is not available.  Once you are discharged, your primary care physician will handle any further medical issues. Please note that NO REFILLS for any discharge medications will be authorized once you are discharged, as it is imperative that you return to your primary care physician (or establish a relationship with a primary care physician if you do not have one) for your aftercare needs so that they can reassess your need for medications and monitor your lab values  Please follow up with cardiology as instructed. Monitor BP at home and f/u with PCP/Cardiology.   Increase activity slowly   Complete by: As directed      Allergies as of 12/01/2019      Reactions   Penicillins Hives      Medication List    TAKE these medications   acetaminophen 325 MG tablet Commonly known as: TYLENOL Take 2 tablets (650 mg total) by mouth every 4 (four) hours as needed for mild pain (or temp > 37.5 C (99.5 F)).   amLODipine 10 MG tablet Commonly known as: NORVASC Take 1 tablet (10 mg total) by mouth daily. Start taking on: December 02, 2019   aspirin 81 MG EC tablet Take 1 tablet (81 mg total) by mouth daily.   atorvastatin 40 MG tablet Commonly known as: LIPITOR Take 1 tablet (40 mg total) by mouth daily at 6 PM.   butalbital-acetaminophen-caffeine 50-325-40 MG tablet Commonly known as: FIORICET Take 1-2 tablets by mouth every 6 (six) hours as needed for headache.   cloNIDine 0.1 MG tablet Commonly  known as: CATAPRES Take 1 tablet (0.1 mg total) by mouth 2 (two) times daily. What changed:   medication strength  how much to take   clopidogrel 75 MG tablet Commonly known as: Plavix Take 1 tablet (75 mg total) by mouth daily.   diazepam 5 MG tablet Commonly known as: VALIUM Take 5 mg by mouth every 12 (twelve) hours as needed for muscle spasms (sleep).   hydrochlorothiazide 25 MG tablet Commonly known as: HYDRODIURIL Take 1 tablet (25 mg total) by mouth daily. Start taking on: December 02, 2019   latanoprost 0.005 % ophthalmic solution Commonly known as: XALATAN Place 1 drop into both eyes at bedtime.   Magnesium 400 MG Caps Take 400 mg by mouth daily.   VITAMIN B-12 PO Take 1 tablet by mouth 3 (three) times a week.   vitamin C 1000 MG tablet Take 1,000 mg by mouth daily.   VITAMIN D3 PO Take 5,000 mcg by mouth daily.   Zinc-220 220 (50 Zn) MG capsule Generic drug: zinc sulfate Take 220 mg by mouth daily.      Follow-up Information    [facility] Follow up.  Specialty: Cardiology Why: our office will call and shedule an appt to follow with your thick heart muscle and your High blood pressure.   Contact information: [address] [place] [phone]       Benito Mccreedy, MD Follow up in 1 week(s).   Specialty: Internal Medicine Contact information: [address] [place] [address] [phone]          Allergies  Allergen Reactions  . Penicillins Hives    The results of significant diagnostics from this hospitalization (including imaging, microbiology, ancillary and laboratory) are listed below for reference.    Microbiology: Recent Results (from the past 240 hour(s))  SARS CORONAVIRUS 2 (TAT 6-24 HRS) Nasopharyngeal Nasopharyngeal Swab     Status: None   Collection Time: 11/22/19 10:58 PM   Specimen: Nasopharyngeal Swab  Result Value Ref Range Status   SARS  Coronavirus 2 NEGATIVE NEGATIVE Final    Comment: (NOTE) SARS-CoV-2 target nucleic acids are NOT DETECTED. The SARS-CoV-2 RNA is generally detectable in upper and lower respiratory specimens during the acute phase of infection. Negative results do not preclude SARS-CoV-2 infection, do not rule out co-infections with other pathogens, and should not be used as the sole basis for treatment or other patient management decisions. Negative results must be combined with clinical observations, patient history, and epidemiological information. The expected result is Negative. Fact Sheet for Patients: SugarRoll.be Fact Sheet for Healthcare Providers: https://www.woods-mathews.com/ This test is not yet approved or cleared by the [place] FDA and  has been authorized for detection and/or diagnosis of SARS-CoV-2 by FDA under an Emergency Use Authorization (EUA). This EUA will remain  in effect (meaning this test can be used) for the duration of the COVID-19 declaration under Section 56 4(b)(1) of the Act, 21 U.S.C. section 360bbb-3(b)(1), unless the authorization is terminated or revoked sooner. Performed at [facility], [place] [address], [place], [place] [address]   MRSA PCR Screening     Status: None   Collection Time: 12/01/19 12:22 AM   Specimen: Nasal Mucosa; Nasopharyngeal  Result Value Ref Range Status   MRSA by PCR NEGATIVE NEGATIVE Final    Comment:        The GeneXpert MRSA Assay (FDA approved for NASAL specimens only), is one component of a comprehensive MRSA colonization surveillance program. It is not intended to diagnose MRSA infection nor to guide or monitor treatment for MRSA infections. Performed at [facility], [place] [address], [place], [place] [address]   SARS CORONAVIRUS 2 (TAT 6-24 HRS) Nasopharyngeal Nasopharyngeal Swab     Status: None   Collection Time: 12/01/19  4:33 AM   Specimen: Nasopharyngeal Swab   Result Value Ref Range Status   SARS Coronavirus 2 NEGATIVE NEGATIVE Final    Comment: (NOTE) SARS-CoV-2 target nucleic acids are NOT DETECTED. The SARS-CoV-2 RNA is generally detectable in upper and lower respiratory specimens during the acute phase of infection. Negative results do not preclude SARS-CoV-2 infection, do not rule out co-infections with other pathogens, and should not be used as the sole basis for treatment or other patient management decisions. Negative results must be combined with clinical observations, patient history, and epidemiological information. The expected result is Negative. Fact Sheet for Patients: SugarRoll.be Fact Sheet for Healthcare Providers: https://www.woods-mathews.com/ This test is not yet approved or cleared by the [place] FDA and  has been authorized for detection and/or diagnosis of SARS-CoV-2 by FDA under an Emergency Use Authorization (  EUA). This EUA will remain  in effect (meaning this test can be used) for the duration of the COVID-19 declaration under Section 56 4(b)(1) of the Act, 21 U.S.C. section 360bbb-3(b)(1), unless the authorization is terminated or revoked sooner. Performed at [facility], [address] [place], [place] [address]     Procedures/Studies: CT Angio Head W or Wo Contrast  Result Date: 11/22/2019 CLINICAL DATA:  Headache, episode of confusion and memory lapse EXAM: CT ANGIOGRAPHY HEAD AND NECK TECHNIQUE: Multidetector CT imaging of the head and neck was performed using the standard protocol during bolus administration of intravenous contrast. Multiplanar CT image reconstructions and MIPs were obtained to evaluate the vascular anatomy. Carotid stenosis measurements (when applicable) are obtained utilizing NASCET criteria, using the distal internal carotid diameter as the denominator. CONTRAST:  100mL OMNIPAQUE IOHEXOL 350 MG/ML SOLN COMPARISON:  None. FINDINGS: CTA  NECK FINDINGS Aortic arch: Great vessel origins are patent. Measures up to 3.5 cm. Right carotid system: Patent. There is mild calcified and noncalcified plaque at the bifurcation without measurable stenosis. Left carotid system: Patent. There is mild calcified and noncalcified plaque at the bifurcation without measurable stenosis. Vertebral arteries: Patent. Skeleton: Multilevel facet hypertrophy. Other neck: No neck mass or adenopathy. Upper chest: Emphysema. Review of the MIP images confirms the above findings CTA HEAD FINDINGS Anterior circulation: Intracranial internal carotid arteries are patent with calcified plaque causing mild stenosis. There is a 2 mm laterally directed outpouching of the cavernous right ICA. Suspected 1-2 mm inferiorly directed outpouching from the distal supraclinoid left ICA. Anterior and middle cerebral arteries are patent. Posterior circulation: Intracranial vertebral arteries are patent with minor plaque on the left. Basilar artery is patent. Posterior cerebral arteries are patent. Venous sinuses: As permitted by contrast timing, patent. Review of the MIP images confirms the above findings IMPRESSION: No large vessel occlusion, hemodynamically significant stenosis, or evidence of dissection. 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 Suspected small aneurysm of the distal supraclinoid left ICA. Aortic arch measures up to 3.5 cm. Recommend annual imaging followup by CTA or MRA. This recommendation follows 2010 ACCF/AHA/AATS/ACR/ASA/SCA/SCAI/SIR/STS/SVM Guidelines for the Diagnosis and Management of Patients with Thoracic Aortic Disease. Circulation.2010; 121: X914-N829: E266-e369. Aortic aneurysm NOS (ICD10-I71.9) Electronically Signed   By: Guadlupe SpanishPraneil  Patel M.D.   On: 11/22/2019 17:51   DG Chest 2 View  Result Date: 11/23/2019 CLINICAL DATA:  TIA symptoms EXAM: CHEST - 2 VIEW COMPARISON:  None. FINDINGS: Cardiomegaly without CHF, edema, focal airspace process, collapse or consolidation.  Negative for effusion or pneumothorax. Trachea midline. Aorta atherosclerotic. Degenerative changes of the spine. IMPRESSION: Cardiomegaly without acute process.  Thoracic atherosclerosis. Electronically Signed   By: Judie PetitM.  Shick M.D.   On: 11/23/2019 11:25   CT Head Wo Contrast  Result Date: 11/22/2019 CLINICAL DATA:  Headache. Possible intracranial hemorrhage. Sudden onset with speech disturbance and memory loss today. EXAM: CT HEAD WITHOUT CONTRAST TECHNIQUE: Contiguous axial images were obtained from the base of the skull through the vertex without intravenous contrast. COMPARISON:  None. FINDINGS: Brain: Age related atrophy. No focal abnormality seen affecting the brainstem or cerebellum. Cerebral hemispheres show chronic small-vessel ischemic change of the white matter. Old basal ganglia/radiating white matter infarction on the right. No sign of acute or cortically based infarction. No mass lesion, hemorrhage, hydrocephalus or extra-axial collection. Vascular: There is atherosclerotic calcification of the major vessels at the base of the brain. Skull: Negative Sinuses/Orbits: Clear/normal Other: None IMPRESSION: No acute finding by CT. Age related atrophy. Chronic small-vessel  ischemic changes of the hemispheric white matter. 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 Electronically Signed   By: Mark  Shogry M.D.   On: 11/22/2019 15:14 Paulina Fusi  CT Angio Neck W and/or Wo Contrast  Result Date: 11/22/2019 CLINICAL DATA:  Headache, episode of confusion and memory lapse EXAM: CT ANGIOGRAPHY HEAD AND NECK TECHNIQUE: Multidetector CT imaging of the head and neck was performed using the standard protocol during bolus administration of intravenous contrast. Multiplanar CT image reconstructions and MIPs were obtained to evaluate the vascular anatomy. Carotid stenosis measurements (when applicable) are obtained utilizing NASCET criteria, using the distal internal carotid diameter as the  denominator. CONTRAST:  100mL OMNIPAQUE IOHEXOL 350 MG/ML SOLN COMPARISON:  None. FINDINGS: CTA NECK FINDINGS Aortic arch: Great vessel origins are patent. Measures up to 3.5 cm. Right carotid system: Patent. There is mild calcified and noncalcified plaque at the bifurcation without measurable stenosis. Left carotid system: Patent. There is mild calcified and noncalcified plaque at the bifurcation without measurable stenosis. Vertebral arteries: Patent. Skeleton: Multilevel facet hypertrophy. Other neck: No neck mass or adenopathy. Upper chest: Emphysema. Review of the MIP images confirms the above findings CTA HEAD FINDINGS Anterior circulation: Intracranial internal carotid arteries are patent with calcified plaque causing mild stenosis. There is a 2 mm laterally directed outpouching of the cavernous right ICA. Suspected 1-2 mm inferiorly directed outpouching from the distal supraclinoid left ICA. Anterior and middle cerebral arteries are patent. Posterior circulation: Intracranial vertebral arteries are patent with minor plaque on the left. Basilar artery is patent. Posterior cerebral arteries are patent. Venous sinuses: As permitted by contrast timing, patent. Review of the MIP images confirms the above findings IMPRESSION: No large vessel occlusion, hemodynamically significant stenosis, or evidence of dissection. 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 Suspected small aneurysm of the distal supraclinoid left ICA. Aortic arch measures up to 3.5 cm. Recommend annual imaging followup by CTA or MRA. This recommendation follows 2010 ACCF/AHA/AATS/ACR/ASA/SCA/SCAI/SIR/STS/SVM Guidelines for the Diagnosis and Management of Patients with Thoracic Aortic Disease. Circulation.2010; 121: F621-H086: E266-e369. Aortic aneurysm NOS (ICD10-I71.9) Electronically Signed   By: Guadlupe SpanishPraneil  Patel M.D.   On: 11/22/2019 17:51   MR BRAIN WO CONTRAST  Result Date: 11/23/2019 CLINICAL DATA:  TIA. Transient speech/language disturbance.  Headache. Hypertension. EXAM: MRI HEAD WITHOUT CONTRAST TECHNIQUE: Multiplanar, multiecho pulse sequences of the brain and surrounding structures were obtained without intravenous contrast. COMPARISON:  Head CT 11/22/2019 FINDINGS: Brain: There is no evidence of acute infarct, mass, midline shift, or extra-axial fluid collection. Chronic microhemorrhages are noted in the left basal ganglia and pons, likely related to chronic hypertension. Patchy to confluent T2 hyperintensities in the cerebral white matter bilaterally are nonspecific but compatible with moderately advanced chronic small vessel ischemic disease. There are small chronic infarcts in the pons. T2 hyperintensities throughout the basal ganglia and thalami likely reflect a combination of dilated perivascular spaces and chronic small vessel ischemia. Mild cerebral atrophy is within normal limits for age. Vascular: Major intracranial vascular flow voids are preserved. Skull and upper cervical spine: Unremarkable bone marrow signal. Sinuses/Orbits: Unremarkable orbits. Paranasal sinuses and mastoid air cells are clear. Other: None. IMPRESSION: 1. No acute intracranial abnormality. 2. Moderately advanced chronic small vessel ischemic disease. Electronically Signed   By: Sebastian AcheAllen  Grady M.D.   On: 11/23/2019 13:19   DG Chest Portable 1 View  Result Date: 11/30/2019 CLINICAL DATA:  Shortness of breath. EXAM: PORTABLE CHEST 1 VIEW COMPARISON:  November 23, 2019. FINDINGS: Stable cardiomegaly. No  pneumothorax or pleural effusion is noted. Both lungs are clear. The visualized skeletal structures are unremarkable. IMPRESSION: No active disease. Electronically Signed   By: Lupita Raider M.D.   On: 11/30/2019 08:36   ECHOCARDIOGRAM COMPLETE  Result Date: 11/23/2019   ECHOCARDIOGRAM REPORT   Patient Name:   [facility] Date of Exam: 11/23/2019 Medical Rec #:  [id number]       Height:       63.0 in Accession #:    [id number]      Weight:       131.0 lb Date of  Birth:  [birth year]       BSA:          1.62 m Patient Age:    73 years        BP:           163/100 mmHg Patient Gender: F               HR:           62 bpm. Exam Location:  Inpatient Procedure: 2D Echo, Cardiac Doppler and Color Doppler Indications:    TIA 435.9 / G45.9  History:        Patient has no prior history of Echocardiogram examinations.                 Cardiomegaly; Risk Factors:Hypertension.  Sonographer:    Tiffany Dance Referring Phys: [id number] RONDELL A SMITH IMPRESSIONS  1. Left ventricular ejection fraction, by visual estimation, is 60 to 65%. The left ventricle has normal function. There is severely increased left ventricular hypertrophy.  2. Elevated left atrial pressure.  3. Left ventricular diastolic parameters are consistent with Grade I diastolic dysfunction (impaired relaxation).  4. Global right ventricle has normal systolic function.The right ventricular size is normal.  5. Left atrial size was moderately dilated.  6. Right atrial size was normal.  7. Trivial pericardial effusion is present.  8. The mitral valve is normal in structure. Mild mitral valve regurgitation. No evidence of mitral stenosis.  9. The tricuspid valve is normal in structure. 10. The aortic valve is tricuspid. Aortic valve regurgitation is not visualized. No evidence of aortic valve sclerosis or stenosis. 11. The pulmonic valve was normal in structure. Pulmonic valve regurgitation is not visualized. 12. Aortic dilatation noted. 13. There is mild dilatation of the ascending aorta measuring 38 mm. 14. The inferior vena cava is normal in size with greater than 50% respiratory variability, suggesting right atrial pressure of 3 mmHg. 15. Normal LV systolic function; grade 1 diastolic dysfunction; severe LVH (mild speckled appearance; suggest cardiac MRI to R/O HCM or amyloid); mildly dilated ascending aorta; moderate LAE. 16. The left ventricle has no regional wall motion abnormalities. FINDINGS  Left Ventricle: Left  ventricular ejection fraction, by visual estimation, is 60 to 65%. The left ventricle has normal function. The left ventricle has no regional wall motion abnormalities. There is severely increased left ventricular hypertrophy. Left ventricular diastolic parameters are consistent with Grade I diastolic dysfunction (impaired relaxation). Elevated left atrial pressure. Right Ventricle: The right ventricular size is normal.Global RV systolic function is has normal systolic function. Left Atrium: Left atrial size was moderately dilated. Right Atrium: Right atrial size was normal in size Pericardium: Trivial pericardial effusion is present. Mitral Valve: The mitral valve is normal in structure. Mild mitral valve regurgitation. No evidence of mitral valve stenosis by observation. Tricuspid Valve: The tricuspid valve is normal in structure. Tricuspid valve regurgitation is trivial. Aortic  Valve: The aortic valve is tricuspid. Aortic valve regurgitation is not visualized. The aortic valve is structurally normal, with no evidence of sclerosis or stenosis. Pulmonic Valve: The pulmonic valve was normal in structure. Pulmonic valve regurgitation is not visualized. Pulmonic regurgitation is not visualized. Aorta: Aortic dilatation noted. There is mild dilatation of the ascending aorta measuring 38 mm. Venous: The inferior vena cava is normal in size with greater than 50% respiratory variability, suggesting right atrial pressure of 3 mmHg. IAS/Shunts: No atrial level shunt detected by color flow Doppler. Additional Comments: Normal LV systolic function; grade 1 diastolic dysfunction; severe LVH (mild speckled appearance; suggest cardiac MRI to R/O HCM or amyloid); mildly dilated ascending aorta; moderate LAE.  LEFT VENTRICLE PLAX 2D LVIDd:         3.94 cm  Diastology LVIDs:         2.35 cm  LV e' lateral:   6.20 cm/s LV PW:         1.12 cm  LV E/e' lateral: 10.7 LV IVS:        1.46 cm  LV e' medial:    3.59 cm/s LVOT diam:      1.80 cm  LV E/e' medial:  18.5 LV SV:         48 ml LV SV Index:   29.66 LVOT Area:     2.54 cm  RIGHT VENTRICLE             IVC RV Basal diam:  2.92 cm     IVC diam: 1.72 cm RV S prime:     15.40 cm/s TAPSE (M-mode): 2.4 cm LEFT ATRIUM             Index       RIGHT ATRIUM           Index LA diam:        3.80 cm 2.35 cm/m  RA Area:     16.90 cm LA Vol (A2C):   81.3 ml 50.33 ml/m RA Volume:   43.90 ml  27.18 ml/m LA Vol (A4C):   57.9 ml 35.84 ml/m LA Biplane Vol: 70.4 ml 43.58 ml/m  AORTIC VALVE LVOT Vmax:   96.70 cm/s LVOT Vmean:  62.000 cm/s LVOT VTI:    0.182 m  AORTA Ao Root diam: 3.60 cm Ao Asc diam:  3.80 cm MITRAL VALVE MV Area (PHT): 3.21 cm             SHUNTS MV PHT:        68.44 msec           Systemic VTI:  0.18 m MV Decel Time: 236 msec             Systemic Diam: 1.80 cm MV E velocity: 66.50 cm/s 103 cm/s MV A velocity: 66.90 cm/s 70.3 cm/s MV E/A ratio:  0.99       1.5  Olga Millers MD Electronically signed by Olga Millers MD Signature Date/Time: 11/23/2019/2:21:43 PM    Final     Labs: BNP (last 3 results) Recent Labs    11/30/19 0746  BNP 406.5*   Basic Metabolic Panel: Recent Labs  Lab 11/30/19 0746 12/01/19 0958  NA 140 141  K 3.9 4.1  CL 104 107  CO2 26 24  GLUCOSE 114* 94  BUN 14 15  CREATININE 0.69 0.77  CALCIUM 9.4 9.8   Liver Function Tests: Recent Labs  Lab 11/30/19 0746  AST 28  ALT 26  ALKPHOS 47  BILITOT  0.5  PROT 6.6  ALBUMIN 3.9   No results for input(s): LIPASE, AMYLASE in the last 168 hours. No results for input(s): AMMONIA in the last 168 hours. CBC: Recent Labs  Lab 11/30/19 0746 12/01/19 0958  WBC 3.6* 3.2*  NEUTROABS 1.6*  --   HGB 13.1 13.5  HCT 38.0 39.3  MCV 89.6 89.3  PLT 139* 125*   Cardiac Enzymes: No results for input(s): CKTOTAL, CKMB, CKMBINDEX, TROPONINI in the last 168 hours. BNP: Invalid input(s): POCBNP CBG: No results for input(s): GLUCAP in the last 168 hours. D-Dimer Recent Labs    11/30/19 0852   DDIMER 0.36   Hgb A1c No results for input(s): HGBA1C in the last 72 hours. Lipid Profile No results for input(s): CHOL, HDL, LDLCALC, TRIG, CHOLHDL, LDLDIRECT in the last 72 hours. Thyroid function studies No results for input(s): TSH, T4TOTAL, T3FREE, THYROIDAB in the last 72 hours.  Invalid input(s): FREET3 Anemia work up No results for input(s): VITAMINB12, FOLATE, FERRITIN, TIBC, IRON, RETICCTPCT in the last 72 hours. Urinalysis    Component Value Date/Time   COLORURINE STRAW (A) 11/22/2019 2004   APPEARANCEUR CLEAR 11/22/2019 2004   LABSPEC 1.010 11/22/2019 2004   PHURINE 7.5 11/22/2019 2004   GLUCOSEU NEGATIVE 11/22/2019 2004   HGBUR NEGATIVE 11/22/2019 2004   BILIRUBINUR NEGATIVE 11/22/2019 2004   KETONESUR NEGATIVE 11/22/2019 2004   PROTEINUR NEGATIVE 11/22/2019 2004   NITRITE NEGATIVE 11/22/2019 2004   LEUKOCYTESUR NEGATIVE 11/22/2019 2004   Sepsis Labs Invalid input(s): PROCALCITONIN,  WBC,  LACTICIDVEN Microbiology Recent Results (from the past 240 hour(s))  SARS CORONAVIRUS 2 (TAT 6-24 HRS) Nasopharyngeal Nasopharyngeal Swab     Status: None   Collection Time: 11/22/19 10:58 PM   Specimen: Nasopharyngeal Swab  Result Value Ref Range Status   SARS Coronavirus 2 NEGATIVE NEGATIVE Final    Comment: (NOTE) SARS-CoV-2 target nucleic acids are NOT DETECTED. The SARS-CoV-2 RNA is generally detectable in upper and lower respiratory specimens during the acute phase of infection. Negative results do not preclude SARS-CoV-2 infection, do not rule out co-infections with other pathogens, and should not be used as the sole basis for treatment or other patient management decisions. Negative results must be combined with clinical observations, patient history, and epidemiological information. The expected result is Negative. Fact Sheet for Patients: HairSlick.no Fact Sheet for Healthcare  Providers: quierodirigir.com This test is not yet approved or cleared by the [place] FDA and  has been authorized for detection and/or diagnosis of SARS-CoV-2 by FDA under an Emergency Use Authorization (EUA). This EUA will remain  in effect (meaning this test can be used) for the duration of the COVID-19 declaration under Section 56 4(b)(1) of the Act, 21 U.S.C. section 360bbb-3(b)(1), unless the authorization is terminated or revoked sooner. Performed at [facility], [address], [place], [place] [address]   MRSA PCR Screening     Status: None   Collection Time: 12/01/19 12:22 AM   Specimen: Nasal Mucosa; Nasopharyngeal  Result Value Ref Range Status   MRSA by PCR NEGATIVE NEGATIVE Final    Comment:        The GeneXpert MRSA Assay (FDA approved for NASAL specimens only), is one component of a comprehensive MRSA colonization surveillance program. It is not intended to diagnose MRSA infection nor to guide or monitor treatment for MRSA infections. Performed at [facility], [address] [place], [place] [address]   SARS CORONAVIRUS 2 (TAT 6-24 HRS) Nasopharyngeal Nasopharyngeal Swab  Status: None   Collection Time: 12/01/19  4:33 AM   Specimen: Nasopharyngeal Swab  Result Value Ref Range Status   SARS Coronavirus 2 NEGATIVE NEGATIVE Final    Comment: (NOTE) SARS-CoV-2 target nucleic acids are NOT DETECTED. The SARS-CoV-2 RNA is generally detectable in upper and lower respiratory specimens during the acute phase of infection. Negative results do not preclude SARS-CoV-2 infection, do not rule out co-infections with other pathogens, and should not be used as the sole basis for treatment or other patient management decisions. Negative results must be combined with clinical observations, patient history, and epidemiological information. The expected result is Negative. Fact Sheet for  Patients: HairSlick.no Fact Sheet for Healthcare Providers: quierodirigir.com This test is not yet approved or cleared by the [place] FDA and  has been authorized for detection and/or diagnosis of SARS-CoV-2 by FDA under an Emergency Use Authorization (EUA). This EUA will remain  in effect (meaning this test can be used) for the duration of the COVID-19 declaration under Section 56 4(b)(1) of the Act, 21 U.S.C. section 360bbb-3(b)(1), unless the authorization is terminated or revoked sooner. Performed at [facility], [address], [place], [place] [address]      Time coordinating discharge: 25  minutes  SIGNED: Lanae Boast, MD  [facility] 12/01/2019, 2:10 PM  If 7PM-7AM, please contact night-coverage [email or web address]

## 2019-12-01 NOTE — Plan of Care (Signed)

## 2019-12-01 NOTE — Care Management Obs Status (Signed)
MEDICARE OBSERVATION STATUS NOTIFICATION   Patient Details  Name: Brooke Gray MRN: [id number] Date of Birth: 1945/11/27   Medicare Observation Status Notification Given:  Yes    Baldemar Lenis, LCSW 12/01/2019, 2:45 PM

## 2019-12-01 NOTE — Discharge Instructions (Signed)
   Managing Your Hypertension Hypertension is commonly called high blood pressure. This is when the force of your blood pressing against the walls of your arteries is too strong. Arteries are blood vessels that carry blood from your heart throughout your body. Hypertension forces the heart to work harder to pump blood, and may cause the arteries to become narrow or stiff. Having untreated or uncontrolled hypertension can cause heart attack, stroke, kidney disease, and other problems. What are blood pressure readings? A blood pressure reading consists of a higher number over a lower number. Ideally, your blood pressure should be below 120/80. The first ("top") number is called the systolic pressure. It is a measure of the pressure in your arteries as your heart beats. The second ("bottom") number is called the diastolic pressure. It is a measure of the pressure in your arteries as the heart relaxes. What does my blood pressure reading mean? Blood pressure is classified into four stages. Based on your blood pressure reading, your health care provider may use the following stages to determine what type of treatment you need, if any. Systolic pressure and diastolic pressure are measured in a unit called mm Hg. Normal  Systolic pressure: below 120.  Diastolic pressure: below 80. Elevated  Systolic pressure: 120-129.  Diastolic pressure: below 80. Hypertension stage 1  Systolic pressure: 130-139.  Diastolic pressure: 80-89. Hypertension stage 2  Systolic pressure: 140 or above.  Diastolic pressure: 90 or above. What health risks are associated with hypertension? Managing your hypertension is an important responsibility. Uncontrolled hypertension can lead to:  A heart attack.  A stroke.  A weakened blood vessel (aneurysm).  Heart failure.  Kidney damage.  Eye damage.  Metabolic syndrome.  Memory and concentration problems. What changes can I make to manage my  hypertension? Hypertension can be managed by making lifestyle changes and possibly by taking medicines. Your health care provider will help you make a plan to bring your blood pressure within a normal range. Eating and drinking   Eat a diet that is high in fiber and potassium, and low in salt (sodium), added sugar, and fat. An example eating plan is called the DASH (Dietary Approaches to Stop Hypertension) diet. To eat this way: ? Eat plenty of fresh fruits and vegetables. Try to fill half of your plate at each meal with fruits and vegetables. ? Eat whole grains, such as whole wheat pasta, brown rice, or whole grain bread. Fill about one quarter of your plate with whole grains. ? Eat low-fat diary products. ? Avoid fatty cuts of meat, processed or cured meats, and poultry with skin. Fill about one quarter of your plate with lean proteins such as fish, chicken without skin, beans, eggs, and tofu. ? Avoid premade and processed foods. These tend to be higher in sodium, added sugar, and fat.  Reduce your daily sodium intake. Most people with hypertension should eat less than 1,500 mg of sodium a day.  Limit alcohol intake to no more than 1 drink a day for nonpregnant women and 2 drinks a day for men. One drink equals 12 oz of beer, 5 oz of wine, or 1 oz of hard liquor. Lifestyle  Work with your health care provider to maintain a healthy body weight, or to lose weight. Ask what an ideal weight is for you.  Get at least 30 minutes of exercise that causes your heart to beat faster (aerobic exercise) most days of the week. Activities may include walking, swimming, or biking.    Include exercise to strengthen your muscles (resistance exercise), such as weight lifting, as part of your weekly exercise routine. Try to do these types of exercises for 30 minutes at least 3 days a week.  Do not use any products that contain nicotine or tobacco, such as cigarettes and e-cigarettes. If you need help quitting,  ask your health care provider.  Control any long-term (chronic) conditions you have, such as high cholesterol or diabetes. Monitoring  Monitor your blood pressure at home as told by your health care provider. Your personal target blood pressure may vary depending on your medical conditions, your age, and other factors.  Have your blood pressure checked regularly, as often as told by your health care provider. Working with your health care provider  Review all the medicines you take with your health care provider because there may be side effects or interactions.  Talk with your health care provider about your diet, exercise habits, and other lifestyle factors that may be contributing to hypertension.  Visit your health care provider regularly. Your health care provider can help you create and adjust your plan for managing hypertension. Will I need medicine to control my blood pressure? Your health care provider may prescribe medicine if lifestyle changes are not enough to get your blood pressure under control, and if:  Your systolic blood pressure is 130 or higher.  Your diastolic blood pressure is 80 or higher. Take medicines only as told by your health care provider. Follow the directions carefully. Blood pressure medicines must be taken as prescribed. The medicine does not work as well when you skip doses. Skipping doses also puts you at risk for problems. Contact a health care provider if:  You think you are having a reaction to medicines you have taken.  You have repeated (recurrent) headaches.  You feel dizzy.  You have swelling in your ankles.  You have trouble with your vision. Get help right away if:  You develop a severe headache or confusion.  You have unusual weakness or numbness, or you feel faint.  You have severe pain in your chest or abdomen.  You vomit repeatedly.  You have trouble breathing. Summary  Hypertension is when the force of blood pumping  through your arteries is too strong. If this condition is not controlled, it may put you at risk for serious complications.  Your personal target blood pressure may vary depending on your medical conditions, your age, and other factors. For most people, a normal blood pressure is less than 120/80.  Hypertension is managed by lifestyle changes, medicines, or both. Lifestyle changes include weight loss, eating a healthy, low-sodium diet, exercising more, and limiting alcohol. This information is not intended to replace advice given to you by your health care provider. Make sure you discuss any questions you have with your health care provider. Document Revised: 02/22/2019 Document Reviewed: 09/28/2016 Elsevier Patient Education  2020 Elsevier Inc.  

## 2019-12-04 DIAGNOSIS — E7800 Pure hypercholesterolemia, unspecified: Secondary | ICD-10-CM | POA: Diagnosis not present

## 2019-12-04 DIAGNOSIS — Z8673 Personal history of transient ischemic attack (TIA), and cerebral infarction without residual deficits: Secondary | ICD-10-CM | POA: Diagnosis not present

## 2019-12-04 DIAGNOSIS — I10 Essential (primary) hypertension: Secondary | ICD-10-CM | POA: Diagnosis not present

## 2019-12-04 DIAGNOSIS — R7303 Prediabetes: Secondary | ICD-10-CM | POA: Diagnosis not present

## 2019-12-04 DIAGNOSIS — I119 Hypertensive heart disease without heart failure: Secondary | ICD-10-CM | POA: Diagnosis not present

## 2019-12-04 DIAGNOSIS — Z87891 Personal history of nicotine dependence: Secondary | ICD-10-CM | POA: Diagnosis not present

## 2019-12-06 DIAGNOSIS — R7303 Prediabetes: Secondary | ICD-10-CM | POA: Diagnosis not present

## 2019-12-06 DIAGNOSIS — I10 Essential (primary) hypertension: Secondary | ICD-10-CM | POA: Diagnosis not present

## 2019-12-06 DIAGNOSIS — I119 Hypertensive heart disease without heart failure: Secondary | ICD-10-CM | POA: Diagnosis not present

## 2019-12-06 DIAGNOSIS — E7800 Pure hypercholesterolemia, unspecified: Secondary | ICD-10-CM | POA: Diagnosis not present

## 2019-12-06 DIAGNOSIS — Z8673 Personal history of transient ischemic attack (TIA), and cerebral infarction without residual deficits: Secondary | ICD-10-CM | POA: Diagnosis not present

## 2019-12-06 DIAGNOSIS — Z87891 Personal history of nicotine dependence: Secondary | ICD-10-CM | POA: Diagnosis not present

## 2019-12-18 DIAGNOSIS — E7800 Pure hypercholesterolemia, unspecified: Secondary | ICD-10-CM | POA: Diagnosis not present

## 2019-12-18 DIAGNOSIS — I10 Essential (primary) hypertension: Secondary | ICD-10-CM | POA: Diagnosis not present

## 2019-12-18 DIAGNOSIS — Z8673 Personal history of transient ischemic attack (TIA), and cerebral infarction without residual deficits: Secondary | ICD-10-CM | POA: Diagnosis not present

## 2019-12-18 DIAGNOSIS — I119 Hypertensive heart disease without heart failure: Secondary | ICD-10-CM | POA: Diagnosis not present

## 2019-12-18 DIAGNOSIS — R7303 Prediabetes: Secondary | ICD-10-CM | POA: Diagnosis not present

## 2019-12-18 DIAGNOSIS — Z87891 Personal history of nicotine dependence: Secondary | ICD-10-CM | POA: Diagnosis not present

## 2019-12-19 ENCOUNTER — Ambulatory Visit: Payer: Medicare HMO | Admitting: Cardiology

## 2019-12-25 DIAGNOSIS — Z8673 Personal history of transient ischemic attack (TIA), and cerebral infarction without residual deficits: Secondary | ICD-10-CM | POA: Diagnosis not present

## 2019-12-25 DIAGNOSIS — R7303 Prediabetes: Secondary | ICD-10-CM | POA: Diagnosis not present

## 2019-12-25 DIAGNOSIS — Z87891 Personal history of nicotine dependence: Secondary | ICD-10-CM | POA: Diagnosis not present

## 2019-12-25 DIAGNOSIS — E7800 Pure hypercholesterolemia, unspecified: Secondary | ICD-10-CM | POA: Diagnosis not present

## 2019-12-25 DIAGNOSIS — I10 Essential (primary) hypertension: Secondary | ICD-10-CM | POA: Diagnosis not present

## 2019-12-25 DIAGNOSIS — I119 Hypertensive heart disease without heart failure: Secondary | ICD-10-CM | POA: Diagnosis not present

## 2019-12-29 NOTE — Progress Notes (Signed)
Cardiology Office Note:    Date:  12/30/2019   ID:  Brooke Gray, DOB 1946-05-28, MRN [id number]  PCP:  Jackie Plum, MD  Cardiologist:  No primary care provider on file.  Electrophysiologist:  None   Referring MD: Jackie Plum, MD   Chief Complaint  Patient presents with  . Hypertension    History of Present Illness:    Brooke Gray is a 74 y.o. female with a hx of TIA (11/2019), hypertension, tobacco use, hyperlipidemia, ascending aortic aneurysm, HFpEF who is referred by Dr. Julio Sicks for evaluation of hypertension.  She was recently admitted to [facility] on 11/30/2019 with shortness of breath, BP up to 181/100.  No chest pain.  High-sensitivity troponin peaked at 255, thought to be demand ischemia due to uncontrolled hypertension.  She also had a recent admit with TIA, was hospitalized from 1/8-1/10/21.  She was discharged on DAPT x3 weeks with plans for been continuing aspirin 81 mg daily and Lipitor.  30-day event monitor was also ordered.  TTE on 11/23/2019 showed normal LV systolic function; grade 1 diastolic dysfunction; severe LVH (mild speckled appearance; suggest cardiac MRI to R/O HCM or amyloid); mildly dilated ascending aorta; moderate LAE.  Since discharge from the hospital, she has been weaned off clonidine and now taking valsartan/hydrochlorothiazide 160/25 mg daily, in addition to amlodipine 5 mg daily.  She brought her blood pressure log in with her, has been controlled (110s to 130s/70s to 90s).  Denies any chest pain, dyspnea, lightheadedness, syncope, palpitations.   Past Medical History:  Diagnosis Date  . Cardiomegaly   . Glaucoma   . Hypertension     Past Surgical History:  Procedure Laterality Date  . no past surgery      Current Medications: Current Meds  Medication Sig  . acetaminophen (TYLENOL) 325 MG tablet Take 2 tablets (650 mg total) by mouth every 4 (four) hours as needed for mild pain (or temp > 37.5 C (99.5 F)).  Marland Kitchen amLODipine  (NORVASC) 5 MG tablet Take 5 mg by mouth daily.  Marland Kitchen atorvastatin (LIPITOR) 40 MG tablet Take 1 tablet (40 mg total) by mouth daily at 6 PM.  . Celery Seed OIL by Does not apply route.  . Cholecalciferol (VITAMIN D3 PO) Take 5,000 mcg by mouth daily.   . Cyanocobalamin (VITAMIN B-12 PO) Take 1 tablet by mouth 3 (three) times a week.   . diazepam (VALIUM) 5 MG tablet Take 5 mg by mouth every 12 (twelve) hours as needed for muscle spasms (sleep).   . latanoprost (XALATAN) 0.005 % ophthalmic solution Place 1 drop into both eyes at bedtime.   . Magnesium 400 MG CAPS Take 400 mg by mouth daily.   . valsartan-hydrochlorothiazide (DIOVAN-HCT) 160-25 MG tablet Take 1 tablet by mouth daily.  Marland Kitchen zinc sulfate (ZINC-220) 220 (50 Zn) MG capsule Take 220 mg by mouth daily.  . [DISCONTINUED] amLODipine (NORVASC) 10 MG tablet Take 1 tablet (10 mg total) by mouth daily.  . [DISCONTINUED] clopidogrel (PLAVIX) 75 MG tablet Take 1 tablet (75 mg total) by mouth daily.     Allergies:   Penicillins   Social History   Socioeconomic History  . Marital status: Married    Spouse name: Not on file  . Number of children: Not on file  . Years of education: Not on file  . Highest education level: Not on file  Occupational History  . Not on file  Tobacco Use  . Smoking status: Current Every Day Smoker    Packs/day:  0.25    Years: 30.00    Pack years: 7.50    Types: Cigarettes  . Smokeless tobacco: Never Used  Substance and Sexual Activity  . Alcohol use: Yes  . Drug use: No  . Sexual activity: Not on file  Other Topics Concern  . Not on file  Social History Narrative  . Not on file   Social Determinants of Health   Financial Resource Strain:   . Difficulty of Paying Living Expenses: Not on file  Food Insecurity:   . Worried About Charity fundraiser in the Last Year: Not on file  . Ran Out of Food in the Last Year: Not on file  Transportation Needs:   . Lack of Transportation (Medical): Not on file    . Lack of Transportation (Non-Medical): Not on file  Physical Activity:   . Days of Exercise per Week: Not on file  . Minutes of Exercise per Session: Not on file  Stress:   . Feeling of Stress : Not on file  Social Connections:   . Frequency of Communication with Friends and Family: Not on file  . Frequency of Social Gatherings with Friends and Family: Not on file  . Attends Religious Services: Not on file  . Active Member of Clubs or Organizations: Not on file  . Attends Archivist Meetings: Not on file  . Marital Status: Not on file     Family History: The patient's family history includes Allergic rhinitis in her son. There is no history of Angioedema, Asthma, Eczema, Immunodeficiency, or Urticaria.  ROS:   Please see the history of present illness.     All other systems reviewed and are negative.  EKGs/Labs/Other Studies Reviewed:    The following studies were reviewed today:   EKG:  EKG is ordered today.  The ekg ordered today demonstrates sinus rhythm, rate 58, Q waves in V1/2, left atrial enlargement  Recent Labs: 11/30/2019: ALT 26; B Natriuretic Peptide 406.5 12/01/2019: BUN 15; Creatinine, Ser 0.77; Hemoglobin 13.5; Platelets 125; Potassium 4.1; Sodium 141  Recent Lipid Panel    Component Value Date/Time   CHOL 232 (H) 11/23/2019 1252   TRIG 55 11/23/2019 1252   HDL 77 11/23/2019 1252   CHOLHDL 3.0 11/23/2019 1252   VLDL 11 11/23/2019 1252   LDLCALC 144 (H) 11/23/2019 1252   TTE 11/23/19: 1. Left ventricular ejection fraction, by visual estimation, is 60 to 65%. The left ventricle has normal function. There is severely increased left ventricular hypertrophy. 2. Elevated left atrial pressure. 3. Left ventricular diastolic parameters are consistent with Grade I diastolic dysfunction (impaired relaxation). 4. Global right ventricle has normal systolic function.The right ventricular size is normal. 5. Left atrial size was moderately dilated. 6.  Right atrial size was normal. 7. Trivial pericardial effusion is present. 8. The mitral valve is normal in structure. Mild mitral valve regurgitation. No evidence of mitral stenosis. 9. The tricuspid valve is normal in structure. 10. The aortic valve is tricuspid. Aortic valve regurgitation is not visualized. No evidence of aortic valve sclerosis or stenosis. 11. The pulmonic valve was normal in structure. Pulmonic valve regurgitation is not visualized. 12. Aortic dilatation noted. 13. There is mild dilatation of the ascending aorta measuring 38 mm. 14. The inferior vena cava is normal in size with greater than 50% respiratory variability, suggesting right atrial pressure of 3 mmHg. 15. Normal LV systolic function; grade 1 diastolic dysfunction; severe LVH (mild speckled appearance; suggest cardiac MRI to R/O HCM or  amyloid); mildly dilated ascending aorta; moderate LAE. 16. The left ventricle has no regional wall motion abnormalities.  Physical Exam:    VS:  BP 131/82 (BP Location: Right Arm)   Ht 5\' 3"  (1.6 m)   Wt 129 lb 3.2 oz (58.6 kg)   SpO2 100%   BMI 22.89 kg/m     Wt Readings from Last 3 Encounters:  12/30/19 129 lb 3.2 oz (58.6 kg)  12/01/19 128 lb 1.4 oz (58.1 kg)  11/28/19 130 lb 1.1 oz (59 kg)     GEN:  in no acute distress HEENT: Normal NECK: No JVD LYMPHATICS: No lymphadenopathy CARDIAC: RRR, no murmurs, rubs, gallops RESPIRATORY:  Clear to auscultation without rales, wheezing or rhonchi  ABDOMEN: Soft, non-tender, non-distended MUSCULOSKELETAL:  No edema; No deformity  SKIN: Warm and dry NEUROLOGIC:  Alert and oriented x 3 PSYCHIATRIC:  Normal affect   ASSESSMENT:    1. Essential hypertension   2. LVH (left ventricular hypertrophy)   3. Aortic dilatation (HCC)   4. TIA (transient ischemic attack)   5. Hyperlipidemia, unspecified hyperlipidemia type    PLAN:    Hypertension: On valsartan/hydrochlorothiazide 160/25 mg daily, in addition to amlodipine  5 mg daily.  Appears controlled.   LVH: Severe on TTE.  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 Suspected small aneurysm of the distal supraclinoid left ICA.  On Plavix 75 mg daily and atorvastatin 40 mg daily.  Plan per neurology was to do DAPT x3 weeks then switched to aspirin alone.  Will discontinue Plavix and start aspirin 81 mg daily.  30-day event monitor is pending  Aortic dilatation: Mild dilatation of the ascending aorta on TTE, measuring 38 mm.  Will evaluate further with MRA aorta when doing cardiac MRI as above  Hyperlipidemia: LDL 144 on 11/23/2019.  Started on atorvastatin 40 mg daily  RTC in 3 months  Medication Adjustments/Labs and Tests Ordered: Current medicines are reviewed at length with the patient today.  Concerns regarding medicines are outlined above.  Orders Placed This Encounter  Procedures  . MR Card Morphology Wo/W Cm  . MR ANGIO CHEST W CONTRAST  . EKG 12-Lead   Meds ordered this encounter  Medications  . aspirin 81 MG EC tablet    Sig: Take 1 tablet (81 mg total) by mouth daily.    Dispense:  30 tablet    Refill:  0    Patient Instructions  Medication Instructions:  STOP Plavix Continue Aspirin 81 mg daily  *If you need a refill on your cardiac medications before your next appointment, please call your pharmacy*  Lab Work: NONE  Testing/Procedures: Your physician has requested that you have a cardiac MRI. Cardiac MRI uses a computer to create images of your heart as its beating, producing both still and moving pictures of your heart and major blood vessels. For further information please visit 01/21/2020. Please follow the instruction sheet given to you today for more information.  Your physician has requested that you have a MRA of the Aorta  Follow-Up: At [facility], you and your health needs are our priority.   As part of our continuing mission to provide you with exceptional heart care, we have created designated Provider Care Teams.  These Care Teams include your primary Cardiologist (physician) and Advanced Practice Providers (APPs -  Physician Assistants and Nurse Practitioners) who all work together to provide  you with the care you need, when you need it.  Your next appointment:   3 month(s)  The format for your next appointment:   In Person  Provider:   Epifanio Lesches, MD       Signed, Little Ishikawa, MD  12/30/2019 5:50 PM    [facility]

## 2019-12-30 ENCOUNTER — Ambulatory Visit (INDEPENDENT_AMBULATORY_CARE_PROVIDER_SITE_OTHER): Payer: Medicare HMO

## 2019-12-30 ENCOUNTER — Ambulatory Visit (INDEPENDENT_AMBULATORY_CARE_PROVIDER_SITE_OTHER): Payer: Medicare HMO | Admitting: Cardiology

## 2019-12-30 ENCOUNTER — Other Ambulatory Visit: Payer: Self-pay

## 2019-12-30 ENCOUNTER — Encounter: Payer: Self-pay | Admitting: Cardiology

## 2019-12-30 VITALS — BP 131/82 | Ht 63.0 in | Wt 129.2 lb

## 2019-12-30 DIAGNOSIS — I4891 Unspecified atrial fibrillation: Secondary | ICD-10-CM

## 2019-12-30 DIAGNOSIS — I517 Cardiomegaly: Secondary | ICD-10-CM | POA: Diagnosis not present

## 2019-12-30 DIAGNOSIS — E785 Hyperlipidemia, unspecified: Secondary | ICD-10-CM

## 2019-12-30 DIAGNOSIS — G459 Transient cerebral ischemic attack, unspecified: Secondary | ICD-10-CM | POA: Diagnosis not present

## 2019-12-30 DIAGNOSIS — I10 Essential (primary) hypertension: Secondary | ICD-10-CM | POA: Diagnosis not present

## 2019-12-30 DIAGNOSIS — I77819 Aortic ectasia, unspecified site: Secondary | ICD-10-CM | POA: Diagnosis not present

## 2019-12-30 MED ORDER — ASPIRIN 81 MG PO TBEC: 81.0000 mg | DELAYED_RELEASE_TABLET | Freq: Every day | ORAL | 0 refills | Status: DC

## 2019-12-30 NOTE — Patient Instructions (Signed)
Medication Instructions:  STOP Plavix Continue Aspirin 81 mg daily  *If you need a refill on your cardiac medications before your next appointment, please call your pharmacy*  Lab Work: NONE  Testing/Procedures: Your physician has requested that you have a cardiac MRI. Cardiac MRI uses a computer to create images of your heart as its beating, producing both still and moving pictures of your heart and major blood vessels. For further information please visit [email or web address]. Please follow the instruction sheet given to you today for more information.  Your physician has requested that you have a MRA of the Aorta  Follow-Up: At [facility], you and your health needs are our priority.  As part of our continuing mission to provide you with exceptional heart care, we have created designated Provider Care Teams.  These Care Teams include your primary Cardiologist (physician) and Advanced Practice Providers (APPs -  Physician Assistants and Nurse Practitioners) who all work together to provide you with the care you need, when you need it.  Your next appointment:   3 month(s)  The format for your next appointment:   In Person  Provider:   Epifanio Lesches, MD

## 2020-01-02 ENCOUNTER — Telehealth: Payer: Self-pay | Admitting: Physician Assistant

## 2020-01-02 ENCOUNTER — Inpatient Hospital Stay: Payer: Medicare HMO | Admitting: Adult Health

## 2020-01-02 NOTE — Telephone Encounter (Signed)
New Message   Patient is calling because she is having issues with getting her event monitor set up. Please call to discuss

## 2020-01-02 NOTE — Telephone Encounter (Signed)
Patients monitor is working at this time.  Reviewed process of switching monitor batteries with patient .  Reviewed how to access how to videos on smart phone.  Patient put monitor battery #2 on charger.  She will be switching out monitor batteries tomorrow.

## 2020-01-07 ENCOUNTER — Telehealth: Payer: Self-pay | Admitting: Cardiology

## 2020-01-07 ENCOUNTER — Inpatient Hospital Stay: Payer: Medicare HMO | Admitting: Neurology

## 2020-01-07 ENCOUNTER — Telehealth: Payer: Self-pay

## 2020-01-07 ENCOUNTER — Telehealth: Payer: Self-pay | Admitting: Neurology

## 2020-01-07 NOTE — Telephone Encounter (Signed)
I do not see the rhythm strip, where can I find it?

## 2020-01-07 NOTE — Telephone Encounter (Signed)
Noted  

## 2020-01-07 NOTE — Telephone Encounter (Signed)
PEr phone room notes pt had COVID 19 recently and cancel the same day. Marked as no show for appt.

## 2020-01-07 NOTE — Telephone Encounter (Addendum)
Spoke to  [employer] ( rep for [employer])  She states first  Strip  During business hours for the  Patient.  strip showed  afib at rate 185  At 2:58 pm central time  strip faxed to [phone] .   awaiting for strip to  Sent To show to physician.   Spoke to patient -she states he was exercising , dancing for 1 hour and she will being that  little later this evening  Patient states she she not happy  Concerning the monitor . She is not pleased she has to maintain the monitor  RN informed her may call back to tomorrow if any changes is need  she state she is not on medication and she is not going to atke any medication.

## 2020-01-07 NOTE — Telephone Encounter (Signed)
Pt has called stating she recently had Covid-19 vaccine and is feeling lethargic.  Pt will call back to r/s her appointment, this is FYI no call back requested

## 2020-01-07 NOTE — Telephone Encounter (Signed)
[employer] is calling with a critical EKG results.

## 2020-01-08 ENCOUNTER — Ambulatory Visit (HOSPITAL_COMMUNITY)
Admission: RE | Admit: 2020-01-08 | Discharge: 2020-01-08 | Disposition: A | Discharge status: Home / Self Care | Payer: Medicare HMO | Source: Ambulatory Visit | Attending: Nurse Practitioner | Admitting: Nurse Practitioner

## 2020-01-08 ENCOUNTER — Encounter (HOSPITAL_COMMUNITY): Payer: Self-pay | Admitting: Nurse Practitioner

## 2020-01-08 ENCOUNTER — Other Ambulatory Visit: Payer: Self-pay

## 2020-01-08 VITALS — BP 136/78 | HR 64 | Ht 63.0 in | Wt 129.4 lb

## 2020-01-08 DIAGNOSIS — I480 Paroxysmal atrial fibrillation: Secondary | ICD-10-CM | POA: Diagnosis not present

## 2020-01-08 DIAGNOSIS — Z8673 Personal history of transient ischemic attack (TIA), and cerebral infarction without residual deficits: Secondary | ICD-10-CM | POA: Diagnosis not present

## 2020-01-08 DIAGNOSIS — I509 Heart failure, unspecified: Secondary | ICD-10-CM | POA: Insufficient documentation

## 2020-01-08 DIAGNOSIS — F17210 Nicotine dependence, cigarettes, uncomplicated: Secondary | ICD-10-CM | POA: Insufficient documentation

## 2020-01-08 DIAGNOSIS — I4891 Unspecified atrial fibrillation: Secondary | ICD-10-CM | POA: Diagnosis present

## 2020-01-08 DIAGNOSIS — H409 Unspecified glaucoma: Secondary | ICD-10-CM | POA: Insufficient documentation

## 2020-01-08 DIAGNOSIS — Z79899 Other long term (current) drug therapy: Secondary | ICD-10-CM | POA: Diagnosis not present

## 2020-01-08 DIAGNOSIS — Z880 Allergy status to penicillin: Secondary | ICD-10-CM | POA: Diagnosis not present

## 2020-01-08 DIAGNOSIS — I671 Cerebral aneurysm, nonruptured: Secondary | ICD-10-CM | POA: Diagnosis not present

## 2020-01-08 DIAGNOSIS — I110 Hypertensive heart disease with heart failure: Secondary | ICD-10-CM | POA: Diagnosis not present

## 2020-01-08 DIAGNOSIS — Z7982 Long term (current) use of aspirin: Secondary | ICD-10-CM | POA: Diagnosis not present

## 2020-01-08 MED ORDER — DILTIAZEM HCL ER COATED BEADS 120 MG PO CP24: 120.0000 mg | ORAL_CAPSULE | Freq: Every day | ORAL | 3 refills | Status: DC

## 2020-01-08 MED ORDER — APIXABAN 5 MG PO TABS: 5.0000 mg | ORAL_TABLET | Freq: Two times a day (BID) | ORAL | 3 refills | Status: DC

## 2020-01-08 NOTE — Patient Instructions (Signed)
STOP amlodipine STOP aspirin  START Cardizem (Diltiazem) 120mg  once a day START Eliquis 5mg  twice a day

## 2020-01-08 NOTE — Progress Notes (Signed)
Primary Care Physician: Jackie Plum, MD Referring Physician: Dr. Ermalinda Barrios Brooke Gray is a 74 y.o. female with a h/o HTN, LVH, CHF, that is in the afib clinic for afib showing on an event monitor 2/22 and 2/23  that she is still currently wearing. This monitor was ordered early January when she was in [facility] with a TIA. She  is currently not on rate control or anticoagualtion with a CHA2DS2VASc score of 6.  She was in the hospital again mid January with hypertensive emergency. It was recommended that no rate control meds be used for control of BP as she had HR's in SR in the upper 40's and low 50's.  Echo showed severe LVH and mild dilation of the  Ascending aorta measuring 38 mm. Clonidine was stopped and amlodipine was added, losartan stopped and valsartan/hctz was added. Her BP has been better controlled with these meds and today is 136/78.  She has been asymptomatic with the episodes of afib. One e[pisode was 2/22 with v rates in the 160's. The next day she was doing a Zumb[employer] class and showed afib again with v rates into the 160-180 range. She is still wearing the monitor for 2 more weeks. She is is SR today at 64 bpm. Her HR is SR via the monitor strips  Is 55 bpm.  She  finds it hard to believe today that she has afib as she does not have symptoms associated with it.     Today, she denies symptoms of palpitations, chest pain, shortness of breath, orthopnea, PND, lower extremity edema, dizziness, presyncope, syncope, or neurologic sequela. The patient is tolerating medications without difficulties and is otherwise without complaint today.   Past Medical History:  Diagnosis Date  . Cardiomegaly   . Glaucoma   . Hypertension    Past Surgical History:  Procedure Laterality Date  . no past surgery      Current Outpatient Medications  Medication Sig Dispense Refill  . acetaminophen (TYLENOL) 325 MG tablet Take 2 tablets (650 mg total) by mouth every 4 (four) hours as needed  for mild pain (or temp > 37.5 C (99.5 F)).    Marland Kitchen amLODipine (NORVASC) 5 MG tablet Take 5 mg by mouth daily.    . Ascorbic Acid (VITAMIN C) 1000 MG tablet Take 1,000 mg by mouth daily.    Marland Kitchen aspirin 81 MG EC tablet Take 1 tablet (81 mg total) by mouth daily. 30 tablet 0  . atorvastatin (LIPITOR) 40 MG tablet Take 1 tablet (40 mg total) by mouth daily at 6 PM. 30 tablet 0  . Celery Seed OIL by Does not apply route.    . Cholecalciferol (VITAMIN D3 PO) Take 5,000 mcg by mouth daily.     . Cyanocobalamin (VITAMIN B-12 PO) Take 1 tablet by mouth 3 (three) times a week.     . diazepam (VALIUM) 5 MG tablet Take 5 mg by mouth every 12 (twelve) hours as needed for muscle spasms (sleep).     . latanoprost (XALATAN) 0.005 % ophthalmic solution Place 1 drop into both eyes at bedtime.     . Magnesium 400 MG CAPS Take 400 mg by mouth daily.     . valsartan-hydrochlorothiazide (DIOVAN-HCT) 160-25 MG tablet Take 1 tablet by mouth daily.    Marland Kitchen zinc sulfate (ZINC-220) 220 (50 Zn) MG capsule Take 220 mg by mouth daily.     No current facility-administered medications for this encounter.    Allergies  Allergen Reactions  .  Penicillins Hives    Social History   Socioeconomic History  . Marital status: Married    Spouse name: Not on file  . Number of children: Not on file  . Years of education: Not on file  . Highest education level: Not on file  Occupational History  . Not on file  Tobacco Use  . Smoking status: Current Every Day Smoker    Packs/day: 0.25    Years: 30.00    Pack years: 7.50    Types: Cigarettes  . Smokeless tobacco: Never Used  Substance and Sexual Activity  . Alcohol use: Yes  . Drug use: No  . Sexual activity: Not on file  Other Topics Concern  . Not on file  Social History Narrative  . Not on file   Social Determinants of Health   Financial Resource Strain:   . Difficulty of Paying Living Expenses: Not on file  Food Insecurity:   . Worried About Programme researcher, broadcasting/film/video in  the Last Year: Not on file  . Ran Out of Food in the Last Year: Not on file  Transportation Needs:   . Lack of Transportation (Medical): Not on file  . Lack of Transportation (Non-Medical): Not on file  Physical Activity:   . Days of Exercise per Week: Not on file  . Minutes of Exercise per Session: Not on file  Stress:   . Feeling of Stress : Not on file  Social Connections:   . Frequency of Communication with Friends and Family: Not on file  . Frequency of Social Gatherings with Friends and Family: Not on file  . Attends Religious Services: Not on file  . Active Member of Clubs or Organizations: Not on file  . Attends Banker Meetings: Not on file  . Marital Status: Not on file  Intimate Partner Violence:   . Fear of Current or Ex-Partner: Not on file  . Emotionally Abused: Not on file  . Physically Abused: Not on file  . Sexually Abused: Not on file    Family History  Problem Relation Age of Onset  . Allergic rhinitis Son   . Angioedema Neg Hx   . Asthma Neg Hx   . Eczema Neg Hx   . Immunodeficiency Neg Hx   . Urticaria Neg Hx     ROS- All systems are reviewed and negative except as per the HPI above  Physical Exam: There were no vitals filed for this visit. Wt Readings from Last 3 Encounters:  12/30/19 58.6 kg  12/01/19 58.1 kg  11/28/19 59 kg    Labs: Lab Results  Component Value Date   NA 141 12/01/2019   K 4.1 12/01/2019   CL 107 12/01/2019   CO2 24 12/01/2019   GLUCOSE 94 12/01/2019   BUN 15 12/01/2019   CREATININE 0.77 12/01/2019   CALCIUM 9.8 12/01/2019   No results found for: INR Lab Results  Component Value Date   CHOL 232 (H) 11/23/2019   HDL 77 11/23/2019   LDLCALC 144 (H) 11/23/2019   TRIG 55 11/23/2019     GEN- The patient is well appearing, alert and oriented x 3 today.   Head- normocephalic, atraumatic Eyes-  Sclera clear, conjunctiva pink Ears- hearing intact Oropharynx- clear Neck- supple, no JVP Lymph- no  cervical lymphadenopathy Lungs- Clear to ausculation bilaterally, normal work of breathing Heart- Regular rate and rhythm, no murmurs, rubs or gallops, PMI not laterally displaced GI- soft, NT, ND, + BS Extremities- no clubbing, cyanosis, or  edema MS- no significant deformity or atrophy Skin- no rash or lesion Psych- euthymic mood, full affect Neuro- strength and sensation are intact  EKG-Faxed [employer] strips form Dr. Newman Nickels office show mostly SR at 55 bpm, episodes of afib 2/22 and 2/23 with v rates of 160-180 bpm. 2 runs of VT  of 4-5 beats.  Echo-1. Left ventricular ejection fraction, by visual estimation, is 60 to  65%. The left ventricle has normal function. There is severely increased  left ventricular hypertrophy.  2. Elevated left atrial pressure.  3. Left ventricular diastolic parameters are consistent with Grade I  diastolic dysfunction (impaired relaxation).  4. Global right ventricle has normal systolic function.The right  ventricular size is normal.  5. Left atrial size was moderately dilated.  6. Right atrial size was normal.  7. Trivial pericardial effusion is present.  8. The mitral valve is normal in structure. Mild mitral valve  regurgitation. No evidence of mitral stenosis.  9. The tricuspid valve is normal in structure.  10. The aortic valve is tricuspid. Aortic valve regurgitation is not  visualized. No evidence of aortic valve sclerosis or stenosis.  11. The pulmonic valve was normal in structure. Pulmonic valve  regurgitation is not visualized.  12. Aortic dilatation noted.  13. There is mild dilatation of the ascending aorta measuring 38 mm.  14. The inferior vena cava is normal in size with greater than 50%  respiratory variability, suggesting right atrial pressure of 3 mmHg.  15. Normal LV systolic function; grade 1 diastolic dysfunction; severe LVH  (mild speckled appearance; suggest cardiac MRI to R/O HCM or amyloid);  mildly dilated  ascending aorta; moderate LAE.  16. The left ventricle has no regional wall motion abnormalities.   FINDINGS  Left Ventricle: Left ventricular ejection fraction, by visual estimation,  is 60 to 65%. The left ventricle has normal function. The left ventricle  has no regional wall motion abnormalities. There is severely increased  left ventricular hypertrophy. Left  ventricular diastolic parameters are consistent with Grade I diastolic  dysfunction (impaired relaxation). Elevated left atrial pressure.     Assessment and Plan: 1. Newly diagnosed asymptomatic afib with TIA January 2021 Pt wearing event monitor of 4 weeks, currently 2 weeks left to wear   Has shown paroxymal afib with v rates of 160-180 bpm  I initially was going to stop amlodipine and start Cardizem 120 mg daily but then read a consult with Dr. Curt Bears mid January  that  BB/CCB were on hold  for control of BP as she was having high 40's low 50's HR's in SR . Cardizem  is on hold. I have spoken to Dr. Curt Bears and he will look into her chart and let me know plan to manage high v rates when in afib.  2. CHA2DS2VASc score of 6 Discussed with pt stopping ASA and starting eliquis 5 mg bid I saw by MR of brain that she has a small aneurysm of the rt cavernous ICA and suspected small aneurysm of the distal supraclinoid Left ICA. I discussed with Dr. Erlinda Hong and he felt  no contraindication of DOAC was indicated as both aneurysms were both very small.  Pt denies a bleeding history Bleeding precautions were discussed  Avoid antiinflammatories, tylenol if indicated for pain   3. HTN Stable today  I will update plan when I hear from Dr. Curt Bears and otherwise continue to wear monitor and I will see back in 2 weeks   Brooke Gray, [place]  [facility] [address] [place], [place] [address] [phone]

## 2020-01-08 NOTE — Telephone Encounter (Signed)
Strips reviewed by Dr. Bjorn Pippin, recommend Afib clinic today if possible.  Spoke to patient, agreeable to see Rudi Coco NP in Afib clinic today at 3:30 pm.  Instructions given to husband.  Monitor strips faxed to Afib clinic.

## 2020-01-09 ENCOUNTER — Other Ambulatory Visit (HOSPITAL_COMMUNITY): Payer: Self-pay | Admitting: *Deleted

## 2020-01-09 ENCOUNTER — Telehealth: Payer: Self-pay | Admitting: Cardiology

## 2020-01-09 ENCOUNTER — Encounter (HOSPITAL_COMMUNITY): Payer: Self-pay | Admitting: Nurse Practitioner

## 2020-01-09 DIAGNOSIS — R7303 Prediabetes: Secondary | ICD-10-CM | POA: Diagnosis not present

## 2020-01-09 DIAGNOSIS — I10 Essential (primary) hypertension: Secondary | ICD-10-CM | POA: Diagnosis not present

## 2020-01-09 DIAGNOSIS — I119 Hypertensive heart disease without heart failure: Secondary | ICD-10-CM | POA: Diagnosis not present

## 2020-01-09 DIAGNOSIS — I480 Paroxysmal atrial fibrillation: Secondary | ICD-10-CM | POA: Diagnosis not present

## 2020-01-09 DIAGNOSIS — Z87891 Personal history of nicotine dependence: Secondary | ICD-10-CM | POA: Diagnosis not present

## 2020-01-09 DIAGNOSIS — E7800 Pure hypercholesterolemia, unspecified: Secondary | ICD-10-CM | POA: Diagnosis not present

## 2020-01-09 DIAGNOSIS — Z8673 Personal history of transient ischemic attack (TIA), and cerebral infarction without residual deficits: Secondary | ICD-10-CM | POA: Diagnosis not present

## 2020-01-09 MED ORDER — AMLODIPINE BESYLATE 5 MG PO TABS: 5.0000 mg | ORAL_TABLET | Freq: Every day | ORAL | 11 refills | Status: DC

## 2020-01-09 NOTE — Telephone Encounter (Signed)
Patient calling stating she does not want to go back to see the NP Rudi Coco at the afib clinic. She says she wants to see a doctor or nothing. She also states her appointment with Rudi Coco was, because her heart monitor showed an afib episode. She believes this was probably just her exercising or related to her covid vaccine. She states she does not feel comfortable with her medications being changed by her. Please advise.

## 2020-01-10 NOTE — Telephone Encounter (Signed)
Please let patient know that I agree with Teretha's plan and strongly encourage her to follow it

## 2020-01-13 ENCOUNTER — Telehealth: Payer: Self-pay | Admitting: *Deleted

## 2020-01-13 ENCOUNTER — Encounter: Payer: Self-pay | Admitting: Cardiology

## 2020-01-13 ENCOUNTER — Other Ambulatory Visit: Payer: Self-pay | Admitting: *Deleted

## 2020-01-13 DIAGNOSIS — I77819 Aortic ectasia, unspecified site: Secondary | ICD-10-CM

## 2020-01-13 NOTE — Telephone Encounter (Signed)
Left message for patient to call regarding appointment for Cardiac MRI and CHest MRA scheduled 02/04/20 at 12 :00pm at [facility]

## 2020-01-13 NOTE — Telephone Encounter (Signed)
Spoke with patient regarding appointment for Cardiac MRI and MRA chest scheduled 02/04/20 at 12:00pm at [facility]---arrival time is 11:15 am 1st floor radiology---will also mail information to patient

## 2020-01-17 NOTE — Telephone Encounter (Signed)
OV with Dr. Elberta Fortis 03/08.

## 2020-01-20 ENCOUNTER — Ambulatory Visit: Payer: Medicare HMO | Admitting: Cardiology

## 2020-01-20 DIAGNOSIS — Z7689 Persons encountering health services in other specified circumstances: Secondary | ICD-10-CM | POA: Diagnosis not present

## 2020-01-20 DIAGNOSIS — I10 Essential (primary) hypertension: Secondary | ICD-10-CM | POA: Diagnosis not present

## 2020-01-20 DIAGNOSIS — Z1231 Encounter for screening mammogram for malignant neoplasm of breast: Secondary | ICD-10-CM | POA: Diagnosis not present

## 2020-01-20 DIAGNOSIS — I480 Paroxysmal atrial fibrillation: Secondary | ICD-10-CM | POA: Diagnosis not present

## 2020-01-20 DIAGNOSIS — E782 Mixed hyperlipidemia: Secondary | ICD-10-CM | POA: Diagnosis not present

## 2020-01-20 DIAGNOSIS — Z8673 Personal history of transient ischemic attack (TIA), and cerebral infarction without residual deficits: Secondary | ICD-10-CM | POA: Diagnosis not present

## 2020-01-20 NOTE — Progress Notes (Deleted)
Cardiology Office Note:    Date:  01/20/2020   ID:  Brooke Gray, DOB [birth year], MRN [id number]  PCP:  Benito Mccreedy, MD  Cardiologist:  No primary care provider on file.  Electrophysiologist:  None   Referring MD: Benito Mccreedy, MD   No chief complaint on file.   History of Present Illness:    Brooke Gray is a 74 y.o. female with a hx of TIA (11/2019), hypertension, tobacco use, hyperlipidemia, ascending aortic aneurysm, HFpEF who presents for follow-up.  She was referred by Dr. Vista Lawman for evaluation of hypertension on 12/30/19.  She was admitted to [facility] on 11/30/2019 with shortness of breath, BP up to 181/100.  No chest pain.  High-sensitivity troponin peaked at 255, thought to be demand ischemia due to uncontrolled hypertension.  She also had a recent admit with TIA, was hospitalized from 1/8-1/10/21.  She was discharged on DAPT x3 weeks with plans for been continuing aspirin 81 mg daily and Lipitor.  30-day event monitor was ordered, which showed episodes of Afib.  She was referred to AF clinic, started on Eliquis 5mg  BID.  TTE on 11/23/2019 showed normal LV systolic function; grade 1 diastolic dysfunction; severe LVH (mild speckled appearance; suggest cardiac MRI to R/O HCM or amyloid); mildly dilated ascending aorta; moderate LAE.  Since last clinic visit****  Since discharge from the hospital, she has been weaned off clonidine and now taking valsartan/hydrochlorothiazide 160/25 mg daily, in addition to amlodipine 5 mg daily.  She brought her blood pressure log in with her, has been controlled (110s to 130s/70s to 90s).  Denies any chest pain, dyspnea, lightheadedness, syncope, palpitations.   Past Medical History:  Diagnosis Date  . Cardiomegaly   . Glaucoma   . Hypertension     Past Surgical History:  Procedure Laterality Date  . no past surgery      Current Medications: No outpatient medications have been marked as taking for the 01/21/20 encounter  (Appointment) with Donato Heinz, MD.     Allergies:   Penicillins   Social History   Socioeconomic History  . Marital status: Married    Spouse name: Not on file  . Number of children: Not on file  . Years of education: Not on file  . Highest education level: Not on file  Occupational History  . Not on file  Tobacco Use  . Smoking status: Former Smoker    Packs/day: 0.25    Years: 30.00    Pack years: 7.50    Types: Cigarettes  . Smokeless tobacco: Never Used  Substance and Sexual Activity  . Alcohol use: Yes    Alcohol/week: 1.0 - 2.0 standard drinks    Types: 1 - 2 Glasses of wine per week  . Drug use: No  . Sexual activity: Not on file  Other Topics Concern  . Not on file  Social History Narrative  . Not on file   Social Determinants of Health   Financial Resource Strain:   . Difficulty of Paying Living Expenses: Not on file  Food Insecurity:   . Worried About Charity fundraiser in the Last Year: Not on file  . Ran Out of Food in the Last Year: Not on file  Transportation Needs:   . Lack of Transportation (Medical): Not on file  . Lack of Transportation (Non-Medical): Not on file  Physical Activity:   . Days of Exercise per Week: Not on file  . Minutes of Exercise per Session: Not on file  Stress:   .  Feeling of Stress : Not on file  Social Connections:   . Frequency of Communication with Friends and Family: Not on file  . Frequency of Social Gatherings with Friends and Family: Not on file  . Attends Religious Services: Not on file  . Active Member of Clubs or Organizations: Not on file  . Attends Banker Meetings: Not on file  . Marital Status: Not on file     Family History: The patient's family history includes Allergic rhinitis in her son. There is no history of Angioedema, Asthma, Eczema, Immunodeficiency, or Urticaria.  ROS:   Please see the history of present illness.     All other systems reviewed and are  negative.  EKGs/Labs/Other Studies Reviewed:    The following studies were reviewed today:   EKG:  EKG is ordered today.  The ekg ordered today demonstrates sinus rhythm, rate 58, Q waves in V1/2, left atrial enlargement  Recent Labs: 11/30/2019: ALT 26; B Natriuretic Peptide 406.5 12/01/2019: BUN 15; Creatinine, Ser 0.77; Hemoglobin 13.5; Platelets 125; Potassium 4.1; Sodium 141  Recent Lipid Panel    Component Value Date/Time   CHOL 232 (H) 11/23/2019 1252   TRIG 55 11/23/2019 1252   HDL 77 11/23/2019 1252   CHOLHDL 3.0 11/23/2019 1252   VLDL 11 11/23/2019 1252   LDLCALC 144 (H) 11/23/2019 1252   TTE 11/23/19: 1. Left ventricular ejection fraction, by visual estimation, is 60 to 65%. The left ventricle has normal function. There is severely increased left ventricular hypertrophy. 2. Elevated left atrial pressure. 3. Left ventricular diastolic parameters are consistent with Grade I diastolic dysfunction (impaired relaxation). 4. Global right ventricle has normal systolic function.The right ventricular size is normal. 5. Left atrial size was moderately dilated. 6. Right atrial size was normal. 7. Trivial pericardial effusion is present. 8. The mitral valve is normal in structure. Mild mitral valve regurgitation. No evidence of mitral stenosis. 9. The tricuspid valve is normal in structure. 10. The aortic valve is tricuspid. Aortic valve regurgitation is not visualized. No evidence of aortic valve sclerosis or stenosis. 11. The pulmonic valve was normal in structure. Pulmonic valve regurgitation is not visualized. 12. Aortic dilatation noted. 13. There is mild dilatation of the ascending aorta measuring 38 mm. 14. The inferior vena cava is normal in size with greater than 50% respiratory variability, suggesting right atrial pressure of 3 mmHg. 15. Normal LV systolic function; grade 1 diastolic dysfunction; severe LVH (mild speckled appearance; suggest cardiac MRI to R/O HCM  or amyloid); mildly dilated ascending aorta; moderate LAE. 16. The left ventricle has no regional wall motion abnormalities.  Physical Exam:    VS:  There were no vitals taken for this visit.    Wt Readings from Last 3 Encounters:  01/08/20 129 lb 6.4 oz (58.7 kg)  12/30/19 129 lb 3.2 oz (58.6 kg)  12/01/19 128 lb 1.4 oz (58.1 kg)     GEN:  in no acute distress HEENT: Normal NECK: No JVD LYMPHATICS: No lymphadenopathy CARDIAC: RRR, no murmurs, rubs, gallops RESPIRATORY:  Clear to auscultation without rales, wheezing or rhonchi  ABDOMEN: Soft, non-tender, non-distended MUSCULOSKELETAL:  No edema; No deformity  SKIN: Warm and dry NEUROLOGIC:  Alert and oriented x 3 PSYCHIATRIC:  Normal affect   ASSESSMENT:    No diagnosis found. PLAN:    Atrial fibrillation: Paroxysmal, noted on 30-day event monitor following TIA.  Seen in AF clinic on 01/08/2020, started on Eliquis 5 mg twice daily.  CHA2DS2-VASc score of 6.  No rate controlling agents were started given bradycardia while in sinus rhythm.  Hypertension: On valsartan/hydrochlorothiazide 160/25 mg daily, in addition to amlodipine 5 mg daily.  Appears controlled.   LVH: Severe on TTE.  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 Suspected small aneurysm of the distal supraclinoid left ICA.  30-day event monitor shows episodes of atrial fibrillation  Aortic dilatation: Mild dilatation of the ascending aorta on TTE, measuring 38 mm.  Will evaluate further with MRA aorta when doing cardiac MRI as above  Hyperlipidemia: LDL 144 on 11/23/2019.  Started on atorvastatin 40 mg daily  RTC in**  Medication Adjustments/Labs and Tests Ordered: Current medicines are reviewed at length with the patient today.  Concerns regarding medicines are outlined above.  No orders of the defined types were placed in this encounter.  No  orders of the defined types were placed in this encounter.   There are no Patient Instructions on file for this visit.   Signed, Little Ishikawa, MD  01/20/2020 10:44 PM    [facility]

## 2020-01-21 ENCOUNTER — Ambulatory Visit: Payer: Medicare HMO | Admitting: Cardiology

## 2020-01-23 ENCOUNTER — Ambulatory Visit: Payer: Medicare HMO | Admitting: Cardiology

## 2020-01-23 ENCOUNTER — Ambulatory Visit (HOSPITAL_COMMUNITY): Payer: Medicare HMO | Admitting: Nurse Practitioner

## 2020-01-23 NOTE — Progress Notes (Deleted)
Cardiology Office Note:    Date:  01/23/2020   ID:  Brooke Gray, DOB 1945/12/09, MRN [id number]  PCP:  Jackie Plum, MD  Cardiologist:  No primary care provider on file.  Electrophysiologist:  None   Referring MD: Jackie Plum, MD   No chief complaint on file.   History of Present Illness:    Brooke Gray is a 74 y.o. female with a hx of TIA (11/2019), hypertension, tobacco use, hyperlipidemia, ascending aortic aneurysm, HFpEF who presents for follow-up.  She was referred by Dr. Julio Sicks for evaluation of hypertension on 12/30/19.  She was admitted to [facility] on 11/30/2019 with shortness of breath, BP up to 181/100.  No chest pain.  High-sensitivity troponin peaked at 255, thought to be demand ischemia due to uncontrolled hypertension.  She also had a recent admit with TIA, was hospitalized from 1/8-1/10/21.  She was discharged on DAPT x3 weeks with plans for been continuing aspirin 81 mg daily and Lipitor.  30-day event monitor was ordered, which showed episodes of Afib.  She was referred to AF clinic, started on Eliquis 5mg  BID.  TTE on 11/23/2019 showed normal LV systolic function; grade 1 diastolic dysfunction; severe LVH (mild speckled appearance; suggest cardiac MRI to R/O HCM or amyloid); mildly dilated ascending aorta; moderate LAE.  Since last clinic visit****  Since discharge from the hospital, she has been weaned off clonidine and now taking valsartan/hydrochlorothiazide 160/25 mg daily, in addition to amlodipine 5 mg daily.  She brought her blood pressure log in with her, has been controlled (110s to 130s/70s to 90s).  Denies any chest pain, dyspnea, lightheadedness, syncope, palpitations.  Past Medical History:  Diagnosis Date  . Cardiomegaly   . Glaucoma   . Hypertension     Past Surgical History:  Procedure Laterality Date  . no past surgery      Current Medications: No outpatient medications have been marked as taking for the 01/23/20 encounter  (Appointment) with 03/24/20, MD.     Allergies:   Penicillins   Social History   Socioeconomic History  . Marital status: Married    Spouse name: Not on file  . Number of children: Not on file  . Years of education: Not on file  . Highest education level: Not on file  Occupational History  . Not on file  Tobacco Use  . Smoking status: Former Smoker    Packs/day: 0.25    Years: 30.00    Pack years: 7.50    Types: Cigarettes  . Smokeless tobacco: Never Used  Substance and Sexual Activity  . Alcohol use: Yes    Alcohol/week: 1.0 - 2.0 standard drinks    Types: 1 - 2 Glasses of wine per week  . Drug use: No  . Sexual activity: Not on file  Other Topics Concern  . Not on file  Social History Narrative  . Not on file   Social Determinants of Health   Financial Resource Strain:   . Difficulty of Paying Living Expenses:   Food Insecurity:   . Worried About [place] in the Last Year:   . Programme researcher, broadcasting/film/video in the Last Year:   Transportation Needs:   . Barista (Medical):   Freight forwarder Lack of Transportation (Non-Medical):   Physical Activity:   . Days of Exercise per Week:   . Minutes of Exercise per Session:   Stress:   . Feeling of Stress :   Social Connections:   . Frequency  of Communication with Friends and Family:   . Frequency of Social Gatherings with Friends and Family:   . Attends Religious Services:   . Active Member of Clubs or Organizations:   . Attends Banker Meetings:   Marland Kitchen Marital Status:      Family History: The patient's family history includes Allergic rhinitis in her son. There is no history of Angioedema, Asthma, Eczema, Immunodeficiency, or Urticaria.  ROS:   Please see the history of present illness.     All other systems reviewed and are negative.  EKGs/Labs/Other Studies Reviewed:    The following studies were reviewed today:   EKG:  EKG is ordered today.  The ekg ordered today demonstrates  sinus rhythm, rate 58, Q waves in V1/2, left atrial enlargement  Recent Labs: 11/30/2019: ALT 26; B Natriuretic Peptide 406.5 12/01/2019: BUN 15; Creatinine, Ser 0.77; Hemoglobin 13.5; Platelets 125; Potassium 4.1; Sodium 141  Recent Lipid Panel    Component Value Date/Time   CHOL 232 (H) 11/23/2019 1252   TRIG 55 11/23/2019 1252   HDL 77 11/23/2019 1252   CHOLHDL 3.0 11/23/2019 1252   VLDL 11 11/23/2019 1252   LDLCALC 144 (H) 11/23/2019 1252   TTE 11/23/19: 1. Left ventricular ejection fraction, by visual estimation, is 60 to 65%. The left ventricle has normal function. There is severely increased left ventricular hypertrophy. 2. Elevated left atrial pressure. 3. Left ventricular diastolic parameters are consistent with Grade I diastolic dysfunction (impaired relaxation). 4. Global right ventricle has normal systolic function.The right ventricular size is normal. 5. Left atrial size was moderately dilated. 6. Right atrial size was normal. 7. Trivial pericardial effusion is present. 8. The mitral valve is normal in structure. Mild mitral valve regurgitation. No evidence of mitral stenosis. 9. The tricuspid valve is normal in structure. 10. The aortic valve is tricuspid. Aortic valve regurgitation is not visualized. No evidence of aortic valve sclerosis or stenosis. 11. The pulmonic valve was normal in structure. Pulmonic valve regurgitation is not visualized. 12. Aortic dilatation noted. 13. There is mild dilatation of the ascending aorta measuring 38 mm. 14. The inferior vena cava is normal in size with greater than 50% respiratory variability, suggesting right atrial pressure of 3 mmHg. 15. Normal LV systolic function; grade 1 diastolic dysfunction; severe LVH (mild speckled appearance; suggest cardiac MRI to R/O HCM or amyloid); mildly dilated ascending aorta; moderate LAE. 16. The left ventricle has no regional wall motion abnormalities.  Physical Exam:    VS:  There were  no vitals taken for this visit.    Wt Readings from Last 3 Encounters:  01/08/20 129 lb 6.4 oz (58.7 kg)  12/30/19 129 lb 3.2 oz (58.6 kg)  12/01/19 128 lb 1.4 oz (58.1 kg)     GEN:  in no acute distress HEENT: Normal NECK: No JVD LYMPHATICS: No lymphadenopathy CARDIAC: RRR, no murmurs, rubs, gallops RESPIRATORY:  Clear to auscultation without rales, wheezing or rhonchi  ABDOMEN: Soft, non-tender, non-distended MUSCULOSKELETAL:  No edema; No deformity  SKIN: Warm and dry NEUROLOGIC:  Alert and oriented x 3 PSYCHIATRIC:  Normal affect   ASSESSMENT:    No diagnosis found. PLAN:    Atrial fibrillation: Paroxysmal, noted on 30-day event monitor following TIA.  Seen in AF clinic on 01/08/2020, started on Eliquis 5 mg twice daily.  CHA2DS2-VASc score of 6.  No rate controlling agents were started given bradycardia while in sinus rhythm.  Hypertension: On valsartan/hydrochlorothiazide 160/25 mg daily, in addition to amlodipine 5 mg  daily.  Appears controlled.   LVH: Severe on TTE.  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 Suspected small aneurysm of the distal supraclinoid left ICA.  30-day event monitor shows episodes of atrial fibrillation  Aortic dilatation: Mild dilatation of the ascending aorta on TTE, measuring 38 mm.  Will evaluate further with MRA aorta when doing cardiac MRI as above  Hyperlipidemia: LDL 144 on 11/23/2019.  Started on atorvastatin 40 mg daily  RTC in** Medication Adjustments/Labs and Tests Ordered: Current medicines are reviewed at length with the patient today.  Concerns regarding medicines are outlined above.  No orders of the defined types were placed in this encounter.  No orders of the defined types were placed in this encounter.   There are no Patient Instructions on file for this visit.   Signed, Donato Heinz, MD    01/23/2020 8:35 AM    [place]

## 2020-01-28 DIAGNOSIS — Z8673 Personal history of transient ischemic attack (TIA), and cerebral infarction without residual deficits: Secondary | ICD-10-CM | POA: Diagnosis not present

## 2020-01-28 DIAGNOSIS — I480 Paroxysmal atrial fibrillation: Secondary | ICD-10-CM | POA: Diagnosis not present

## 2020-01-28 DIAGNOSIS — I10 Essential (primary) hypertension: Secondary | ICD-10-CM | POA: Diagnosis not present

## 2020-01-28 DIAGNOSIS — E782 Mixed hyperlipidemia: Secondary | ICD-10-CM | POA: Diagnosis not present

## 2020-02-03 ENCOUNTER — Telehealth (HOSPITAL_COMMUNITY): Payer: Self-pay | Admitting: Emergency Medicine

## 2020-02-03 NOTE — Telephone Encounter (Signed)
Calling to review cMR instructions with patient prior to her appt tomorrow. Pt states she is cancelling this appt as she is working with another cardiologist who recommended postponing   Brooke Alexandria RN Navigator Cardiac Imaging [facility] [phone] Office  [phone] Cell

## 2020-02-04 ENCOUNTER — Ambulatory Visit (HOSPITAL_COMMUNITY): Admission: RE | Admit: 2020-02-04 | Payer: Medicare HMO | Source: Ambulatory Visit

## 2020-02-04 ENCOUNTER — Other Ambulatory Visit: Payer: Self-pay | Admitting: Physician Assistant

## 2020-02-04 DIAGNOSIS — I10 Essential (primary) hypertension: Secondary | ICD-10-CM | POA: Diagnosis not present

## 2020-02-04 DIAGNOSIS — I4891 Unspecified atrial fibrillation: Secondary | ICD-10-CM

## 2020-02-04 DIAGNOSIS — G459 Transient cerebral ischemic attack, unspecified: Secondary | ICD-10-CM

## 2020-02-04 DIAGNOSIS — I480 Paroxysmal atrial fibrillation: Secondary | ICD-10-CM | POA: Diagnosis not present

## 2020-02-04 DIAGNOSIS — E782 Mixed hyperlipidemia: Secondary | ICD-10-CM | POA: Diagnosis not present

## 2020-02-12 DIAGNOSIS — I10 Essential (primary) hypertension: Secondary | ICD-10-CM | POA: Diagnosis not present

## 2020-02-12 DIAGNOSIS — I480 Paroxysmal atrial fibrillation: Secondary | ICD-10-CM | POA: Diagnosis not present

## 2020-02-12 DIAGNOSIS — E782 Mixed hyperlipidemia: Secondary | ICD-10-CM | POA: Diagnosis not present

## 2020-02-18 ENCOUNTER — Other Ambulatory Visit: Payer: Self-pay | Admitting: Neurology

## 2020-02-18 DIAGNOSIS — I480 Paroxysmal atrial fibrillation: Secondary | ICD-10-CM

## 2020-02-18 NOTE — Progress Notes (Signed)
Thanks for the update

## 2020-02-27 DIAGNOSIS — R0989 Other specified symptoms and signs involving the circulatory and respiratory systems: Secondary | ICD-10-CM | POA: Diagnosis not present

## 2020-03-24 DIAGNOSIS — Z8673 Personal history of transient ischemic attack (TIA), and cerebral infarction without residual deficits: Secondary | ICD-10-CM | POA: Diagnosis not present

## 2020-03-24 DIAGNOSIS — I10 Essential (primary) hypertension: Secondary | ICD-10-CM | POA: Diagnosis not present

## 2020-03-24 DIAGNOSIS — E782 Mixed hyperlipidemia: Secondary | ICD-10-CM | POA: Diagnosis not present

## 2020-03-24 DIAGNOSIS — I480 Paroxysmal atrial fibrillation: Secondary | ICD-10-CM | POA: Diagnosis not present

## 2020-03-26 DIAGNOSIS — R6889 Other general symptoms and signs: Secondary | ICD-10-CM | POA: Diagnosis not present

## 2020-03-30 ENCOUNTER — Ambulatory Visit: Payer: Medicare HMO | Admitting: Cardiology

## 2020-04-09 ENCOUNTER — Emergency Department (HOSPITAL_BASED_OUTPATIENT_CLINIC_OR_DEPARTMENT_OTHER): Admission: EM | Admit: 2020-04-09 | Discharge: 2020-04-09 | Discharge status: Against Medical Advice | Payer: Self-pay

## 2020-04-09 NOTE — ED Notes (Addendum)
Pt decided to leave before being triaged, informed registration that she would be next to triage but she refused to stay.  Pt left at 1345 during downtime

## 2020-05-30 ENCOUNTER — Encounter (HOSPITAL_BASED_OUTPATIENT_CLINIC_OR_DEPARTMENT_OTHER): Payer: Self-pay

## 2020-05-30 ENCOUNTER — Other Ambulatory Visit: Payer: Self-pay

## 2020-05-30 ENCOUNTER — Emergency Department (HOSPITAL_BASED_OUTPATIENT_CLINIC_OR_DEPARTMENT_OTHER)
Admission: EM | Admit: 2020-05-30 | Discharge: 2020-05-31 | Disposition: A | Discharge status: Home / Self Care | Payer: Medicare HMO | Attending: Emergency Medicine | Admitting: Emergency Medicine

## 2020-05-30 DIAGNOSIS — I10 Essential (primary) hypertension: Secondary | ICD-10-CM | POA: Insufficient documentation

## 2020-05-30 DIAGNOSIS — F17210 Nicotine dependence, cigarettes, uncomplicated: Secondary | ICD-10-CM | POA: Insufficient documentation

## 2020-05-30 DIAGNOSIS — Z7901 Long term (current) use of anticoagulants: Secondary | ICD-10-CM | POA: Diagnosis not present

## 2020-05-30 DIAGNOSIS — Z79899 Other long term (current) drug therapy: Secondary | ICD-10-CM | POA: Insufficient documentation

## 2020-05-30 DIAGNOSIS — T50901A Poisoning by unspecified drugs, medicaments and biological substances, accidental (unintentional), initial encounter: Secondary | ICD-10-CM | POA: Diagnosis present

## 2020-05-30 DIAGNOSIS — T474X1A Poisoning by other laxatives, accidental (unintentional), initial encounter: Secondary | ICD-10-CM | POA: Diagnosis not present

## 2020-05-30 HISTORY — DX: Unspecified atrial fibrillation: I48.91

## 2020-05-30 LAB — URINALYSIS, MICROSCOPIC (REFLEX)

## 2020-05-30 LAB — URINALYSIS, ROUTINE W REFLEX MICROSCOPIC
Bilirubin Urine: NEGATIVE
Glucose, UA: NEGATIVE mg/dL
Ketones, ur: NEGATIVE mg/dL
Leukocytes,Ua: NEGATIVE
Nitrite: NEGATIVE
Protein, ur: NEGATIVE mg/dL
Specific Gravity, Urine: <1.005 — ABNORMAL LOW (ref 1.005–1.030)
pH: 6 (ref 5.0–8.0)

## 2020-05-30 LAB — BASIC METABOLIC PANEL
Anion gap: 11 (ref 5–15)
BUN: 14 mg/dL (ref 8–23)
CO2: 27 mmol/L (ref 22–32)
Calcium: 9 mg/dL (ref 8.9–10.3)
Chloride: 99 mmol/L (ref 98–111)
Creatinine, Ser: 0.87 mg/dL (ref 0.44–1.00)
GFR calc Af Amer: >60 mL/min (ref >60)
GFR calc non Af Amer: >60 mL/min (ref >60)
Glucose, Bld: 102 mg/dL — ABNORMAL HIGH (ref 70–99)
Potassium: 3.6 mmol/L (ref 3.5–5.1)
Sodium: 137 mmol/L (ref 135–145)

## 2020-05-30 LAB — CBC
HCT: 39.8 % (ref 36.0–46.0)
Hemoglobin: 14.2 g/dL (ref 12.0–15.0)
MCH: 31.6 pg (ref 26.0–34.0)
MCHC: 35.7 g/dL (ref 30.0–36.0)
MCV: 88.6 fL (ref 80.0–100.0)
Platelets: 132 10*3/uL — ABNORMAL LOW (ref 150–400)
RBC: 4.49 MIL/uL (ref 3.87–5.11)
RDW: 12.2 % (ref 11.5–15.5)
WBC: 4.9 10*3/uL (ref 4.0–10.5)
nRBC: 0 % (ref 0.0–0.2)

## 2020-05-30 NOTE — ED Provider Notes (Signed)
[facility] Provider Note: Lowella Dell, MD, FACEP  CSN: [id number] MRN: [id number] ARRIVAL: 05/30/20 at 2120 ROOM: MH11/MH11   CHIEF COMPLAINT  Drug Overdose (Unintentional)   HISTORY OF PRESENT ILLNESS  05/30/20 11:49 PM Brooke Gray is a 74 y.o. female who took a dose of laxative ("something like Dulcolax") 2 days ago and then took a second dose forgetting she had already taken one.  Later that day she had profuse watery stools which lasted about 4 hours.  She continued to have loose stools this morning.  She was also very sleepy throughout most of the day.  She felt weak and lightheaded with nausea and is concerned she may have gotten dehydrated.  She has been drinking copious fluids and now states she feels back to normal.  She denies abdominal pain.   Past Medical History:  Diagnosis Date  . A-fib (HCC)   . Cardiomegaly   . Glaucoma   . Hypertension     Past Surgical History:  Procedure Laterality Date  . no past surgery      Family History  Problem Relation Age of Onset  . Allergic rhinitis Son   . Angioedema Neg Hx   . Asthma Neg Hx   . Eczema Neg Hx   . Immunodeficiency Neg Hx   . Urticaria Neg Hx     Social History   Tobacco Use  . Smoking status: Current Some Day Smoker    Packs/day: 0.25    Years: 30.00    Pack years: 7.50    Types: Cigarettes  . Smokeless tobacco: Never Used  Vaping Use  . Vaping Use: Never used  Substance Use Topics  . Alcohol use: Yes    Alcohol/week: 1.0 - 2.0 standard drink    Types: 1 - 2 Glasses of wine per week  . Drug use: No    Prior to Admission medications   Medication Sig Start Date End Date Taking? Authorizing Provider  acetaminophen (TYLENOL) 325 MG tablet Take 2 tablets (650 mg total) by mouth every 4 (four) hours as needed for mild pain (or temp > 37.5 C (99.5 F)). 11/24/19   Calvert Cantor, MD  amLODipine (NORVASC) 5 MG tablet Take 1 tablet (5 mg total) by mouth daily. 01/09/20 01/08/21  Newman Nip, NP  apixaban (ELIQUIS) 5 MG TABS tablet Take 1 tablet (5 mg total) by mouth 2 (two) times daily. 01/08/20   Newman Nip, NP  Ascorbic Acid (VITAMIN C) 1000 MG tablet Take 1,000 mg by mouth daily.    [provider]  atorvastatin (LIPITOR) 40 MG tablet Take 1 tablet (40 mg total) by mouth daily at 6 PM. 11/24/19   Calvert Cantor, MD  Celery Seed OIL by Does not apply route.    [provider]  Cholecalciferol (VITAMIN D3 PO) Take 5,000 mcg by mouth daily.     [provider]  Cyanocobalamin (VITAMIN B-12 PO) Take 1 tablet by mouth 3 (three) times a week.     [provider]  latanoprost (XALATAN) 0.005 % ophthalmic solution Place 1 drop into both eyes at bedtime.  03/30/19   [provider]  Magnesium 400 MG CAPS Take 400 mg by mouth daily.     [provider]  valsartan-hydrochlorothiazide (DIOVAN-HCT) 160-25 MG tablet Take 1 tablet by mouth daily.    [provider]  zinc sulfate (ZINC-220) 220 (50 Zn) MG capsule Take 220 mg by mouth daily.    [provider]  Allergies Penicillins   REVIEW OF SYSTEMS  Negative except as noted here or in the History of Present Illness.   PHYSICAL EXAMINATION  Initial Vital Signs Blood pressure 126/70, pulse 65, temperature 99.9 F (37.7 C), temperature source Oral, resp. rate 18, height 5\' 3"  (1.6 m), weight 59 kg, SpO2 98 %.  Examination General: Well-developed, well-nourished female in no acute distress; appearance consistent with age of record HENT: normocephalic; atraumatic Eyes: pupils equal, round and reactive to light; extraocular muscles intact Neck: supple Heart: regular rate and rhythm Lungs: clear to auscultation bilaterally Abdomen: soft; nondistended; nontender; no masses or hepatosplenomegaly; bowel sounds present Extremities: No deformity; full range of motion; pulses normal Neurologic: Awake, alert and oriented; motor function intact in all  extremities and symmetric; no facial droop Skin: Warm and dry Psychiatric: Normal mood and affect   RESULTS  Summary of this visit's results, reviewed and interpreted by myself:   EKG Interpretation  Date/Time:    Ventricular Rate:    PR Interval:    QRS Duration:   QT Interval:    QTC Calculation:   R Axis:     Text Interpretation:        Laboratory Studies: Results for orders placed or performed during the hospital encounter of 05/30/20 (from the past 24 hour(s))  CBC     Status: Abnormal   Collection Time: 05/30/20 10:11 PM  Result Value Ref Range   WBC 4.9 4.0 - 10.5 K/uL   RBC 4.49 3.87 - 5.11 MIL/uL   Hemoglobin 14.2 12.0 - 15.0 g/dL   HCT 06/01/20 36 - 46 %   MCV 88.6 80.0 - 100.0 fL   MCH 31.6 26.0 - 34.0 pg   MCHC 35.7 30.0 - 36.0 g/dL   RDW 40.9 81.1 - 91.4 %   Platelets 132 (L) 150 - 400 K/uL   nRBC 0.0 0.0 - 0.2 %  Basic metabolic panel     Status: Abnormal   Collection Time: 05/30/20 10:11 PM  Result Value Ref Range   Sodium 137 135 - 145 mmol/L   Potassium 3.6 3.5 - 5.1 mmol/L   Chloride 99 98 - 111 mmol/L   CO2 27 22 - 32 mmol/L   Glucose, Bld 102 (H) 70 - 99 mg/dL   BUN 14 8 - 23 mg/dL   Creatinine, Ser 06/01/20 0.44 - 1.00 mg/dL   Calcium 9.0 8.9 - 9.56 mg/dL   GFR calc non Af Amer >60 >60 mL/min   GFR calc Af Amer >60 >60 mL/min   Anion gap 11 5 - 15  Urinalysis, Routine w reflex microscopic     Status: Abnormal   Collection Time: 05/30/20 11:20 PM  Result Value Ref Range   Color, Urine YELLOW YELLOW   APPearance HAZY (A) CLEAR   Specific Gravity, Urine <1.005 (L) 1.005 - 1.030   pH 6.0 5.0 - 8.0   Glucose, UA NEGATIVE NEGATIVE mg/dL   Hgb urine dipstick TRACE (A) NEGATIVE   Bilirubin Urine NEGATIVE NEGATIVE   Ketones, ur NEGATIVE NEGATIVE mg/dL   Protein, ur NEGATIVE NEGATIVE mg/dL   Nitrite NEGATIVE NEGATIVE   Leukocytes,Ua NEGATIVE NEGATIVE  Urinalysis, Microscopic (reflex)     Status: Abnormal   Collection Time: 05/30/20 11:20 PM  Result  Value Ref Range   RBC / HPF 0-5 0 - 5 RBC/hpf   WBC, UA 0-5 0 - 5 WBC/hpf   Bacteria, UA FEW (A) NONE SEEN   Squamous Epithelial / LPF 6-10 0 - 5  Imaging Studies: No results found.  ED COURSE and MDM  Nursing notes, initial and subsequent vitals signs, including pulse oximetry, reviewed and interpreted by myself.  Vitals:   05/30/20 2134 05/30/20 2135 05/30/20 2346  BP: 114/70  126/70  Pulse: 79  65  Resp: 20  18  Temp: 99.9 F (37.7 C)    TempSrc: Oral    SpO2: 98%  98%  Weight:  59 kg   Height:  5\' 3"  (1.6 m)    Medications - No data to display  The patient appears well-hydrated and is feeling back to her baseline.  I do not believe IV fluids are required.  PROCEDURES  Procedures   ED DIAGNOSES     ICD-10-CM   1. Laxative poisoning, accidental or unintentional, initial encounter  T47.4X1A        Vaibhav Fogleman, , MD 05/31/20 0002

## 2020-05-30 NOTE — ED Triage Notes (Signed)
Pt states she took too much of a laxative and has had 6 BM in last 24 hours. Pt thinks she is dehydrated. Pt reports she has been lightheaded, nausea, decreased appetite, and sleeping all day.

## 2020-06-17 IMAGING — MR MR HEAD W/O CM
8 of 10 series · 31 of 48 positions shown · non-contrast
Comparison: Head CT 11/22/2019

CLINICAL DATA: TIA. Transient speech/language disturbance.
Headache. Hypertension.

EXAM:
MRI HEAD WITHOUT CONTRAST
TECHNIQUE: Multiplanar, multiecho pulse sequences of the brain and surrounding
structures were obtained without intravenous contrast.

[Series 2: DWI · axial · 3.0mm · 0.94mm/px · z∈[-68,+84]mm · 9 of 104 slices shown (1 of 2)]
[im 1/104]
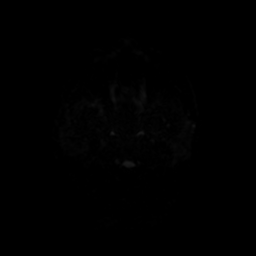
[im 13/104]
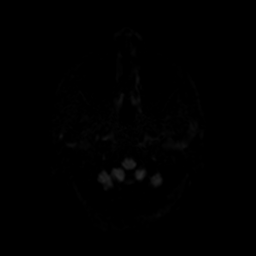
[im 26/104]
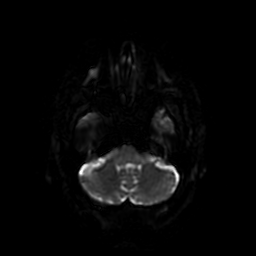
[im 39/104]
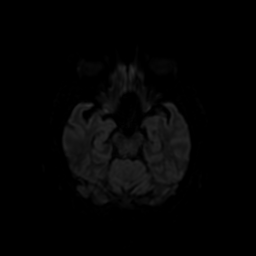
[im 52/104]
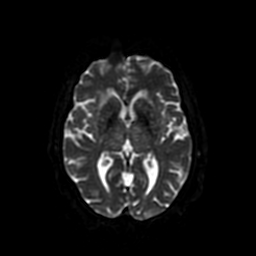
[im 65/104]
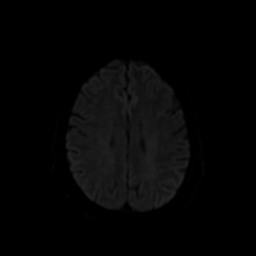
[im 78/104]
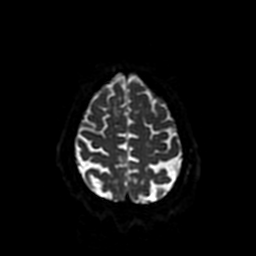
[im 91/104]
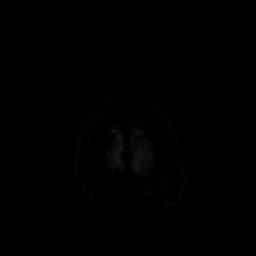
[im 104/104]
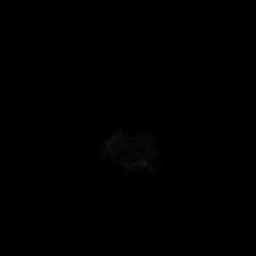

[Series 3: DWI · coronal · 4.0mm · 0.94mm/px · 6 of 72 slices shown (2 of 2)]
[im 1/72]
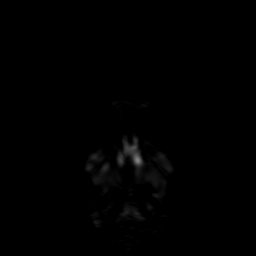
[im 15/72]
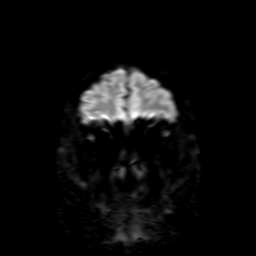
[im 29/72]
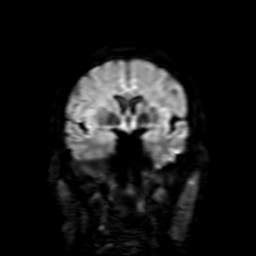
[im 43/72]
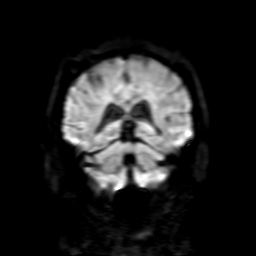
[im 57/72]
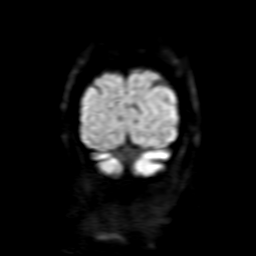
[im 72/72]
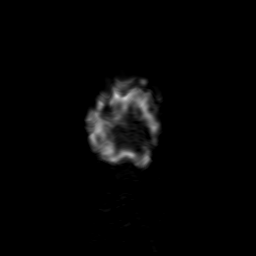

[Series 4: FLAIR · sagittal · 5.0mm · 0.47mm/px · 2 of 25 slices shown (1 of 2)]
[im 1/25]
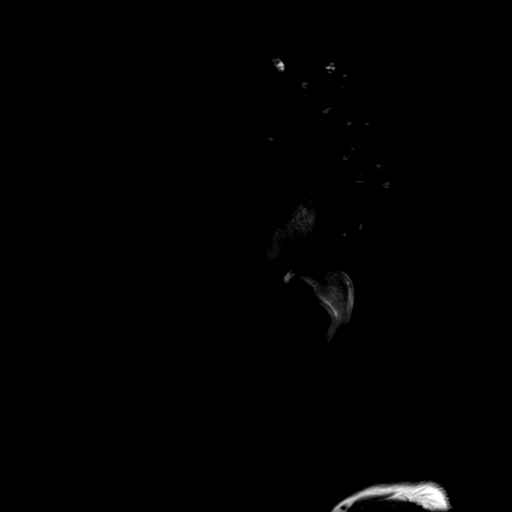
[im 25/25]
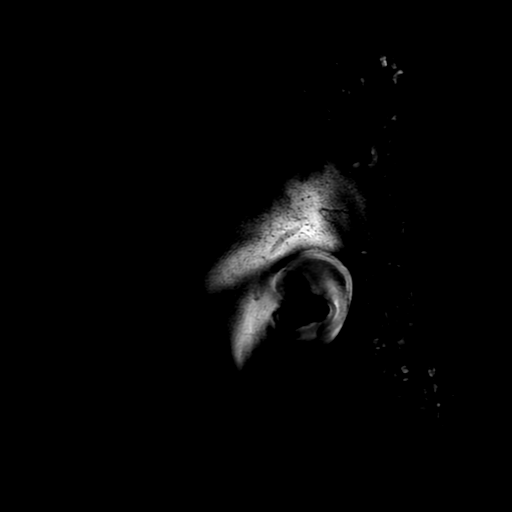

[Series 5: T2 · axial · 4.0mm · 0.47mm/px · z∈[-66,+74]mm · 3 of 33 slices shown (1 of 2)]
[im 1/33]
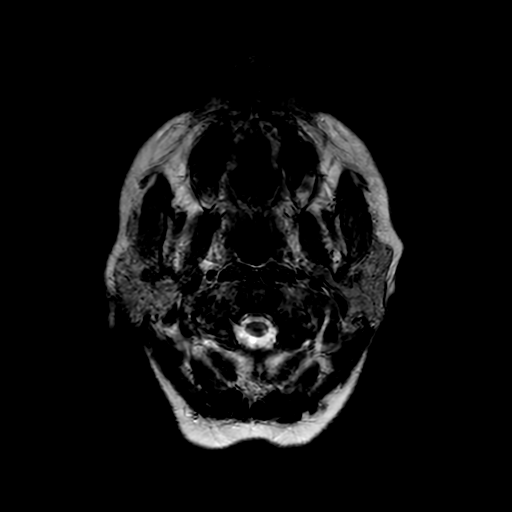
[im 17/33]
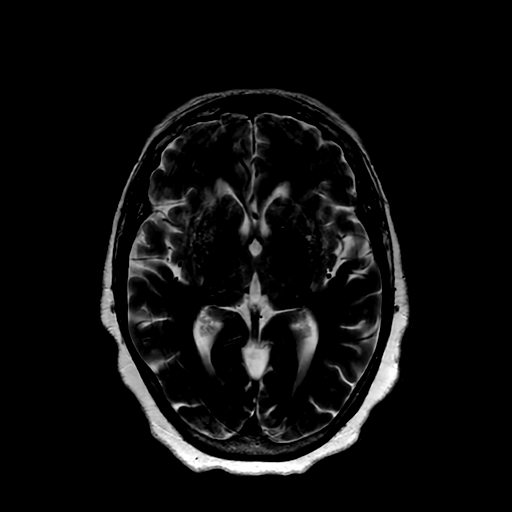
[im 33/33]
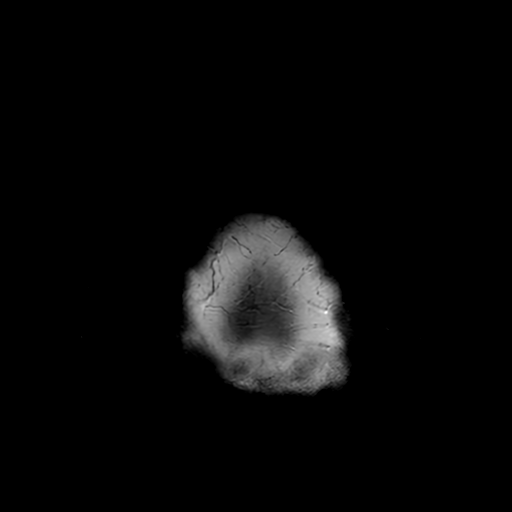

[Series 7: SWI · axial · 3.0mm · 0.47mm/px · z∈[-80,-38]mm · 3 of 100 slices shown]
[im 1/100]
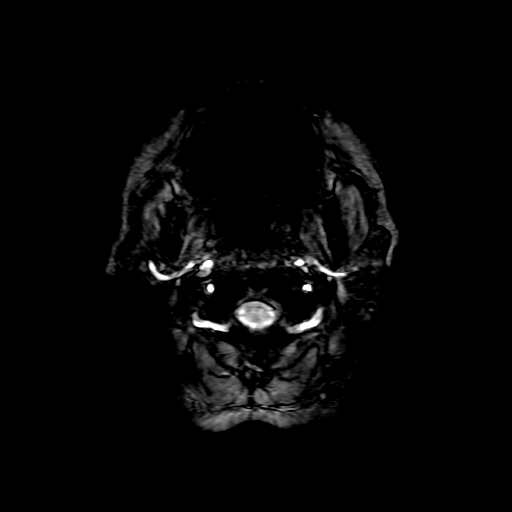
[im 15/100]
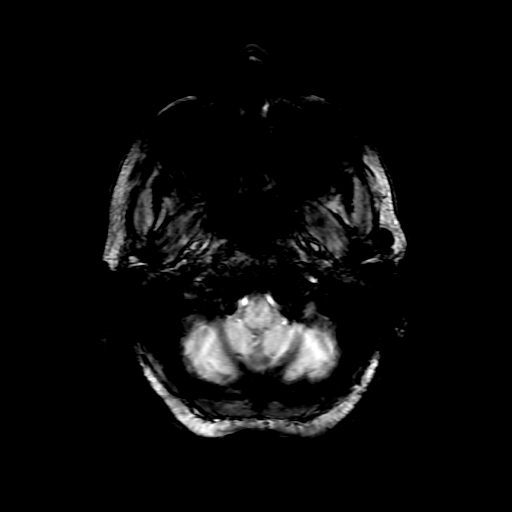
[im 29/100]
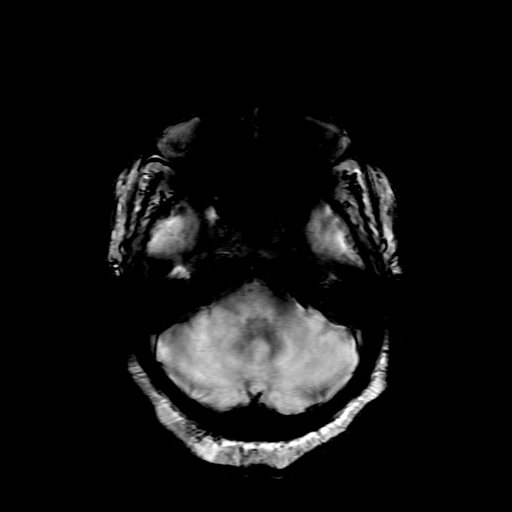

[Series 9: T2 · coronal · 5.0mm · 0.43mm/px · 3 of 30 slices shown (2 of 2)]
[im 1/30]
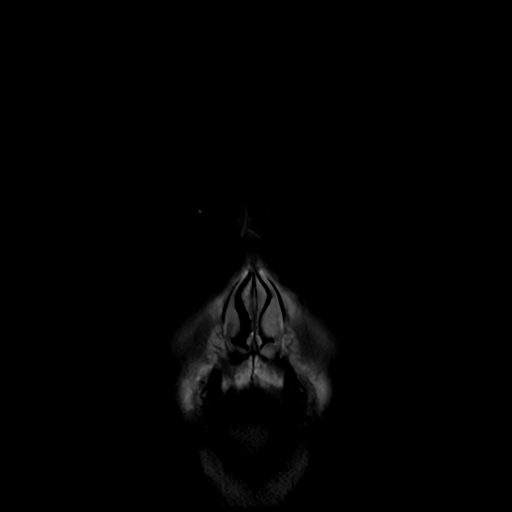
[im 15/30]
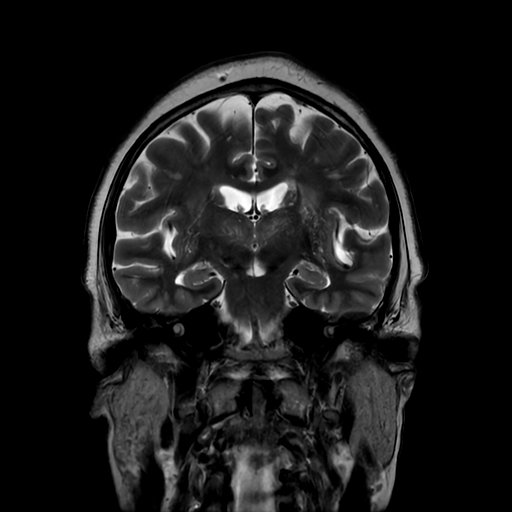
[im 30/30]
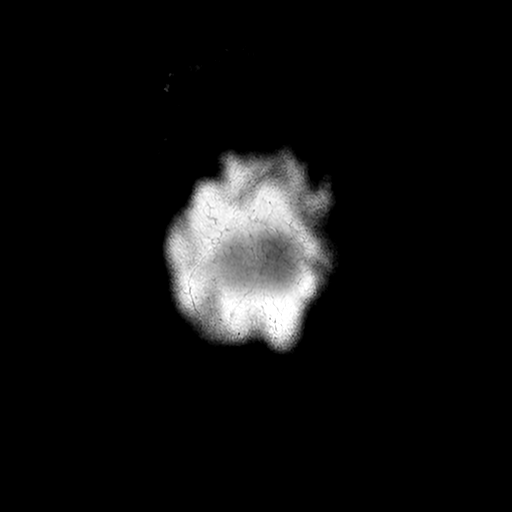

[Series 10: FLAIR · axial · 3.0mm · 0.47mm/px · z∈[-75,+62]mm · 2 of 24 slices shown (2 of 2)]
[im 1/24]
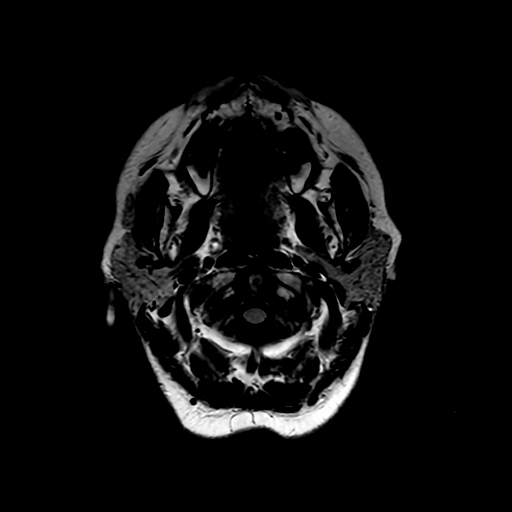
[im 24/24]
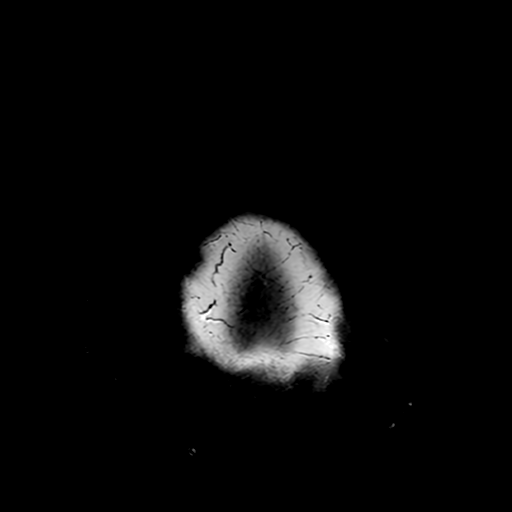

[Series 350: ADC · coronal · 4.0mm · 0.94mm/px · 3 of 36 slices shown]
[im 1/36]
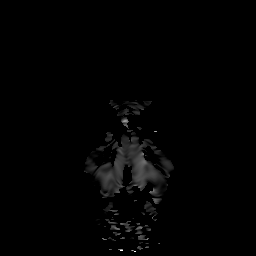
[im 18/36]
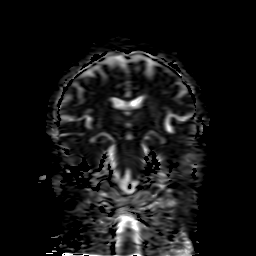
[im 36/36]
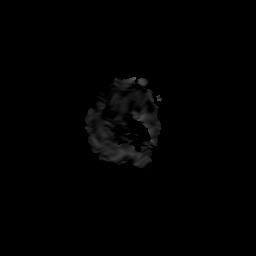

[31 of 48 positions shown; findings below may reference images not displayed]

FINDINGS: Brain: There is no evidence of acute infarct, mass, midline shift,
or extra-axial fluid collection. Chronic microhemorrhages are noted
in the left basal ganglia and pons, likely related to chronic
hypertension. Patchy to confluent T2 hyperintensities in the
cerebral white matter bilaterally are nonspecific but compatible
with moderately advanced chronic small vessel ischemic disease.
There are small chronic infarcts in the pons. T2 hyperintensities
throughout the basal ganglia and thalami likely reflect a
combination of dilated perivascular spaces and chronic small vessel
ischemia. Mild cerebral atrophy is within normal limits for age.

Vascular: Major intracranial vascular flow voids are preserved.

Skull and upper cervical spine: Unremarkable bone marrow signal.

Sinuses/Orbits: Unremarkable orbits. Paranasal sinuses and mastoid
air cells are clear.

Other: None.
IMPRESSION: 1. No acute intracranial abnormality.
2. Moderately advanced chronic small vessel ischemic disease.

## 2020-07-29 DIAGNOSIS — H401121 Primary open-angle glaucoma, left eye, mild stage: Secondary | ICD-10-CM | POA: Diagnosis not present

## 2020-07-29 DIAGNOSIS — H401113 Primary open-angle glaucoma, right eye, severe stage: Secondary | ICD-10-CM | POA: Diagnosis not present

## 2020-08-24 DIAGNOSIS — H401121 Primary open-angle glaucoma, left eye, mild stage: Secondary | ICD-10-CM | POA: Diagnosis not present

## 2020-08-24 DIAGNOSIS — H401113 Primary open-angle glaucoma, right eye, severe stage: Secondary | ICD-10-CM | POA: Diagnosis not present

## 2020-09-02 DIAGNOSIS — Z0000 Encounter for general adult medical examination without abnormal findings: Secondary | ICD-10-CM | POA: Diagnosis not present

## 2020-09-02 DIAGNOSIS — I480 Paroxysmal atrial fibrillation: Secondary | ICD-10-CM | POA: Diagnosis not present

## 2020-09-02 DIAGNOSIS — I10 Essential (primary) hypertension: Secondary | ICD-10-CM | POA: Diagnosis not present

## 2020-09-02 DIAGNOSIS — Z1231 Encounter for screening mammogram for malignant neoplasm of breast: Secondary | ICD-10-CM | POA: Diagnosis not present

## 2020-09-02 DIAGNOSIS — M899 Disorder of bone, unspecified: Secondary | ICD-10-CM | POA: Diagnosis not present

## 2020-09-02 DIAGNOSIS — Z8673 Personal history of transient ischemic attack (TIA), and cerebral infarction without residual deficits: Secondary | ICD-10-CM | POA: Diagnosis not present

## 2020-09-02 DIAGNOSIS — Z1211 Encounter for screening for malignant neoplasm of colon: Secondary | ICD-10-CM | POA: Diagnosis not present

## 2020-09-02 DIAGNOSIS — E782 Mixed hyperlipidemia: Secondary | ICD-10-CM | POA: Diagnosis not present

## 2020-09-02 DIAGNOSIS — Z780 Asymptomatic menopausal state: Secondary | ICD-10-CM | POA: Diagnosis not present

## 2020-09-07 DIAGNOSIS — H401121 Primary open-angle glaucoma, left eye, mild stage: Secondary | ICD-10-CM | POA: Diagnosis not present

## 2020-09-07 DIAGNOSIS — H401113 Primary open-angle glaucoma, right eye, severe stage: Secondary | ICD-10-CM | POA: Diagnosis not present

## 2020-09-07 DIAGNOSIS — H353131 Nonexudative age-related macular degeneration, bilateral, early dry stage: Secondary | ICD-10-CM | POA: Diagnosis not present

## 2020-09-09 DIAGNOSIS — H5213 Myopia, bilateral: Secondary | ICD-10-CM | POA: Diagnosis not present

## 2020-09-09 DIAGNOSIS — H52223 Regular astigmatism, bilateral: Secondary | ICD-10-CM | POA: Diagnosis not present

## 2020-09-16 DIAGNOSIS — R0981 Nasal congestion: Secondary | ICD-10-CM | POA: Diagnosis not present

## 2020-09-16 DIAGNOSIS — Z20822 Contact with and (suspected) exposure to covid-19: Secondary | ICD-10-CM | POA: Diagnosis not present

## 2020-09-16 DIAGNOSIS — R0982 Postnasal drip: Secondary | ICD-10-CM | POA: Diagnosis not present

## 2020-09-16 DIAGNOSIS — R0989 Other specified symptoms and signs involving the circulatory and respiratory systems: Secondary | ICD-10-CM | POA: Diagnosis not present

## 2020-09-16 DIAGNOSIS — R059 Cough, unspecified: Secondary | ICD-10-CM | POA: Diagnosis not present

## 2020-10-09 DIAGNOSIS — Z780 Asymptomatic menopausal state: Secondary | ICD-10-CM | POA: Diagnosis not present

## 2020-10-09 DIAGNOSIS — Z1231 Encounter for screening mammogram for malignant neoplasm of breast: Secondary | ICD-10-CM | POA: Diagnosis not present

## 2020-10-09 DIAGNOSIS — M8588 Other specified disorders of bone density and structure, other site: Secondary | ICD-10-CM | POA: Diagnosis not present

## 2020-10-09 DIAGNOSIS — M899 Disorder of bone, unspecified: Secondary | ICD-10-CM | POA: Diagnosis not present

## 2020-10-28 DIAGNOSIS — S0501XA Injury of conjunctiva and corneal abrasion without foreign body, right eye, initial encounter: Secondary | ICD-10-CM | POA: Diagnosis not present

## 2020-10-29 DIAGNOSIS — S0502XD Injury of conjunctiva and corneal abrasion without foreign body, left eye, subsequent encounter: Secondary | ICD-10-CM | POA: Diagnosis not present

## 2020-10-30 DIAGNOSIS — S0502XD Injury of conjunctiva and corneal abrasion without foreign body, left eye, subsequent encounter: Secondary | ICD-10-CM | POA: Diagnosis not present

## 2020-11-20 DIAGNOSIS — R058 Other specified cough: Secondary | ICD-10-CM | POA: Diagnosis not present

## 2021-03-10 DIAGNOSIS — I10 Essential (primary) hypertension: Secondary | ICD-10-CM | POA: Diagnosis not present

## 2021-03-10 DIAGNOSIS — E782 Mixed hyperlipidemia: Secondary | ICD-10-CM | POA: Diagnosis not present

## 2021-03-10 DIAGNOSIS — I480 Paroxysmal atrial fibrillation: Secondary | ICD-10-CM | POA: Diagnosis not present

## 2021-03-10 DIAGNOSIS — Z8673 Personal history of transient ischemic attack (TIA), and cerebral infarction without residual deficits: Secondary | ICD-10-CM | POA: Diagnosis not present

## 2021-03-15 DIAGNOSIS — Z9189 Other specified personal risk factors, not elsewhere classified: Secondary | ICD-10-CM | POA: Diagnosis not present

## 2021-03-15 DIAGNOSIS — I10 Essential (primary) hypertension: Secondary | ICD-10-CM | POA: Diagnosis not present

## 2021-03-15 DIAGNOSIS — L659 Nonscarring hair loss, unspecified: Secondary | ICD-10-CM | POA: Diagnosis not present

## 2021-03-17 DIAGNOSIS — H52223 Regular astigmatism, bilateral: Secondary | ICD-10-CM | POA: Diagnosis not present

## 2021-03-17 DIAGNOSIS — H5213 Myopia, bilateral: Secondary | ICD-10-CM | POA: Diagnosis not present

## 2021-05-10 DIAGNOSIS — I110 Hypertensive heart disease with heart failure: Secondary | ICD-10-CM | POA: Diagnosis not present

## 2021-05-10 DIAGNOSIS — I10 Essential (primary) hypertension: Secondary | ICD-10-CM | POA: Diagnosis not present

## 2021-05-10 DIAGNOSIS — I509 Heart failure, unspecified: Secondary | ICD-10-CM | POA: Diagnosis not present

## 2021-07-25 ENCOUNTER — Emergency Department (HOSPITAL_BASED_OUTPATIENT_CLINIC_OR_DEPARTMENT_OTHER)
Admission: EM | Admit: 2021-07-25 | Discharge: 2021-07-25 | Disposition: A | Discharge status: Home / Self Care | Payer: Medicare HMO | Attending: Emergency Medicine | Admitting: Emergency Medicine

## 2021-07-25 ENCOUNTER — Other Ambulatory Visit: Payer: Self-pay

## 2021-07-25 ENCOUNTER — Encounter (HOSPITAL_BASED_OUTPATIENT_CLINIC_OR_DEPARTMENT_OTHER): Payer: Self-pay | Admitting: *Deleted

## 2021-07-25 DIAGNOSIS — Z87891 Personal history of nicotine dependence: Secondary | ICD-10-CM | POA: Insufficient documentation

## 2021-07-25 DIAGNOSIS — I10 Essential (primary) hypertension: Secondary | ICD-10-CM | POA: Diagnosis present

## 2021-07-25 NOTE — ED Provider Notes (Signed)
[facility]-EMERGENCY DEPT [facility] Emergency Department Provider Note MRN:  [id number]  Arrival date & time: 07/25/21     Chief Complaint   Hypertension   History of Present Illness   Brooke Gray is a 75 y.o. year-old female with a history of A. fib, hypertension presenting to the ED with chief complaint of hypertension.  Patient noticed some persistent hypertension today, 160s to 170 systolic.  Explains that this is abnormal for her, usually is in the 120s or 130s.  She checks her blood pressure about once a week.  She has been taking some new supplements over the past few days and is wondering if these are related.  She denies any chest pain, no headache, no vision change, no abdominal pain, no complaints.  Feels normal.  Review of Systems  A complete 10 system review of systems was obtained and all systems are negative except as noted in the HPI and PMH.   Patient's Health History    Past Medical History:  Diagnosis Date   A-fib (HCC)    Cardiomegaly    Glaucoma    Hypertension     Past Surgical History:  Procedure Laterality Date   no past surgery      Family History  Problem Relation Age of Onset   Allergic rhinitis Son    Angioedema Neg Hx    Asthma Neg Hx    Eczema Neg Hx    Immunodeficiency Neg Hx    Urticaria Neg Hx     Social History   Socioeconomic History   Marital status: Married    Spouse name: Not on file   Number of children: Not on file   Years of education: Not on file   Highest education level: Not on file  Occupational History   Not on file  Tobacco Use   Smoking status: Former    Packs/day: 0.25    Years: 30.00    Pack years: 7.50    Types: Cigarettes   Smokeless tobacco: Never  Vaping Use   Vaping Use: Never used  Substance and Sexual Activity   Alcohol use: Yes    Alcohol/week: 1.0 - 2.0 standard drink    Types: 1 - 2 Glasses of wine per week    Comment: occasional   Drug use: No   Sexual activity: Not on file  Other  Topics Concern   Not on file  Social History Narrative   Not on file   Social Determinants of Health   Financial Resource Strain: Not on file  Food Insecurity: Not on file  Transportation Needs: Not on file  Physical Activity: Not on file  Stress: Not on file  Social Connections: Not on file  Intimate Partner Violence: Not on file     Physical Exam   Vitals:   07/25/21 2308 07/25/21 2330  BP: (!) 160/93 (!) 167/92  Pulse: 65   Resp: 16   Temp:    SpO2: 98%     CONSTITUTIONAL: Well-appearing, NAD NEURO:  Alert and oriented x 3, no focal deficits EYES:  eyes equal and reactive ENT/NECK:  no LAD, no JVD CARDIO: Regular rate, well-perfused, normal S1 and S2 PULM:  CTAB no wheezing or rhonchi GI/GU:  normal bowel sounds, non-distended, non-tender MSK/SPINE:  No gross deformities, no edema SKIN:  no rash, atraumatic PSYCH:  Appropriate speech and behavior  *Additional and/or pertinent findings included in MDM below  Diagnostic and Interventional Summary    EKG Interpretation  Date/Time:    Ventricular  Rate:    PR Interval:    QRS Duration:   QT Interval:    QTC Calculation:   R Axis:     Text Interpretation:         Labs Reviewed - No data to display  No orders to display    Medications - No data to display   Procedures  /  Critical Care Procedures  ED Course and Medical Decision Making  I have reviewed the triage vital signs, the nursing notes, and pertinent available records from the EMR.  Listed above are laboratory and imaging tests that I personally ordered, reviewed, and interpreted and then considered in my medical decision making (see below for details).  Asymptomatic hypertension, no symptoms, normal neurological exam, no indication for testing here in the emergency department.  Advised PCP follow-up.       Elmer Sow. Pilar Plate, MD [facility] Emergency Medicine [facility] [email or web address]  Final Clinical Impressions(s) /  ED Diagnoses     ICD-10-CM   1. Hypertension, unspecified type  I10       ED Discharge Orders     None        Discharge Instructions Discussed with and Provided to Patient:   Discharge Instructions   None       Sabas Sous, MD 07/25/21 2359

## 2021-07-25 NOTE — ED Triage Notes (Signed)
Pt reports elevated BP readings tonight. Denies Sx. States "I feel ok"

## 2021-07-25 NOTE — ED Notes (Signed)
Patient states "she is done she isnt waiting any longer and she could have just stayed home".  Explained this is an ER and we are working as fast as we can and we have one provider who is seeing people as fast as he can.

## 2022-07-13 LAB — COLOGUARD: COLOGUARD: POSITIVE — AB

## 2022-12-03 ENCOUNTER — Emergency Department (HOSPITAL_COMMUNITY)
Admission: EM | Admit: 2022-12-03 | Discharge: 2022-12-03 | Disposition: A | Discharge status: Home / Self Care | Payer: Medicare HMO | Attending: Emergency Medicine | Admitting: Emergency Medicine

## 2022-12-03 ENCOUNTER — Emergency Department (HOSPITAL_COMMUNITY): Payer: Medicare HMO

## 2022-12-03 ENCOUNTER — Other Ambulatory Visit: Payer: Self-pay

## 2022-12-03 DIAGNOSIS — Z1152 Encounter for screening for COVID-19: Secondary | ICD-10-CM | POA: Insufficient documentation

## 2022-12-03 DIAGNOSIS — R7989 Other specified abnormal findings of blood chemistry: Secondary | ICD-10-CM | POA: Diagnosis not present

## 2022-12-03 DIAGNOSIS — J9690 Respiratory failure, unspecified, unspecified whether with hypoxia or hypercapnia: Secondary | ICD-10-CM | POA: Diagnosis not present

## 2022-12-03 DIAGNOSIS — R0603 Acute respiratory distress: Secondary | ICD-10-CM | POA: Diagnosis present

## 2022-12-03 DIAGNOSIS — I509 Heart failure, unspecified: Secondary | ICD-10-CM

## 2022-12-03 DIAGNOSIS — Z87891 Personal history of nicotine dependence: Secondary | ICD-10-CM | POA: Insufficient documentation

## 2022-12-03 DIAGNOSIS — I10 Essential (primary) hypertension: Secondary | ICD-10-CM | POA: Insufficient documentation

## 2022-12-03 LAB — RESP PANEL BY RT-PCR (RSV, FLU A&B, COVID)  RVPGX2
Influenza A by PCR: NEGATIVE
Influenza B by PCR: NEGATIVE
Resp Syncytial Virus by PCR: NEGATIVE
SARS Coronavirus 2 by RT PCR: NEGATIVE

## 2022-12-03 LAB — COMPREHENSIVE METABOLIC PANEL
ALT: 37 U/L (ref 0–44)
AST: 45 U/L — ABNORMAL HIGH (ref 15–41)
Albumin: 3.8 g/dL (ref 3.5–5.0)
Alkaline Phosphatase: 48 U/L (ref 38–126)
Anion gap: 11 (ref 5–15)
BUN: 14 mg/dL (ref 8–23)
CO2: 23 mmol/L (ref 22–32)
Calcium: 9.1 mg/dL (ref 8.9–10.3)
Chloride: 105 mmol/L (ref 98–111)
Creatinine, Ser: 0.82 mg/dL (ref 0.44–1.00)
GFR, Estimated: >60 mL/min (ref >60)
Glucose, Bld: 103 mg/dL — ABNORMAL HIGH (ref 70–99)
Potassium: 4.2 mmol/L (ref 3.5–5.1)
Sodium: 139 mmol/L (ref 135–145)
Total Bilirubin: 0.3 mg/dL (ref 0.3–1.2)
Total Protein: 6.4 g/dL — ABNORMAL LOW (ref 6.5–8.1)

## 2022-12-03 LAB — CBC
HCT: 36.4 % (ref 36.0–46.0)
Hemoglobin: 12.9 g/dL (ref 12.0–15.0)
MCH: 31.5 pg (ref 26.0–34.0)
MCHC: 35.4 g/dL (ref 30.0–36.0)
MCV: 89 fL (ref 80.0–100.0)
Platelets: 111 10*3/uL — ABNORMAL LOW (ref 150–400)
RBC: 4.09 MIL/uL (ref 3.87–5.11)
RDW: 12.6 % (ref 11.5–15.5)
WBC: 5.5 10*3/uL (ref 4.0–10.5)
nRBC: 0 % (ref 0.0–0.2)

## 2022-12-03 LAB — BRAIN NATRIURETIC PEPTIDE: B Natriuretic Peptide: 704 pg/mL — ABNORMAL HIGH (ref 0.0–100.0)

## 2022-12-03 LAB — TROPONIN I (HIGH SENSITIVITY)
Troponin I (High Sensitivity): 237 ng/L (ref <18)
Troponin I (High Sensitivity): 235 ng/L (ref <18)

## 2022-12-03 MED ORDER — IOHEXOL 350 MG/ML SOLN
75.0000 mL | Freq: Once | INTRAVENOUS | Status: AC | PRN
  Administered 2022-12-03: 75 mL via INTRAVENOUS

## 2022-12-03 MED ORDER — FUROSEMIDE 10 MG/ML IJ SOLN
40.0000 mg | Freq: Once | INTRAMUSCULAR | Status: AC
  Administered 2022-12-03: 40 mg via INTRAVENOUS
  Filled 2022-12-03: qty 4

## 2022-12-03 NOTE — Progress Notes (Signed)
RT called to bedside, Pt placed on bipap and seems to be tolerating it well at this time, sats currently 96%.

## 2022-12-03 NOTE — ED Triage Notes (Signed)
Pt BIB [facility] from home d/t concerns for respirtatory distress. EMS reports pt found 52% on room air and bilateral wheezing. Given Duo Neb x2, placed on Cpap with improvement 96%. Pt noted to have elevated BP. BP 220/110. Given NTG x2. Pt endorses recent BP medication change, discontinued two of three BP medications. Pt transferred onto BiPap by RT on arrival

## 2022-12-03 NOTE — ED Notes (Signed)
BMP recollected and sent to the lab

## 2022-12-03 NOTE — ED Notes (Signed)
EDP at bedside  

## 2022-12-03 NOTE — Progress Notes (Signed)
Patient transported from [facility] to CT and back with no complications noted. No respiratory distress indicated during RT assessment. RT removed BIPAP mask and placed BIPAP on standby. Pt's vitals are stable on room air. RT will monitor as needed.

## 2022-12-03 NOTE — ED Provider Notes (Signed)
77 yo female presenting with acute SOB, requiring bipap on arrival +Pulm edema on arrival Troponin levels chronically elevated BNP 704  Pending CT PE study  Physical Exam  BP (!) 159/93   Pulse (!) 57   Temp 98 F (36.7 C) (Temporal)   Resp 11   Ht 5\' 3"  (1.6 m)   Wt 58.1 kg   SpO2 100%   BMI 22.67 kg/m   Physical Exam Constitutional:      General: She is not in acute distress. HENT:     Head: Normocephalic and atraumatic.  Eyes:     Conjunctiva/sclera: Conjunctivae normal.     Pupils: Pupils are equal, round, and reactive to light.  Cardiovascular:     Rate and Rhythm: Normal rate and regular rhythm.  Pulmonary:     Effort: Pulmonary effort is normal. No respiratory distress.     Breath sounds: Normal breath sounds.  Abdominal:     General: There is no distension.     Tenderness: There is no abdominal tenderness.  Skin:    General: Skin is warm and dry.  Neurological:     General: No focal deficit present.     Mental Status: She is alert. Mental status is at baseline.  Psychiatric:        Mood and Affect: Mood normal.        Behavior: Behavior normal.     Procedures  Procedures  ED Course / MDM    Medical Decision Making Amount and/or Complexity of Data Reviewed Labs: ordered. Radiology: ordered.  Risk Prescription drug management. Decision regarding hospitalization.   CT PE study was unremarkable.  The patient has small pleural effusion but no evidence of pneumonia or pulmonary embolism.  She was weaned off of the BiPAP and remained stable on room air for approximately 3 hours of observation, breathing comfortably on my repeat reassessments, satting 96-98% on room air.  She was given IV Lasix and produced about a liter of urine, feeling better afterwards.  I suspect she may have experienced a flash pulmonary edema, although the underlying cause is unclear.  It may be due to the fact that she was taken off of her long-term torsemide very recently, 2 days  ago, and she may have had a rebound effect.  She will need to follow-up with her cardiologist.  Will restart the torsemide in the meantime.  The patient and her husband verbalized understanding.  However there is no other acute indication for hospitalization at this time as she is clinically stable appearing, and asymptomatic.       Wyvonnia Dusky, MD 12/03/22 1014

## 2022-12-03 NOTE — Discharge Instructions (Addendum)
I would recommend that you take torsemide at home 5 mg twice a day for the next 5 days.  Continue with your other medications at home including the carvedilol that was started by your cardiologist.  You will need to follow-up with the cardiologist as soon as possible for this episode.  If you have sudden or worsening chest pain, difficulty breathing, lightheadedness, loss of consciousness, please call 911 and return to the ER immediately.

## 2022-12-03 NOTE — ED Provider Notes (Signed)
[facility] Emergency Department Provider Note MRN:  [id number]  Arrival date & time: 12/03/22     Chief Complaint   Respiratory Distress   History of Present Illness   Brooke Gray is a 77 y.o. year-old female with a history of A-fib presenting to the ED with chief complaint of respiratory distress.  Sudden onset shortness of breath this evening, no recent fever or cough, no chest pain, no recent leg pain or swelling.  Received CPAP, nitro, breathing treatments with EMS.  Feeling better.  Review of Systems  A thorough review of systems was obtained and all systems are negative except as noted in the HPI and PMH.   Patient's Health History    Past Medical History:  Diagnosis Date   A-fib ([place])    Cardiomegaly    Glaucoma    Hypertension     Past Surgical History:  Procedure Laterality Date   no past surgery      Family History  Problem Relation Age of Onset   Allergic rhinitis Son    Angioedema Neg Hx    Asthma Neg Hx    Eczema Neg Hx    Immunodeficiency Neg Hx    Urticaria Neg Hx     Social History   Socioeconomic History   Marital status: Married    Spouse name: Not on file   Number of children: Not on file   Years of education: Not on file   Highest education level: Not on file  Occupational History   Not on file  Tobacco Use   Smoking status: Former    Packs/day: 0.25    Years: 30.00    Total pack years: 7.50    Types: Cigarettes   Smokeless tobacco: Never  Vaping Use   Vaping Use: Never used  Substance and Sexual Activity   Alcohol use: Yes    Alcohol/week: 1.0 - 2.0 standard drink of alcohol    Types: 1 - 2 Glasses of wine per week    Comment: occasional   Drug use: No   Sexual activity: Not on file  Other Topics Concern   Not on file  Social History Narrative   Not on file   Social Determinants of Health   Financial Resource Strain: Not on file  Food Insecurity: Not on file  Transportation Needs: Not on file   Physical Activity: Not on file  Stress: Not on file  Social Connections: Not on file  Intimate Partner Violence: Not on file     Physical Exam   Vitals:   12/03/22 0630 12/03/22 0700  BP: (!) 161/101 (!) 159/93  Pulse: (!) 59 (!) 57  Resp: 12 11  Temp:    SpO2: 100% 100%    CONSTITUTIONAL: Well-appearing, NAD NEURO/PSYCH:  Alert and oriented x 3, no focal deficits EYES:  eyes equal and reactive ENT/NECK:  no LAD, no JVD CARDIO: Regular rate, well-perfused, normal S1 and S2 PULM:  CTAB no wheezing or rhonchi, appears relatively comfortable on BiPAP GI/GU:  non-distended, non-tender MSK/SPINE:  No gross deformities, no edema SKIN:  no rash, atraumatic   *Additional and/or pertinent findings included in MDM below  Diagnostic and Interventional Summary    EKG Interpretation  Date/Time:  Saturday December 03 2022 02:16:17 EST Ventricular Rate:  63 PR Interval:  196 QRS Duration: 96 QT Interval:  454 QTC Calculation: 465 R Axis:   -62 Text Interpretation: Sinus rhythm Ventricular premature complex Consider left atrial enlargement Left anterior fascicular block Abnormal R-wave  progression, late transition Left ventricular hypertrophy ST elevation, consider inferior injury Confirmed by Gerlene Fee [phone]) on 12/03/2022 3:05:55 AM       Labs Reviewed  CBC - Abnormal; Notable for the following components:      Result Value   Platelets 111 (*)    All other components within normal limits  BRAIN NATRIURETIC PEPTIDE - Abnormal; Notable for the following components:   B Natriuretic Peptide 704.0 (*)    All other components within normal limits  COMPREHENSIVE METABOLIC PANEL - Abnormal; Notable for the following components:   Glucose, Bld 103 (*)    Total Protein 6.4 (*)    AST 45 (*)    All other components within normal limits  TROPONIN I (HIGH SENSITIVITY) - Abnormal; Notable for the following components:   Troponin I (High Sensitivity) 237 (*)    All other  components within normal limits  TROPONIN I (HIGH SENSITIVITY) - Abnormal; Notable for the following components:   Troponin I (High Sensitivity) 235 (*)    All other components within normal limits  RESP PANEL BY RT-PCR (RSV, FLU A&B, COVID)  [id number]    DG Chest Portable 1 View  Final Result    CT Angio Chest Pulmonary Embolism (PE) W or WO Contrast    (Results Pending)    Medications - No data to display   Procedures  /  Critical Care .Critical Care  Performed by: Maudie Flakes, MD Authorized by: Maudie Flakes, MD   Critical care provider statement:    Critical care time (minutes):  35   Critical care was necessary to treat or prevent imminent or life-threatening deterioration of the following conditions:  Respiratory failure   Critical care was time spent personally by me on the following activities:  Development of treatment plan with patient or surrogate, discussions with consultants, evaluation of patient's response to treatment, examination of patient, ordering and review of laboratory studies, ordering and review of radiographic studies, ordering and performing treatments and interventions, pulse oximetry, re-evaluation of patient's condition and review of old charts   ED Course and Medical Decision Making  Initial Impression and Ddx Differential diagnosis includes PE, CHF exacerbation, less likely asthma or COPD given lack of history.  Will need CTA given the abrupt onset.   Past medical/surgical history that increases complexity of ED encounter: Cardiomegaly  Interpretation of Diagnostics I personally reviewed the EKG and my interpretation is as follows: Sinus rhythm  Labs reviewed no significant blood count or electrolyte disturbance, troponin greater than 200, BNP elevated  Patient Reassessment and Ultimate Disposition/Management     Awaiting CT, will need admission.  Signed out to oncoming provider at shift change.  Patient management required discussion with  the following services or consulting groups:  None  Complexity of Problems Addressed Acute illness or injury that poses threat of life of bodily function  Additional Data Reviewed and Analyzed Further history obtained from: Further history from spouse/family member and Prior labs/imaging results  Additional Factors Impacting ED Encounter Risk Consideration of hospitalization  Barth Kirks. Sedonia Small, MD [place] [email or web address]  Final Clinical Impressions(s) / ED Diagnoses     ICD-10-CM   1. Respiratory distress  R06.03       ED Discharge Orders     None        Discharge Instructions Discussed with and Provided to Patient:   Discharge Instructions   None      Maudie Flakes, MD 12/03/22  0729  

## 2022-12-03 NOTE — ED Notes (Signed)
Pt transported to CT with RN and RT

## 2022-12-07 ENCOUNTER — Observation Stay (HOSPITAL_BASED_OUTPATIENT_CLINIC_OR_DEPARTMENT_OTHER): Payer: Medicare HMO

## 2022-12-07 ENCOUNTER — Inpatient Hospital Stay (HOSPITAL_COMMUNITY)
Admission: EM | Admit: 2022-12-07 | Discharge: 2022-12-10 | DRG: 291 | Disposition: A | Discharge status: Home / Self Care | Payer: Medicare HMO | Attending: Internal Medicine | Admitting: Internal Medicine

## 2022-12-07 ENCOUNTER — Emergency Department (HOSPITAL_COMMUNITY): Payer: Medicare HMO

## 2022-12-07 DIAGNOSIS — J9601 Acute respiratory failure with hypoxia: Secondary | ICD-10-CM | POA: Diagnosis not present

## 2022-12-07 DIAGNOSIS — I150 Renovascular hypertension: Secondary | ICD-10-CM | POA: Diagnosis present

## 2022-12-07 DIAGNOSIS — Z87891 Personal history of nicotine dependence: Secondary | ICD-10-CM

## 2022-12-07 DIAGNOSIS — J810 Acute pulmonary edema: Secondary | ICD-10-CM

## 2022-12-07 DIAGNOSIS — I480 Paroxysmal atrial fibrillation: Secondary | ICD-10-CM | POA: Diagnosis present

## 2022-12-07 DIAGNOSIS — I422 Other hypertrophic cardiomyopathy: Secondary | ICD-10-CM | POA: Diagnosis present

## 2022-12-07 DIAGNOSIS — Z7984 Long term (current) use of oral hypoglycemic drugs: Secondary | ICD-10-CM

## 2022-12-07 DIAGNOSIS — I444 Left anterior fascicular block: Secondary | ICD-10-CM | POA: Diagnosis present

## 2022-12-07 DIAGNOSIS — I5033 Acute on chronic diastolic (congestive) heart failure: Secondary | ICD-10-CM | POA: Diagnosis present

## 2022-12-07 DIAGNOSIS — I160 Hypertensive urgency: Secondary | ICD-10-CM | POA: Diagnosis not present

## 2022-12-07 DIAGNOSIS — R7989 Other specified abnormal findings of blood chemistry: Secondary | ICD-10-CM

## 2022-12-07 DIAGNOSIS — I5031 Acute diastolic (congestive) heart failure: Secondary | ICD-10-CM | POA: Diagnosis not present

## 2022-12-07 DIAGNOSIS — H409 Unspecified glaucoma: Secondary | ICD-10-CM | POA: Diagnosis present

## 2022-12-07 DIAGNOSIS — R7401 Elevation of levels of liver transaminase levels: Secondary | ICD-10-CM

## 2022-12-07 DIAGNOSIS — I472 Ventricular tachycardia, unspecified: Secondary | ICD-10-CM | POA: Diagnosis not present

## 2022-12-07 DIAGNOSIS — I110 Hypertensive heart disease with heart failure: Secondary | ICD-10-CM | POA: Diagnosis not present

## 2022-12-07 DIAGNOSIS — Z8673 Personal history of transient ischemic attack (TIA), and cerebral infarction without residual deficits: Secondary | ICD-10-CM

## 2022-12-07 DIAGNOSIS — I3139 Other pericardial effusion (noninflammatory): Secondary | ICD-10-CM | POA: Diagnosis present

## 2022-12-07 DIAGNOSIS — Z880 Allergy status to penicillin: Secondary | ICD-10-CM

## 2022-12-07 DIAGNOSIS — E785 Hyperlipidemia, unspecified: Secondary | ICD-10-CM | POA: Diagnosis present

## 2022-12-07 DIAGNOSIS — Z1152 Encounter for screening for COVID-19: Secondary | ICD-10-CM

## 2022-12-07 DIAGNOSIS — I161 Hypertensive emergency: Secondary | ICD-10-CM | POA: Diagnosis present

## 2022-12-07 DIAGNOSIS — N179 Acute kidney failure, unspecified: Secondary | ICD-10-CM | POA: Diagnosis present

## 2022-12-07 DIAGNOSIS — D696 Thrombocytopenia, unspecified: Secondary | ICD-10-CM | POA: Diagnosis present

## 2022-12-07 DIAGNOSIS — Z79899 Other long term (current) drug therapy: Secondary | ICD-10-CM

## 2022-12-07 LAB — ECHOCARDIOGRAM COMPLETE
AR max vel: 2.12 cm2
AV Area VTI: 1.96 cm2
AV Area mean vel: 1.99 cm2
AV Mean grad: 3 mmHg
AV Peak grad: 5.5 mmHg
Ao pk vel: 1.17 m/s
Area-P 1/2: 3.42 cm2
Calc EF: 55.4 %
MV M vel: 4.21 m/s
MV Peak grad: 70.9 mmHg
P 1/2 time: 1309 msec
S' Lateral: 2.1 cm
Single Plane A2C EF: 49.3 %
Single Plane A4C EF: 58.8 %

## 2022-12-07 LAB — RESPIRATORY PANEL BY PCR

## 2022-12-07 LAB — CBC
HCT: 38.9 % (ref 36.0–46.0)
Hemoglobin: 13.4 g/dL (ref 12.0–15.0)
MCH: 30.9 pg (ref 26.0–34.0)
MCHC: 34.4 g/dL (ref 30.0–36.0)
MCV: 89.8 fL (ref 80.0–100.0)
Platelets: 128 10*3/uL — ABNORMAL LOW (ref 150–400)
RBC: 4.33 MIL/uL (ref 3.87–5.11)
RDW: 12.3 % (ref 11.5–15.5)
WBC: 7.8 10*3/uL (ref 4.0–10.5)
nRBC: 0 % (ref 0.0–0.2)

## 2022-12-07 LAB — COMPREHENSIVE METABOLIC PANEL
ALT: 50 U/L — ABNORMAL HIGH (ref 0–44)
AST: 60 U/L — ABNORMAL HIGH (ref 15–41)
Albumin: 3.8 g/dL (ref 3.5–5.0)
Alkaline Phosphatase: 64 U/L (ref 38–126)
Anion gap: 11 (ref 5–15)
BUN: 15 mg/dL (ref 8–23)
CO2: 26 mmol/L (ref 22–32)
Calcium: 9.2 mg/dL (ref 8.9–10.3)
Chloride: 103 mmol/L (ref 98–111)
Creatinine, Ser: 1.03 mg/dL — ABNORMAL HIGH (ref 0.44–1.00)
GFR, Estimated: 57 mL/min — ABNORMAL LOW (ref >60)
Glucose, Bld: 185 mg/dL — ABNORMAL HIGH (ref 70–99)
Potassium: 4.2 mmol/L (ref 3.5–5.1)
Sodium: 140 mmol/L (ref 135–145)
Total Bilirubin: 0.6 mg/dL (ref 0.3–1.2)
Total Protein: 6.7 g/dL (ref 6.5–8.1)

## 2022-12-07 LAB — RESP PANEL BY RT-PCR (RSV, FLU A&B, COVID)  RVPGX2
Influenza A by PCR: NEGATIVE
Influenza B by PCR: NEGATIVE
Resp Syncytial Virus by PCR: NEGATIVE
SARS Coronavirus 2 by RT PCR: NEGATIVE

## 2022-12-07 LAB — LIPID PANEL
Cholesterol: 225 mg/dL — ABNORMAL HIGH (ref 0–200)
HDL: 73 mg/dL (ref >40)
LDL Cholesterol: 143 mg/dL — ABNORMAL HIGH (ref 0–99)
Total CHOL/HDL Ratio: 3.1 RATIO
Triglycerides: 46 mg/dL (ref <150)
VLDL: 9 mg/dL (ref 0–40)

## 2022-12-07 LAB — TROPONIN I (HIGH SENSITIVITY)
Troponin I (High Sensitivity): 320 ng/L (ref <18)
Troponin I (High Sensitivity): 355 ng/L (ref <18)

## 2022-12-07 LAB — PROCALCITONIN: Procalcitonin: 0.21 ng/mL

## 2022-12-07 LAB — BRAIN NATRIURETIC PEPTIDE: B Natriuretic Peptide: 420.9 pg/mL — ABNORMAL HIGH (ref 0.0–100.0)

## 2022-12-07 MED ORDER — FUROSEMIDE 10 MG/ML IJ SOLN
80.0000 mg | Freq: Once | INTRAMUSCULAR | Status: AC
  Administered 2022-12-07: 80 mg via INTRAVENOUS
  Filled 2022-12-07: qty 8

## 2022-12-07 MED ORDER — SACUBITRIL-VALSARTAN 49-51 MG PO TABS
1.0000 | ORAL_TABLET | Freq: Two times a day (BID) | ORAL | Status: DC
  Administered 2022-12-08: 1 via ORAL
  Filled 2022-12-07: qty 1

## 2022-12-07 MED ORDER — POTASSIUM CHLORIDE CRYS ER 20 MEQ PO TBCR
40.0000 meq | EXTENDED_RELEASE_TABLET | ORAL | Status: AC
  Administered 2022-12-07: 40 meq via ORAL
  Filled 2022-12-07: qty 2

## 2022-12-07 MED ORDER — CARVEDILOL 25 MG PO TABS
25.0000 mg | ORAL_TABLET | Freq: Two times a day (BID) | ORAL | Status: DC
  Administered 2022-12-07 – 2022-12-09 (×4): 25 mg via ORAL
  Filled 2022-12-07: qty 1
  Filled 2022-12-07: qty 2
  Filled 2022-12-07: qty 1
  Filled 2022-12-07: qty 2

## 2022-12-07 MED ORDER — HYDROCODONE-ACETAMINOPHEN 5-325 MG PO TABS: 1.0000 | ORAL_TABLET | Freq: Four times a day (QID) | ORAL | Status: DC | PRN

## 2022-12-07 MED ORDER — FUROSEMIDE 10 MG/ML IJ SOLN
40.0000 mg | Freq: Two times a day (BID) | INTRAMUSCULAR | Status: DC
  Administered 2022-12-07 – 2022-12-08 (×3): 40 mg via INTRAVENOUS
  Filled 2022-12-07 (×3): qty 4

## 2022-12-07 MED ORDER — ACETAMINOPHEN 650 MG RE SUPP: 650.0000 mg | Freq: Four times a day (QID) | RECTAL | Status: DC | PRN

## 2022-12-07 MED ORDER — FLUTICASONE PROPIONATE 50 MCG/ACT NA SUSP
2.0000 | Freq: Every day | NASAL | Status: DC
  Filled 2022-12-07: qty 16

## 2022-12-07 MED ORDER — SODIUM CHLORIDE 0.9% FLUSH
3.0000 mL | Freq: Two times a day (BID) | INTRAVENOUS | Status: DC
  Administered 2022-12-07 – 2022-12-10 (×6): 3 mL via INTRAVENOUS

## 2022-12-07 MED ORDER — ACETAMINOPHEN 325 MG PO TABS
650.0000 mg | ORAL_TABLET | Freq: Four times a day (QID) | ORAL | Status: DC | PRN
  Administered 2022-12-07: 650 mg via ORAL
  Filled 2022-12-07: qty 2

## 2022-12-07 MED ORDER — ALBUTEROL SULFATE (2.5 MG/3ML) 0.083% IN NEBU: 2.5000 mg | INHALATION_SOLUTION | RESPIRATORY_TRACT | Status: DC | PRN

## 2022-12-07 MED ORDER — LEVOFLOXACIN IN D5W 750 MG/150ML IV SOLN
750.0000 mg | Freq: Once | INTRAVENOUS | Status: AC
  Administered 2022-12-07: 750 mg via INTRAVENOUS
  Filled 2022-12-07: qty 150

## 2022-12-07 MED ORDER — NITROGLYCERIN IN D5W 200-5 MCG/ML-% IV SOLN
0.0000 ug/min | INTRAVENOUS | Status: DC
  Administered 2022-12-07: 5 ug/min via INTRAVENOUS
  Filled 2022-12-07: qty 250

## 2022-12-07 MED ORDER — ONDANSETRON HCL 4 MG/2ML IJ SOLN
4.0000 mg | Freq: Four times a day (QID) | INTRAMUSCULAR | Status: DC | PRN
  Filled 2022-12-07: qty 2

## 2022-12-07 MED ORDER — LATANOPROST 0.005 % OP SOLN
1.0000 [drp] | Freq: Every day | OPHTHALMIC | Status: DC
  Filled 2022-12-07: qty 2.5

## 2022-12-07 MED ORDER — ONDANSETRON HCL 4 MG PO TABS: 4.0000 mg | ORAL_TABLET | Freq: Four times a day (QID) | ORAL | Status: DC | PRN

## 2022-12-07 MED ORDER — ENOXAPARIN SODIUM 40 MG/0.4ML IJ SOSY
40.0000 mg | PREFILLED_SYRINGE | INTRAMUSCULAR | Status: DC
  Administered 2022-12-07: 40 mg via SUBCUTANEOUS
  Filled 2022-12-07 (×4): qty 0.4

## 2022-12-07 MED ORDER — SPIRONOLACTONE 12.5 MG HALF TABLET: 12.5000 mg | ORAL_TABLET | Freq: Every day | ORAL | Status: DC

## 2022-12-07 NOTE — ED Triage Notes (Signed)
Pt to ED via EMS. Pt was sating 52% on RA when EMS arrived. EMS placed pt on NRB sats up to 90% on NRB with EMS. EMS placed pt on CPAP. EMS noted decreased breath sounds bilaterally. Pt is pale and diaphoretic. Pt's RR labored. EMS gave epi, solumedrol, mag, and 2 duo nebs en route. Pt denies any lung dx hx. EMS reports 1 episode of vomiting en route.

## 2022-12-07 NOTE — Consult Note (Addendum)
Advanced Heart Failure Team Consult Note   Primary Physician: Benito Mccreedy, MD PCP-Cardiologist:  None  Reason for Consultation: acute CHF   HPI:    Brooke Gray is seen today for evaluation of acute CHF at the request of Dr. Tamala Julian, Internal Medicine.   77 y/o female w/ h/o poorly controlled HTN, chronic diastolic dysfunction w/ severe LVH by echocardiogram, TIA, PAF, bradycardia, HLD, remote tobacco use and ascending aortic aneurysm.   Admitted w/ TIA 11/2019. BP noted to be markedly elevated. 2D echo showed normal LVEF 60-65% + severe LVH w/ mild speckled appearance, GIDD and normal RV. Wore heart monitor after discharge which detected afib. Was referred to Afib clinic and recommended to start [place]. Started Eliquis. Noted intolerance to high dose AVN blockers given bradycardia w/ occasional HRs in the 30s-40s. Unfortunately lost to f/u and not seen by [facility] since 12/2019. Per Care Everywhere, was seen at [facility] 6/21 but no f/u since. Says she is now followed by [facility]. She has been on antihypertensives and diuretic therapy w/ torsemide.   Recently seen in the [facility] ED on 12/03/22 for acute dyspnea and respiratory distress, required BiPAP. Chest CT negative for PE. CXR showed pulmonary edema. BNP elevated 407. HS trop mildly elevated w/ flat trend, 237>>235. BP was elevated but not markedly high, 159/93. She was given dose of IV Lasix w/ good UOP and symptomatic response. Able to wean off BiPAP back to room air. Monitored in ED x 3 hrs post treatment w/ stable O2 sats and was discharged home.  Now presents back to ED via EMS w/ recurrent respiratory distress. EMS reports on arrival, patient's O2 sat was 52% on room air. Patient was placed on NRB w/ slight improvement. Given albuterol nebs, epinephrine, Solu-Medrol and magnesium. In ED, required BiPAP. BP elevated 197/113. Concern for recurrent flash pulmonary edema. Placed on NTG gtt and given 80  mg IV Lasix. BNP 420. CXR w/ trace bilateral pleural effusions + findings concerning for progressive severe multilobar bilateral pneumonia. Respiratory panel negative. WBC nl, 7.8. PCT 0.21. Started on Levaquin. Blood cx pending. EKG NSR 61 bpm w/ LVH. Na 140, Kg 4.2, CO2 26, Scr 1.03 (b/l ~0.80), AST 60, ALT 50. Tbili 0.6.  Hs trop 320.   She is being admitted by IM. AHF team consulted to assist w/ CHF. Echo pending.   Reports good urinary response to 1st dose of IV Lasix. Breathing improved. Now comfortable on RA. Denies CP. Reports full med compliance. No longer taking Eliquis. She claims the cardiologist at [facility] told her she did not have Afib.   Denies FH of CHF. No longer smokes. Works as Gaffer. + history of snoring.    Heart Monitor 12/2019>>mostly SR at 55 bpm, episodes of afib 2/22 and 2/23 with v rates of 160-180 bpm. 2 runs of VT of 4-5 beats.   2D Echo 11/2019 Left ventricular ejection fraction, by visual estimation, is 60 to  65%. The left ventricle has normal function. There is severely increased  left ventricular hypertrophy.   2. Elevated left atrial pressure.   3. Left ventricular diastolic parameters are consistent with Grade I  diastolic dysfunction (impaired relaxation).   4. Global right ventricle has normal systolic function.The right  ventricular size is normal.   5. Left atrial size was moderately dilated.   6. Right atrial size was normal.   7. Trivial pericardial effusion is present.   8. The mitral valve is normal in structure.  Mild mitral valve  regurgitation. No evidence of mitral stenosis.   9. The tricuspid valve is normal in structure.  10. The aortic valve is tricuspid. Aortic valve regurgitation is not  visualized. No evidence of aortic valve sclerosis or stenosis.  11. The pulmonic valve was normal in structure. Pulmonic valve  regurgitation is not visualized.  12. Aortic dilatation noted.  13. There is mild dilatation of the ascending aorta  measuring 38 mm.  14. The inferior vena cava is normal in size with greater than 50%  respiratory variability, suggesting right atrial pressure of 3 mmHg.  15. Normal LV systolic function; grade 1 diastolic dysfunction; severe LVH  (mild speckled appearance; suggest cardiac MRI to R/O HCM or amyloid);  mildly dilated ascending aorta; moderate LAE.  16. The left ventricle has no regional wall motion abnormalities.   FINDINGS   Left Ventricle: Left ventricular ejection fraction, by visual estimation,  is 60 to 65%. The left ventricle has normal function. The left ventricle  has no regional wall motion abnormalities. There is severely increased  left ventricular hypertrophy. Left  ventricular diastolic parameters are consistent with Grade I diastolic  dysfunction (impaired relaxation). Elevated left atrial pressure.   Review of Systems: [y] = yes, [ ]  = no   General: Weight gain [ ] ; Weight loss [ ] ; Anorexia [ ] ; Fatigue [ ] ; Fever [ ] ; Chills [ ] ; Weakness [ ]   Cardiac: Chest pain/pressure [ ] ; Resting SOB [ ] ; Exertional SOB [ Y]; Orthopnea [ ] ; Pedal Edema [ ] ; Palpitations [ ] ; Syncope [ ] ; Presyncope [ ] ; Paroxysmal nocturnal dyspnea[ ]   Pulmonary: Cough [ ] ; Wheezing[ ] ; Hemoptysis[ ] ; Sputum [ ] ; Snoring [Y ]  GI: Vomiting[ ] ; Dysphagia[ ] ; Melena[ ] ; Hematochezia [ ] ; Heartburn[ ] ; Abdominal pain [ ] ; Constipation [ ] ; Diarrhea [ ] ; BRBPR [ ]   GU: Hematuria[ ] ; Dysuria [ ] ; Nocturia[ ]   Vascular: Pain in legs with walking [ ] ; Pain in feet with lying flat [ ] ; Non-healing sores [ ] ; Stroke [ ] ; TIA [ ] ; Slurred speech [ ] ;  Neuro: Headaches[ ] ; Vertigo[ ] ; Seizures[ ] ; Paresthesias[ ] ;Blurred vision [ ] ; Diplopia [ ] ; Vision changes [ ]   Ortho/Skin: Arthritis [ ] ; Joint pain [ ] ; Muscle pain [ ] ; Joint swelling [ ] ; Back Pain [ ] ; Rash [ ]   Psych: Depression[ ] ; Anxiety[ ]   Heme: Bleeding problems [ ] ; Clotting disorders [ ] ; Anemia [ ]   Endocrine: Diabetes [ ] ; Thyroid dysfunction[  ]  Home Medications Prior to Admission medications   Medication Sig Start Date End Date Taking? Authorizing Provider  acetaminophen (TYLENOL) 325 MG tablet Take 2 tablets (650 mg total) by mouth every 4 (four) hours as needed for mild pain (or temp > 37.5 C (99.5 F)). 11/24/19  Yes Debbe Odea, MD  Ascorbic Acid (VITAMIN C) 1000 MG tablet Take 1,000 mg by mouth daily.   Yes [provider]  carvedilol (COREG) 25 MG tablet Take 25 mg by mouth 2 (two) times daily. 11/28/22  Yes [provider]  ferrous sulfate 325 (65 FE) MG EC tablet Take 325 mg by mouth daily as needed (cold hands).   Yes [provider]  fluticasone (FLONASE) 50 MCG/ACT nasal spray Place 2 sprays into both nostrils daily.   Yes [provider]  latanoprost (XALATAN) 0.005 % ophthalmic solution Place 1 drop into both eyes at bedtime.  03/30/19  Yes [provider]  torsemide (DEMADEX) 5 MG tablet Take 2.5  mg by mouth daily. 11/12/22  Yes [provider]  amLODipine (NORVASC) 5 MG tablet Take 1 tablet (5 mg total) by mouth daily. Patient not taking: Reported on 12/07/2022 01/09/20 01/08/21  Sherran Needs, NP  atorvastatin (LIPITOR) 40 MG tablet Take 1 tablet (40 mg total) by mouth daily at 6 PM. Patient not taking: Reported on 12/07/2022 11/24/19   Debbe Odea, MD  carvedilol (COREG CR) 40 MG 24 hr capsule Take 40 mg by mouth daily. Patient not taking: Reported on 12/07/2022 11/30/22   [provider]  Cholecalciferol (VITAMIN D3 PO) Take 5,000 mcg by mouth daily.     [provider]  Cyanocobalamin (VITAMIN B-12 PO) Take 1 tablet by mouth 3 (three) times a week.     [provider]  felodipine (PLENDIL) 5 MG 24 hr tablet Take 5 mg by mouth daily. Patient not taking: Reported on 12/07/2022 11/12/22   [provider]  Magnesium 400 MG CAPS Take 400 mg by mouth daily.  Patient not taking: Reported on 12/07/2022    [provider]   olmesartan (BENICAR) 40 MG tablet Take 40 mg by mouth daily. Patient not taking: Reported on 12/07/2022 11/12/22   [provider]  omeprazole (PRILOSEC) 40 MG capsule Take 40 mg by mouth daily. Patient not taking: Reported on 12/07/2022 10/26/22   [provider]    Past Medical History: Past Medical History:  Diagnosis Date   A-fib ([place])    Cardiomegaly    Glaucoma    Hypertension     Past Surgical History: Past Surgical History:  Procedure Laterality Date   no past surgery      Family History: Family History  Problem Relation Age of Onset   Allergic rhinitis Son    Angioedema Neg Hx    Asthma Neg Hx    Eczema Neg Hx    Immunodeficiency Neg Hx    Urticaria Neg Hx     Social History: Social History   Socioeconomic History   Marital status: Married    Spouse name: Not on file   Number of children: Not on file   Years of education: Not on file   Highest education level: Not on file  Occupational History   Not on file  Tobacco Use   Smoking status: Former    Packs/day: 0.25    Years: 30.00    Total pack years: 7.50    Types: Cigarettes   Smokeless tobacco: Never  Vaping Use   Vaping Use: Never used  Substance and Sexual Activity   Alcohol use: Yes    Alcohol/week: 1.0 - 2.0 standard drink of alcohol    Types: 1 - 2 Glasses of wine per week    Comment: occasional   Drug use: No   Sexual activity: Not on file  Other Topics Concern   Not on file  Social History Narrative   Not on file   Social Determinants of Health   Financial Resource Strain: Not on file  Food Insecurity: Not on file  Transportation Needs: Not on file  Physical Activity: Not on file  Stress: Not on file  Social Connections: Not on file    Allergies:  Allergies  Allergen Reactions   Penicillins Hives    Objective:    Vital Signs:   Temp:  [97.7 F (36.5 C)-98.3 F (36.8 C)] 98.1 F (36.7 C) (01/24 1028) Pulse Rate:  [55-73] 66 (01/24 1028) Resp:   [13-35] 23 (01/24 1028) BP: (104-197)/(78-113) 129/89 (01/24  1028) SpO2:  [91 %-100 %] 99 % (01/24 1028) FiO2 (%):  [40 %] 40 % (01/24 0355)    Weight change: There were no vitals filed for this visit.  Intake/Output:   Intake/Output Summary (Last 24 hours) at 12/07/2022 1329 Last data filed at 12/07/2022 0930 Gross per 24 hour  Intake --  Output 1200 ml  Net -1200 ml      Physical Exam    General:  Well appearing. No resp difficulty HEENT: normal Neck: supple. JVP 10-12 cm . Carotids 2+ bilat; no bruits. No lymphadenopathy or thyromegaly appreciated. Cor: PMI nondisplaced. Regular rate & rhythm. No rubs, gallops or murmurs. Lungs: clear Abdomen: soft, nontender, nondistended. No hepatosplenomegaly. No bruits or masses. Good bowel sounds. Extremities: no cyanosis, clubbing, rash, edema Neuro: alert & orientedx3, cranial nerves grossly intact. moves all 4 extremities w/o difficulty. Affect pleasant   Telemetry   NSR w/ occasional PVCs 60s   EKG    NSR 61 bpm w/ LVH  Labs   Basic Metabolic Panel: Recent Labs  Lab 12/03/22 0438 12/07/22 0344  NA 139 140  K 4.2 4.2  CL 105 103  CO2 23 26  GLUCOSE 103* 185*  BUN 14 15  CREATININE 0.82 1.03*  CALCIUM 9.1 9.2    Liver Function Tests: Recent Labs  Lab 12/03/22 0438 12/07/22 0344  AST 45* 60*  ALT 37 50*  ALKPHOS 48 64  BILITOT 0.3 0.6  PROT 6.4* 6.7  ALBUMIN 3.8 3.8   No results for input(s): "LIPASE", "AMYLASE" in the last 168 hours. No results for input(s): "AMMONIA" in the last 168 hours.  CBC: Recent Labs  Lab 12/03/22 0240 12/07/22 0344  WBC 5.5 7.8  HGB 12.9 13.4  HCT 36.4 38.9  MCV 89.0 89.8  PLT 111* 128*    Cardiac Enzymes: No results for input(s): "CKTOTAL", "CKMB", "CKMBINDEX", "TROPONINI" in the last 168 hours.  BNP: BNP (last 3 results) Recent Labs    12/03/22 0240 12/07/22 0344  BNP 704.0* 420.9*    ProBNP (last 3 results) No results for input(s): "PROBNP" in the  last 8760 hours.   CBG: No results for input(s): "GLUCAP" in the last 168 hours.  Coagulation Studies: No results for input(s): "LABPROT", "INR" in the last 72 hours.   Imaging   DG Chest Port 1 View  Result Date: 12/07/2022 CLINICAL DATA:  77 year old female with history of shortness of breath. EXAM: PORTABLE CHEST 1 VIEW COMPARISON:  Chest x-ray 12/03/2022. FINDINGS: Lung volumes are normal. Worsening diffuse interstitial prominence, widespread peribronchial cuffing, and ill-defined airspace consolidation scattered throughout the lungs bilaterally, most severe throughout the mid to lower lungs (right greater than left). Probable trace bilateral pleural effusions. No pneumothorax. Pulmonary vasculature is largely obscured. Heart size appears borderline enlarged. Upper mediastinal contours are within normal limits. Atherosclerotic calcifications are noted in the thoracic aorta. IMPRESSION: 1. The appearance of the chest is most compatible with progressive severe multilobar bilateral pneumonia, most likely from atypical organisms such as a viral infection. 2. Trace bilateral pleural effusions. 3. Aortic atherosclerosis. Electronically Signed   By: Trudie Reed M.D.   On: 12/07/2022 05:02     Medications:     Current Medications:  enoxaparin (LOVENOX) injection  40 mg Subcutaneous Q24H   furosemide  40 mg Intravenous BID   sodium chloride flush  3 mL Intravenous Q12H    Infusions:     Patient Profile   77 y/o female w/ h/o poorly controlled HTN, chronic diastolic dysfunction w/  severe LVH by echocardiogram, TIA, PAF on longer on a/c, bradycardia, HLD, prior tobacco use and ascending aortic aneurysm, admitted for acute hypoxic respiratory failure in setting of acute CHF/ flash pulmonary edema and hypertensive emergency.   Assessment/Plan   1. Acute CHF/Flash Pulmonary Edema - h/o diastolic heart failure, Echo 2021 EF 60-65%, severe LVH w/ mild speckled appearance - multiple  presentations for flash pulmonary edema in the last wk  - BP improved w/ NGT gtt>>transition to GDMT - continue IV Lasix 40 mg bid  - repeat echo pending. If EF reduced will need R/LHC  - suspect hypertensive CM, though cannot r/o infiltrative CM (prior echo w/ speckled myocardium). Will need cMRI after diuresis  - stop olmesartan, switch to Entresto 49-51 mg bid  - start spiro 12.5 mg after renin:aldosterone lab resulted  - continue home Coreg  - may need eventual Bidil   2. Hypertension / Hypertensive Emergency  - 197/113 in ED >>improved w/ NTG gtt  - long h/o poor control, despite reported med compliance - multiple presentations for flash pulmonary edema - check renal artery dopplers to r/o RAS - R/o other secondary causes, check serum metanepherines, rena:aldosterone ratio  - GDMT titration per above - reports h/o snoring, will need outpatient sleep study   3. PAF - h/o TIA in 2001 w/ afib detection on Holter monitor  - in NSR on EKG today  - recommend restarting a/c for secondary prevention  - continue Coreg for rate control   4. ? PNA - CXR suggestive of possible PNA, PCT 0.21. Respiratory panel negative  - abx per primary team   5. Elevated Hs Trop  - 320, denies CP. Suspect demand ischemia from hypertensive urgency/ CHF  - may need ischemic evaluation if EF markedly reduced on echo   6. AKI  - b/l SCr ~0.8 - 1.03 on admit  - monitor closely w/ diuresis    Length of Stay: 0  Sheleen Conchas, PA-C  12/07/2022, 1:29 PM  Advanced Heart Failure Team Pager [phone] (M-F; 7a - 5p)  Please contact [facility] for night-coverage after hours (4p -7a ) and weekends on [email or web address]

## 2022-12-07 NOTE — ED Notes (Signed)
Patient was given a cup of water. 

## 2022-12-07 NOTE — H&P (Signed)
History and Physical    Patient: Brooke Gray GQQ:[id number] DOB: 27-Mar-1946 DOA: 12/07/2022 DOS: the patient was seen and examined on 12/07/2022 PCP: Jackie Plum, MD  Patient coming from: Via EMS  Chief Complaint:  Chief Complaint  Patient presents with   Respiratory Distress   HPI: Brooke Gray is a 77 y.o. female with medical history significant of hypertension, diastolic CHF last EF 60-65% with grade 1 DD, paroxysmal atrial fibrillation not on anticoagulation who presents with complaints of shortness of breath this morning around 2 AM.  She states that she had not slept been able to sleep last night due to feeling short of breath.  Denies having any chest pain, palpitations, cough, fever, chills, leg swelling, change in weight, or recent sick contacts.  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  She had been told that she had atrial fibrillation back in 2021 after wearing a Holter monitor.  Thereafter she was told that she did not have atrial fibrillation by different cardiologist at [facility] and was started on diltiazem which seem to work for 8 to 9 months.  However, that cardiologist moved away.  The most cardiologist recent she was started on fenlodipine, torsemide, and olmesartan about 6 months ago.  However on these medications patient reported having dry mouth, restlessness and inability to sleep at night, and urinary frequency.  She had followed up with her cardiologist last Monday and was taken off of these medications and started on Coreg.  Patient reports that [place] makes her feel the blues.  She had just recently been seen in the emergency department with complaints of shortness of breath similar to today 4 days ago requiring placement on BiPAP temporarily with BNP elevated at 704.  Patient was given Lasix with significant improvement for which symptoms were thought possibly secondary to flash pulmonary edema after being taken off long-term furosemide  recently.  She was discharged home with recommendation to follow-up with her cardiologist and resume torsemide.  She had done as instructed and was taking Coreg with the 2.5 mg of torosemide.  Her systolic blood pressures have ranged from 130-150.  In route with EMS patient was noted to be hypoxic down to 52% on room air.  Patient was placed on a nonrebreather with improvement to 90% and subsequently transition to CPAP.  Patient had received epinephrine, Solu-Medrol, magnesium sulfate, and 2 DuoNeb breathing treatments and route.  Patient was also noted to have 1 episode of vomiting en route.  In the emergency department patient was noted to be afebrile with respirations elevated to 35, pulse 55-66, blood pressures 104/84-197/113, and O2 saturation maintained initially placed on BiPAP greater than 92%.  Patient was able to be transitioned to 3 L of nasal cannula oxygen.  Labs significant for platelet count 128, creatinine 1.03, glucose 185,and  BNP 420.9.  Chest x-ray noted concern for progressive severe multilobar bilateral pneumonia most likely from atypical organisms such as a viral infection and trace bilateral pleural effusions.  Patient has been given furosemide 80 mg IV, Levaquin 750 mg IV, and placed on a nitroglycerin drip.  Review of Systems: As mentioned in the history of present illness. All other systems reviewed and are negative. Past Medical History:  Diagnosis Date   A-fib (HCC)    Cardiomegaly    Glaucoma    Hypertension    Past Surgical History:  Procedure Laterality Date   no past surgery     Social History:  reports that she has quit smoking. Her  smoking use included cigarettes. She has a 7.50 pack-year smoking history. She has never used smokeless tobacco. She reports current alcohol use of about 1.0 - 2.0 standard drink of alcohol per week. She reports that she does not use drugs.  Allergies  Allergen Reactions   Penicillins Hives    Family History  Problem Relation  Age of Onset   Allergic rhinitis Son    Angioedema Neg Hx    Asthma Neg Hx    Eczema Neg Hx    Immunodeficiency Neg Hx    Urticaria Neg Hx     Prior to Admission medications   Medication Sig Start Date End Date Taking? Authorizing Provider  acetaminophen (TYLENOL) 325 MG tablet Take 2 tablets (650 mg total) by mouth every 4 (four) hours as needed for mild pain (or temp > 37.5 C (99.5 F)). 11/24/19   Debbe Odea, MD  amLODipine (NORVASC) 5 MG tablet Take 1 tablet (5 mg total) by mouth daily. 01/09/20 01/08/21  Sherran Needs, NP  Ascorbic Acid (VITAMIN C) 1000 MG tablet Take 1,000 mg by mouth daily.    [provider]  atorvastatin (LIPITOR) 40 MG tablet Take 1 tablet (40 mg total) by mouth daily at 6 PM. 11/24/19   Debbe Odea, MD  carvedilol (COREG CR) 40 MG 24 hr capsule Take 40 mg by mouth daily. 11/30/22   [provider]  carvedilol (COREG) 25 MG tablet Take 25 mg by mouth 2 (two) times daily. 11/28/22   [provider]  Celery Seed OIL by Does not apply route.    [provider]  Cholecalciferol (VITAMIN D3 PO) Take 5,000 mcg by mouth daily.     [provider]  Cyanocobalamin (VITAMIN B-12 PO) Take 1 tablet by mouth 3 (three) times a week.     [provider]  felodipine (PLENDIL) 5 MG 24 hr tablet Take 5 mg by mouth daily. 11/12/22   [provider]  fluticasone (FLONASE) 50 MCG/ACT nasal spray Place 2 sprays into both nostrils daily.    [provider]  latanoprost (XALATAN) 0.005 % ophthalmic solution Place 1 drop into both eyes at bedtime.  03/30/19   [provider]  Magnesium 400 MG CAPS Take 400 mg by mouth daily.     [provider]  olmesartan (BENICAR) 40 MG tablet Take 40 mg by mouth daily. 11/12/22   [provider]  omeprazole (PRILOSEC) 40 MG capsule Take 40 mg by mouth daily. 10/26/22   [provider]  torsemide (DEMADEX) 5 MG tablet Take 2.5 mg by mouth daily.  11/12/22   [provider]  valsartan-hydrochlorothiazide (DIOVAN-HCT) 160-25 MG tablet Take 1 tablet by mouth daily.    [provider]  zinc sulfate (ZINC-220) 220 (50 Zn) MG capsule Take 220 mg by mouth daily.    [provider]    Physical Exam: Vitals:   12/07/22 0545 12/07/22 0615 12/07/22 0645 12/07/22 0735  BP: (!) 161/86 129/82 104/84   Pulse: (!) 59 64 64   Resp:  (!) 22 20   Temp:    98.3 F (36.8 C)  TempSrc:      SpO2: 93% 94% 97%    Exam  Constitutional: Elderly female who appears younger than stated age no acute distress Eyes: PERRL, lids and conjunctivae normal ENMT: Mucous membranes are moist. Posterior pharynx clear of any exudate or lesions  Neck: normal, supple,   Respiratory: clear to auscultation bilaterally no significant wheezes or crackles appreciated.  O2 saturations currently maintained on room air at this time. Cardiovascular: Regular rate and rhythm, no murmurs / rubs / gallops. No extremity edema.  Abdomen: no tenderness, no masses palpated. Bowel sounds positive.  Musculoskeletal: no clubbing / cyanosis. No joint deformity upper and lower extremities. Good ROM, no contractures. Normal muscle tone.  Skin: no rashes, lesions, ulcers.   Neurologic: CN 2-12 grossly intact.  Able to move all extremities. Psychiatric: Normal judgment and insight. Alert and oriented x 3. Normal mood.   Data Reviewed:  EKG revealed sinus rhythm at 61 bpm with left atrial enlargement and LVH.  Reviewed labs, imaging and pertinent records as noted above in HPI  Assessment and Plan: Acute respiratory failure with hypoxia secondary to diastolic CHF exacerbation Patient presented with complaints of shortness of breath found to be satting 52% on room air with improvement initially on nonrebreather switched to CPAP and then BiPAP, but able to be weaned to 3 L nasal cannula oxygen while in the emergency department.  Patient previously in flash pulmonary  edema.  Chest x-ray noted progressive worsening diffuse interstitial prominence, widespread peribronchial cuffing throughout mid and lower lung fields right worse than the left, and trace bilateral pleural effusions.  Influenza, COVID-19, and RSV screening were negative.  Had initially been started on empiric Levaquin.  Lower suspicion for the possibility of pneumonia as patient has had no cough, fever, or leukocytosis. -Admit to a cardiac telemetry bed -Continuous pulse oximetry with nasal cannula oxygen to maintain O2 saturation greater than 92%.  Wean to room air as tolerated -Strict I&O's and daily weights -Check procalcitonin(0.21) and TSH, -Check echocardiogram -Continue furosemide 80 mg IV twice daily -Entresto and spironolactone added on per cardiology -Discontinue Levaquin due to low procalcitonin -Cardiology consulted, we will follow-up for any further recommendations  Elevated troponin Acute on chronic.  Patient denies any complaints of chest pain, but troponin elevated at 320.  Review of records note patient chronically has had elevated elevated troponin in the 200s since 2021.  She is not on a statin.  Records note prior stress echocardiogram which noted moderate concentric hypertrophy with findings consistent with speckled appearance of the myocardium concerning for amyloid cardiomyopathy that was a low risk study back in 01/2020.  Cardiology recommends checking a cardiac MRI once patient diuresed. -Check lipid panel -Continue to trend cardiac troponin  Hypertensive urgency Blood pressures initially elevated up to 197/113.  Patient had been started on a nitroglycerin drip with improvement in blood pressures.  Patient had been on multiple medications in the past including diltiazem, amlodipine, torsemide, olmesartan, felodipine, and coreg for blood pressure control.  Report having issues with mouth medications except for diltiazem which she reported tolerating adequately  well. -Olmesartan switched to [facility] per cardiology -Continue Coreg -Follow-up renal arterial duplex  Paroxysmal atrial fibrillation Patient with prior history of being in atrial fibrillation, but currently appears to be in sinus rhythm. CHA2DS2-VASc score = 6.  Seems she has been giving conflicting information for which she feels like she should not be on anticoagulation -Patient should be resumed on anticoagulation for secondary prevention  Thrombocytopenia Chronic.  Platelet count 128 on admission, but noted on labs since 2021. -Continue to monitor  Hyperlipidemia Records note total cholesterol  234, HDL 77, LDL 144, and triglycerides 55 in 11/23/2019.  Patient is not on atorvastatin. -Check lipid panel  Transaminitis Acute.  AST 60 and ALT 50 on admission.  AST have been noted to be elevated at 45 on 1/20.  Patient only  drinks alcohol occasionally socially and denies and daily use. -Check hepatitis panel -Check CMP tomorrow morning  Remote history of tobacco abuse Patient reported smoking 1/3 pack of cigarettes per day on average for approximately 30 years.  DVT prophylaxis: Lovenox Advance Care Planning:   Code Status: Full Code    Consults: Cardiology Family Communication: None  Severity of Illness: The appropriate patient status for this patient is OBSERVATION. Observation status is judged to be reasonable and necessary in order to provide the required intensity of service to ensure the patient's safety. The patient's presenting symptoms, physical exam findings, and initial radiographic and laboratory data in the context of their medical condition is felt to place them at decreased risk for further clinical deterioration. Furthermore, it is anticipated that the patient will be medically stable for discharge from the hospital within 2 midnights of admission.   Author: Norval Morton, MD 12/07/2022 7:48 AM  For on call review [email or web address].

## 2022-12-07 NOTE — Progress Notes (Signed)
  Echocardiogram 2D Echocardiogram has been performed.  Brooke Gray 12/07/2022, 2:40 PM

## 2022-12-07 NOTE — ED Provider Notes (Signed)
[facility] Provider Note   CSN: [id number] Arrival date & time: 12/07/22  1497     History  Chief Complaint  Patient presents with   Respiratory Distress   Level 5 caveat due to respiratory distress Brooke Gray is a 77 y.o. female.  The history is provided by the EMS personnel.   Patient presents via EMS for respiratory distress.  EMS reports on arrival patient's O2 sat was 52% on room air.  Patient was placed on nonrebreather with some improvement.  Patient was also given albuterol nebs, epinephrine, Solu-Medrol and magnesium.  She is also responded noninvasive ventilation. Patient did vomit once in route without the mask on    Home Medications Prior to Admission medications   Medication Sig Start Date End Date Taking? Authorizing Provider  acetaminophen (TYLENOL) 325 MG tablet Take 2 tablets (650 mg total) by mouth every 4 (four) hours as needed for mild pain (or temp > 37.5 C (99.5 F)). 11/24/19   Debbe Odea, MD  amLODipine (NORVASC) 5 MG tablet Take 1 tablet (5 mg total) by mouth daily. 01/09/20 01/08/21  Sherran Needs, NP  Ascorbic Acid (VITAMIN C) 1000 MG tablet Take 1,000 mg by mouth daily.    [provider]  atorvastatin (LIPITOR) 40 MG tablet Take 1 tablet (40 mg total) by mouth daily at 6 PM. 11/24/19   Debbe Odea, MD  carvedilol (COREG CR) 40 MG 24 hr capsule Take 40 mg by mouth daily. 11/30/22   [provider]  carvedilol (COREG) 25 MG tablet Take 25 mg by mouth 2 (two) times daily. 11/28/22   [provider]  Celery Seed OIL by Does not apply route.    [provider]  Cholecalciferol (VITAMIN D3 PO) Take 5,000 mcg by mouth daily.     [provider]  Cyanocobalamin (VITAMIN B-12 PO) Take 1 tablet by mouth 3 (three) times a week.     [provider]  felodipine (PLENDIL) 5 MG 24 hr tablet Take 5 mg by mouth daily. 11/12/22   [provider]   fluticasone (FLONASE) 50 MCG/ACT nasal spray Place 2 sprays into both nostrils daily.    [provider]  latanoprost (XALATAN) 0.005 % ophthalmic solution Place 1 drop into both eyes at bedtime.  03/30/19   [provider]  Magnesium 400 MG CAPS Take 400 mg by mouth daily.     [provider]  olmesartan (BENICAR) 40 MG tablet Take 40 mg by mouth daily. 11/12/22   [provider]  omeprazole (PRILOSEC) 40 MG capsule Take 40 mg by mouth daily. 10/26/22   [provider]  torsemide (DEMADEX) 5 MG tablet Take 2.5 mg by mouth daily. 11/12/22   [provider]  valsartan-hydrochlorothiazide (DIOVAN-HCT) 160-25 MG tablet Take 1 tablet by mouth daily.    [provider]  zinc sulfate (ZINC-220) 220 (50 Zn) MG capsule Take 220 mg by mouth daily.    [provider]      Allergies    Penicillins    Review of Systems   Review of Systems  Unable to perform ROS: Severe respiratory distress    Physical Exam Updated Vital Signs BP (!) 161/86   Pulse (!) 59   Temp 97.7 F (36.5 C) (Oral)   Resp (!) 25   SpO2 93%  Physical Exam CONSTITUTIONAL: Elderly, ill-appearing HEAD: Normocephalic/atraumatic EYES: EOMI/PERRL ENMT: Mucous membranes moist, CPAP mask in place NECK: supple no meningeal signs CV:  S1/S2 noted, limited due to CPAP in place LUNGS: Tachypnea, wheezing bilaterally ABDOMEN: soft, nontender NEURO: Pt is awake/alert/appropriate, moves all extremitiesx4.  No facial droop.   EXTREMITIES: No lower extremity edema full ROM SKIN: Cool to touch PSYCH: Anxious  ED Results / Procedures / Treatments   Labs (all labs ordered are listed, but only abnormal results are displayed) Labs Reviewed  COMPREHENSIVE METABOLIC PANEL - Abnormal; Notable for the following components:      Result Value   Glucose, Bld 185 (*)    Creatinine, Ser 1.03 (*)    AST 60 (*)    ALT 50 (*)    GFR, Estimated 57 (*)    All other  components within normal limits  CBC - Abnormal; Notable for the following components:   Platelets 128 (*)    All other components within normal limits  BRAIN NATRIURETIC PEPTIDE - Abnormal; Notable for the following components:   B Natriuretic Peptide 420.9 (*)    All other components within normal limits  RESP PANEL BY RT-PCR (RSV, FLU A&B, COVID)  RVPGX2  CULTURE, BLOOD (ROUTINE X 2)  CULTURE, BLOOD (ROUTINE X 2)    EKG EKG Interpretation  Date/Time:  Wednesday December 07 2022 03:42:28 EST Ventricular Rate:  61 PR Interval:  192 QRS Duration: 100 QT Interval:  458 QTC Calculation: 462 R Axis:   -41 Text Interpretation: Sinus rhythm Consider left atrial enlargement LVH with secondary repolarization abnormality Anterior Q waves, possibly due to LVH Confirmed by Zadie Rhine ([id number]) on 12/07/2022 4:30:16 AM  Radiology DG Chest Port 1 View  Result Date: 12/07/2022 CLINICAL DATA:  77 year old female with history of shortness of breath. EXAM: PORTABLE CHEST 1 VIEW COMPARISON:  Chest x-ray 12/03/2022. FINDINGS: Lung volumes are normal. Worsening diffuse interstitial prominence, widespread peribronchial cuffing, and ill-defined airspace consolidation scattered throughout the lungs bilaterally, most severe throughout the mid to lower lungs (right greater than left). Probable trace bilateral pleural effusions. No pneumothorax. Pulmonary vasculature is largely obscured. Heart size appears borderline enlarged. Upper mediastinal contours are within normal limits. Atherosclerotic calcifications are noted in the thoracic aorta. IMPRESSION: 1. The appearance of the chest is most compatible with progressive severe multilobar bilateral pneumonia, most likely from atypical organisms such as a viral infection. 2. Trace bilateral pleural effusions. 3. Aortic atherosclerosis. Electronically Signed   By: Trudie Reed M.D.   On: 12/07/2022 05:02    Procedures .Critical Care  Performed by: Zadie Rhine, MD Authorized by: Zadie Rhine, MD   Critical care provider statement:    Critical care time (minutes):  50   Critical care start time:  12/07/2022 3:40 AM   Critical care end time:  12/07/2022 4:30 AM   Critical care time was exclusive of:  Separately billable procedures and treating other patients   Critical care was necessary to treat or prevent imminent or life-threatening deterioration of the following conditions:  Respiratory failure and cardiac failure   Critical care was time spent personally by me on the following activities:  Examination of patient, re-evaluation of patient's condition, pulse oximetry, ordering and review of radiographic studies, ordering and review of laboratory studies, ordering and performing treatments and interventions, development of treatment plan with patient or surrogate, evaluation of patient's response to treatment and review of old charts   I assumed direction of critical care for this patient from another provider in my specialty: no     Care discussed with: admitting provider       Medications Ordered in ED  Medications  nitroGLYCERIN 50 mg in dextrose 5 % 250 mL (0.2 mg/mL) infusion (5 mcg/min Intravenous New Bag/Given 12/07/22 0415)  levofloxacin (LEVAQUIN) IVPB 750 mg (750 mg Intravenous New Bag/Given 12/07/22 0527)  furosemide (LASIX) injection 80 mg (80 mg Intravenous Given 12/07/22 0458)    ED Course/ Medical Decision Making/ A&P Clinical Course as of 12/07/22 0551  Wed Dec 07, 2022  0340 Patient arrives respiratory distress on CPAP.  She replaced on BiPAP.  Patient recently seen for shortness of breath and had some pulmonary edema, that was improved and discharged home.  Patient will need to be admitted [DW]  0415 Overall patient feels improved.  Appears she has some component of pulmonary edema.  Patient reports this feels similar to prior episode, but is unsure why these episodes were occurring.  Will likely need diuresis, will start  with BP management, and admit [DW]  [phone] Patient denies any recent fever or significant cough or infectious symptoms.  There is no elevated white count.  History exam is more consistent with acute pulmonary edema rather than pneumonia.  However will order antibiotics as well.  Patient is much improved and will take off BiPAP.  She is awaiting admission [DW]  0550 Overall patient is improved off of BiPAP.  She is tolerating nasal cannula.  She will be admitted.  Discussed with Dr. Loney Loh for admission.  At this time she will need to go to stepdown as she is on a nitro drip [DW]  0551 Patient reports frustration with recent medication changes.  She reports she recently had her blood pressure medicines changed and reports she has gotten worse since that time.  Advised that this will need to be reviewed while she is at hospital.  She reports she is no longer on anticoagulation [DW]    Clinical Course User Index [DW] Zadie Rhine, MD                             Medical Decision Making Amount and/or Complexity of Data Reviewed Labs: ordered. Radiology: ordered.  Risk Prescription drug management. Decision regarding hospitalization.   This patient presents to the ED for concern of shortness of breath, this involves an extensive number of treatment options, and is a complaint that carries with it a high risk of complications and morbidity.  The differential diagnosis includes but is not limited to Acute coronary syndrome, pneumonia, acute pulmonary edema, pneumothorax, acute anemia, pulmonary embolism    Comorbidities that complicate the patient evaluation: Patient's presentation is complicated by their history of hypertension and CHF  Additional history obtained: Additional history obtained from EMS  Records reviewed previous admission documents  Lab Tests: I Ordered, and personally interpreted labs.  The pertinent results include: Hyperglycemia, elevated BNP  Imaging Studies  ordered: I ordered imaging studies including X-ray chest   I independently visualized and interpreted imaging which showed edema versus pneumonia I agree with the radiologist interpretation  Cardiac Monitoring: The patient was maintained on a cardiac monitor.  I personally viewed and interpreted the cardiac monitor which showed an underlying rhythm of:  sinus rhythm  Medicines ordered and prescription drug management: I ordered medication including nitroglycerin for hypertensive emergency Reevaluation of the patient after these medicines showed that the patient    improved    Critical Interventions:   noninvasive ventilation, nitroglycerin IV drip  Consultations Obtained: I requested consultation with the admitting physician Dr. Loney Loh , and discussed  findings as well as  pertinent plan - they recommend: Admit  Reevaluation: After the interventions noted above, I reevaluated the patient and found that they have :improved  Complexity of problems addressed: Patient's presentation is most consistent with  acute presentation with potential threat to life or bodily function  Disposition: After consideration of the diagnostic results and the patient's response to treatment,  I feel that the patent would benefit from admission   .           Final Clinical Impression(s) / ED Diagnoses Final diagnoses:  Acute respiratory failure with hypoxia ([place])  Acute pulmonary edema [facility])    Rx / DC Orders ED Discharge Orders     None         Ripley Fraise, MD 12/07/22 [phone]

## 2022-12-07 NOTE — ED Notes (Signed)
Lab called critical Troponin 320  and Dr Tamala Julian was made aware at 10:27 am

## 2022-12-07 NOTE — ED Notes (Signed)
Pt ambulated to restroom without assistance. NAD noted at this time

## 2022-12-08 ENCOUNTER — Observation Stay (HOSPITAL_COMMUNITY): Payer: Medicare HMO

## 2022-12-08 ENCOUNTER — Encounter (HOSPITAL_COMMUNITY): Payer: Self-pay | Admitting: Internal Medicine

## 2022-12-08 ENCOUNTER — Other Ambulatory Visit: Payer: Self-pay

## 2022-12-08 ENCOUNTER — Other Ambulatory Visit (HOSPITAL_COMMUNITY): Payer: Self-pay

## 2022-12-08 DIAGNOSIS — I5033 Acute on chronic diastolic (congestive) heart failure: Secondary | ICD-10-CM | POA: Diagnosis present

## 2022-12-08 DIAGNOSIS — E785 Hyperlipidemia, unspecified: Secondary | ICD-10-CM | POA: Diagnosis present

## 2022-12-08 DIAGNOSIS — Z87891 Personal history of nicotine dependence: Secondary | ICD-10-CM | POA: Diagnosis not present

## 2022-12-08 DIAGNOSIS — Z7984 Long term (current) use of oral hypoglycemic drugs: Secondary | ICD-10-CM | POA: Diagnosis not present

## 2022-12-08 DIAGNOSIS — N179 Acute kidney failure, unspecified: Secondary | ICD-10-CM | POA: Diagnosis present

## 2022-12-08 DIAGNOSIS — I422 Other hypertrophic cardiomyopathy: Secondary | ICD-10-CM | POA: Diagnosis present

## 2022-12-08 DIAGNOSIS — I160 Hypertensive urgency: Secondary | ICD-10-CM | POA: Diagnosis not present

## 2022-12-08 DIAGNOSIS — Z880 Allergy status to penicillin: Secondary | ICD-10-CM | POA: Diagnosis not present

## 2022-12-08 DIAGNOSIS — I444 Left anterior fascicular block: Secondary | ICD-10-CM | POA: Diagnosis present

## 2022-12-08 DIAGNOSIS — J810 Acute pulmonary edema: Secondary | ICD-10-CM | POA: Diagnosis present

## 2022-12-08 DIAGNOSIS — I110 Hypertensive heart disease with heart failure: Secondary | ICD-10-CM | POA: Diagnosis present

## 2022-12-08 DIAGNOSIS — Z79899 Other long term (current) drug therapy: Secondary | ICD-10-CM | POA: Diagnosis not present

## 2022-12-08 DIAGNOSIS — I509 Heart failure, unspecified: Secondary | ICD-10-CM | POA: Insufficient documentation

## 2022-12-08 DIAGNOSIS — H409 Unspecified glaucoma: Secondary | ICD-10-CM | POA: Diagnosis present

## 2022-12-08 DIAGNOSIS — Z1152 Encounter for screening for COVID-19: Secondary | ICD-10-CM | POA: Diagnosis not present

## 2022-12-08 DIAGNOSIS — J9601 Acute respiratory failure with hypoxia: Secondary | ICD-10-CM | POA: Diagnosis present

## 2022-12-08 DIAGNOSIS — I161 Hypertensive emergency: Secondary | ICD-10-CM | POA: Diagnosis present

## 2022-12-08 DIAGNOSIS — I480 Paroxysmal atrial fibrillation: Secondary | ICD-10-CM | POA: Diagnosis present

## 2022-12-08 DIAGNOSIS — Z8673 Personal history of transient ischemic attack (TIA), and cerebral infarction without residual deficits: Secondary | ICD-10-CM | POA: Diagnosis not present

## 2022-12-08 DIAGNOSIS — I150 Renovascular hypertension: Secondary | ICD-10-CM | POA: Diagnosis present

## 2022-12-08 DIAGNOSIS — D696 Thrombocytopenia, unspecified: Secondary | ICD-10-CM | POA: Diagnosis present

## 2022-12-08 DIAGNOSIS — A419 Sepsis, unspecified organism: Secondary | ICD-10-CM | POA: Insufficient documentation

## 2022-12-08 DIAGNOSIS — I421 Obstructive hypertrophic cardiomyopathy: Secondary | ICD-10-CM | POA: Diagnosis not present

## 2022-12-08 DIAGNOSIS — I3139 Other pericardial effusion (noninflammatory): Secondary | ICD-10-CM | POA: Diagnosis present

## 2022-12-08 DIAGNOSIS — I472 Ventricular tachycardia, unspecified: Secondary | ICD-10-CM | POA: Diagnosis not present

## 2022-12-08 LAB — CBC
HCT: 35.7 % — ABNORMAL LOW (ref 36.0–46.0)
Hemoglobin: 12.4 g/dL (ref 12.0–15.0)
MCH: 30.6 pg (ref 26.0–34.0)
MCHC: 34.7 g/dL (ref 30.0–36.0)
MCV: 88.1 fL (ref 80.0–100.0)
Platelets: 146 10*3/uL — ABNORMAL LOW (ref 150–400)
RBC: 4.05 MIL/uL (ref 3.87–5.11)
RDW: 12.4 % (ref 11.5–15.5)
WBC: 13.7 10*3/uL — ABNORMAL HIGH (ref 4.0–10.5)
nRBC: 0 % (ref 0.0–0.2)

## 2022-12-08 LAB — BASIC METABOLIC PANEL
Anion gap: 6 (ref 5–15)
BUN: 18 mg/dL (ref 8–23)
CO2: 27 mmol/L (ref 22–32)
Calcium: 9.5 mg/dL (ref 8.9–10.3)
Chloride: 104 mmol/L (ref 98–111)
Creatinine, Ser: 0.94 mg/dL (ref 0.44–1.00)
GFR, Estimated: >60 mL/min (ref >60)
Glucose, Bld: 123 mg/dL — ABNORMAL HIGH (ref 70–99)
Potassium: 4.4 mmol/L (ref 3.5–5.1)
Sodium: 137 mmol/L (ref 135–145)

## 2022-12-08 LAB — TSH: TSH: 0.319 u[IU]/mL — ABNORMAL LOW (ref 0.350–4.500)

## 2022-12-08 LAB — HEPATITIS PANEL, ACUTE
HCV Ab: NONREACTIVE
Hep A IgM: NONREACTIVE
Hep B C IgM: NONREACTIVE
Hepatitis B Surface Ag: NONREACTIVE

## 2022-12-08 MED ORDER — SPIRONOLACTONE 12.5 MG HALF TABLET
12.5000 mg | ORAL_TABLET | Freq: Every day | ORAL | Status: DC
  Administered 2022-12-08 – 2022-12-10 (×3): 12.5 mg via ORAL
  Filled 2022-12-08 (×4): qty 1

## 2022-12-08 MED ORDER — GADOBUTROL 1 MMOL/ML IV SOLN
7.0000 mL | Freq: Once | INTRAVENOUS | Status: AC | PRN
  Administered 2022-12-08: 7 mL via INTRAVENOUS

## 2022-12-08 MED ORDER — ATORVASTATIN CALCIUM 40 MG PO TABS
40.0000 mg | ORAL_TABLET | Freq: Every evening | ORAL | Status: DC
  Filled 2022-12-08: qty 1

## 2022-12-08 MED ORDER — EMPAGLIFLOZIN 10 MG PO TABS
10.0000 mg | ORAL_TABLET | Freq: Every day | ORAL | Status: DC
  Administered 2022-12-08 – 2022-12-10 (×3): 10 mg via ORAL
  Filled 2022-12-08 (×3): qty 1

## 2022-12-08 MED ORDER — SACUBITRIL-VALSARTAN 97-103 MG PO TABS
1.0000 | ORAL_TABLET | Freq: Two times a day (BID) | ORAL | Status: DC
  Administered 2022-12-08: 1 via ORAL
  Filled 2022-12-08 (×3): qty 1

## 2022-12-08 NOTE — ED Notes (Addendum)
ED TO INPATIENT HANDOFF REPORT  ED Nurse Name and Phone #: Charlotte Sanes [phone]  S Name/Age/Gender Lucius Conn 77 y.o. female Room/Bed: 014C/014C  Code Status   Code Status: Full Code  Home/SNF/Other Home Patient oriented to: self, place, time, and situation Is this baseline? Yes   Triage Complete: Triage complete  Chief Complaint Hypertensive emergency [I16.1] Acute on chronic respiratory failure with hypoxia [facility]) [J96.21]  Triage Note Pt to ED via EMS. Pt was sating 52% on RA when EMS arrived. EMS placed pt on NRB sats up to 90% on NRB with EMS. EMS placed pt on CPAP. EMS noted decreased breath sounds bilaterally. Pt is pale and diaphoretic. Pt's RR labored. EMS gave epi, solumedrol, mag, and 2 duo nebs en route. Pt denies any lung dx hx. EMS reports 1 episode of vomiting en route.       Allergies Allergies  Allergen Reactions   Penicillins Hives    Level of Care/Admitting Diagnosis ED Disposition     ED Disposition  Admit   Condition  --   Comment  Hospital Area: [facility] [[id number]]  Level of Care: Telemetry Cardiac [[id number]]  May place patient in observation at [facility] or [facility] if equivalent level of care is available:: No  Covid Evaluation: Asymptomatic - no recent exposure (last 10 days) testing not required  Diagnosis: Acute on chronic respiratory failure with hypoxia [facility]) [[id number]]  Admitting Physician: Clydie Braun [[id number]]  Attending Physician: Clydie Braun [[id number]]          B Medical/Surgery History Past Medical History:  Diagnosis Date   A-fib (HCC)    Cardiomegaly    Glaucoma    Hypertension    Past Surgical History:  Procedure Laterality Date   no past surgery       A IV Location/Drains/Wounds Patient Lines/Drains/Airways Status     Active Line/Drains/Airways     Name Placement date Placement time Site Days   Peripheral IV 12/07/22 20 G Posterior;Right Hand 12/07/22  0337  Hand  1    Peripheral IV 12/07/22 20 G Anterior;Left Forearm 12/07/22  0425  Forearm  1   External Urinary Catheter 12/07/22  0505  --  1            Intake/Output Last 24 hours No intake or output data in the 24 hours ending 12/08/22 1131  Labs/Imaging Results for orders placed or performed during the hospital encounter of 12/07/22 (from the past 48 hour(s))  Resp panel by RT-PCR (RSV, Flu A&B, Covid) Anterior Nasal Swab     Status: None   Collection Time: 12/07/22  3:36 AM   Specimen: Anterior Nasal Swab  Result Value Ref Range   SARS Coronavirus 2 by RT PCR NEGATIVE NEGATIVE    Comment: (NOTE) SARS-CoV-2 target nucleic acids are NOT DETECTED.  The SARS-CoV-2 RNA is generally detectable in upper respiratory specimens during the acute phase of infection. The lowest concentration of SARS-CoV-2 viral copies this assay can detect is 138 copies/mL. A negative result does not preclude SARS-Cov-2 infection and should not be used as the sole basis for treatment or other patient management decisions. A negative result may occur with  improper specimen collection/handling, submission of specimen other than nasopharyngeal swab, presence of viral mutation(s) within the areas targeted by this assay, and inadequate number of viral copies(<138 copies/mL). A negative result must be combined with clinical observations, patient history, and epidemiological information. The expected result is Negative.  Fact Sheet for Patients:  EntrepreneurPulse.com.au  Fact Sheet for Healthcare Providers:  IncredibleEmployment.be  This test is no t yet approved or cleared by the [place] FDA and  has been authorized for detection and/or diagnosis of SARS-CoV-2 by FDA under an Emergency Use Authorization (EUA). This EUA will remain  in effect (meaning this test can be used) for the duration of the COVID-19 declaration under Section 564(b)(1) of the Act, 21 U.S.C.section  360bbb-3(b)(1), unless the authorization is terminated  or revoked sooner.       Influenza A by PCR NEGATIVE NEGATIVE   Influenza B by PCR NEGATIVE NEGATIVE    Comment: (NOTE) The Xpert Xpress SARS-CoV-2/FLU/RSV plus assay is intended as an aid in the diagnosis of influenza from Nasopharyngeal swab specimens and should not be used as a sole basis for treatment. Nasal washings and aspirates are unacceptable for Xpert Xpress SARS-CoV-2/FLU/RSV testing.  Fact Sheet for Patients: EntrepreneurPulse.com.au  Fact Sheet for Healthcare Providers: IncredibleEmployment.be  This test is not yet approved or cleared by the [place] FDA and has been authorized for detection and/or diagnosis of SARS-CoV-2 by FDA under an Emergency Use Authorization (EUA). This EUA will remain in effect (meaning this test can be used) for the duration of the COVID-19 declaration under Section 564(b)(1) of the Act, 21 U.S.C. section 360bbb-3(b)(1), unless the authorization is terminated or revoked.     Resp Syncytial Virus by PCR NEGATIVE NEGATIVE    Comment: (NOTE) Fact Sheet for Patients: EntrepreneurPulse.com.au  Fact Sheet for Healthcare Providers: IncredibleEmployment.be  This test is not yet approved or cleared by the [place] FDA and has been authorized for detection and/or diagnosis of SARS-CoV-2 by FDA under an Emergency Use Authorization (EUA). This EUA will remain in effect (meaning this test can be used) for the duration of the COVID-19 declaration under Section 564(b)(1) of the Act, 21 U.S.C. section 360bbb-3(b)(1), unless the authorization is terminated or revoked.  Performed at [facility], [place] [address], [place], [place] [address]   Comprehensive metabolic panel     Status: Abnormal   Collection Time: 12/07/22  3:44 AM  Result Value Ref Range   Sodium 140 135 - 145 mmol/L   Potassium 4.2 3.5 - 5.1  mmol/L   Chloride 103 98 - 111 mmol/L   CO2 26 22 - 32 mmol/L   Glucose, Bld 185 (H) 70 - 99 mg/dL    Comment: Glucose reference range applies only to samples taken after fasting for at least 8 hours.   BUN 15 8 - 23 mg/dL   Creatinine, Ser 1.03 (H) 0.44 - 1.00 mg/dL   Calcium 9.2 8.9 - 10.3 mg/dL   Total Protein 6.7 6.5 - 8.1 g/dL   Albumin 3.8 3.5 - 5.0 g/dL   AST 60 (H) 15 - 41 U/L   ALT 50 (H) 0 - 44 U/L   Alkaline Phosphatase 64 38 - 126 U/L   Total Bilirubin 0.6 0.3 - 1.2 mg/dL   GFR, Estimated 57 (L) >60 mL/min    Comment: (NOTE) Calculated using the CKD-EPI Creatinine Equation (2021)    Anion gap 11 5 - 15    Comment: Performed at [place] [address], [place] [address]  CBC     Status: Abnormal   Collection Time: 12/07/22  3:44 AM  Result Value Ref Range   WBC 7.8 4.0 - 10.5 K/uL   RBC 4.33 3.87 - 5.11 MIL/uL   Hemoglobin 13.4 12.0 - 15.0 g/dL   HCT 38.9  36.0 - 46.0 %   MCV 89.8 80.0 - 100.0 fL   MCH 30.9 26.0 - 34.0 pg   MCHC 34.4 30.0 - 36.0 g/dL   RDW 12.3 11.5 - 15.5 %   Platelets 128 (L) 150 - 400 K/uL    Comment: REPEATED TO VERIFY   nRBC 0.0 0.0 - 0.2 %    Comment: Performed at [facility], [place] [address], [place], [place] [address]  Brain natriuretic peptide     Status: Abnormal   Collection Time: 12/07/22  3:44 AM  Result Value Ref Range   B Natriuretic Peptide 420.9 (H) 0.0 - 100.0 pg/mL    Comment: Performed at [place] [address], [place], [place] [address]  Blood culture (routine x 2)     Status: None (Preliminary result)   Collection Time: 12/07/22  5:20 AM   Specimen: BLOOD RIGHT FOREARM  Result Value Ref Range   Specimen Description BLOOD RIGHT FOREARM    Special Requests      BOTTLES DRAWN AEROBIC AND ANAEROBIC Blood Culture adequate volume   Culture      NO GROWTH 1 DAY Performed at [facility], [place] [address] [place], [place] [address]    Report Status PENDING   Blood culture (routine  x 2)     Status: None (Preliminary result)   Collection Time: 12/07/22  5:27 AM   Specimen: BLOOD LEFT HAND  Result Value Ref Range   Specimen Description BLOOD LEFT HAND    Special Requests      BOTTLES DRAWN AEROBIC AND ANAEROBIC Blood Culture adequate volume   Culture      NO GROWTH 1 DAY Performed at [facility], [place] [address], [place], [place] [address]    Report Status PENDING   Respiratory (~20 pathogens) panel by PCR     Status: None   Collection Time: 12/07/22  8:20 AM   Specimen: Nasopharyngeal Swab; Respiratory  Result Value Ref Range   Adenovirus NOT DETECTED NOT DETECTED   Coronavirus 229E NOT DETECTED NOT DETECTED    Comment: (NOTE) The Coronavirus on the Respiratory Panel, DOES NOT test for the novel  Coronavirus (2019 nCoV)    Coronavirus HKU1 NOT DETECTED NOT DETECTED   Coronavirus NL63 NOT DETECTED NOT DETECTED   Coronavirus OC43 NOT DETECTED NOT DETECTED   Metapneumovirus NOT DETECTED NOT DETECTED   Rhinovirus / Enterovirus NOT DETECTED NOT DETECTED   Influenza A NOT DETECTED NOT DETECTED   Influenza B NOT DETECTED NOT DETECTED   Parainfluenza Virus 1 NOT DETECTED NOT DETECTED   Parainfluenza Virus 2 NOT DETECTED NOT DETECTED   Parainfluenza Virus 3 NOT DETECTED NOT DETECTED   Parainfluenza Virus 4 NOT DETECTED NOT DETECTED   Respiratory Syncytial Virus NOT DETECTED NOT DETECTED   Bordetella pertussis NOT DETECTED NOT DETECTED   Bordetella Parapertussis NOT DETECTED NOT DETECTED   Chlamydophila pneumoniae NOT DETECTED NOT DETECTED   Mycoplasma pneumoniae NOT DETECTED NOT DETECTED    Comment: Performed at [facility], [place] [address], [place], [place] [address]  Troponin I (High Sensitivity)     Status: Abnormal   Collection Time: 12/07/22  8:21 AM  Result Value Ref Range   Troponin I (High Sensitivity) 320 (HH) <18 ng/L    Comment: CRITICAL RESULT CALLED TO, READ BACK BY AND VERIFIED WITH N.WELLINGTON,RN 1007 12/07/22  CLARK,S (NOTE) Elevated high sensitivity troponin I (hsTnI) values and significant  changes across serial measurements may suggest  ACS but many other  chronic and acute conditions are known to elevate hsTnI results.  Refer to the "Links" section for chest pain algorithms and additional  guidance. Performed at [facility], [place] [address] [place], [place] [address]   Procalcitonin - Baseline     Status: None   Collection Time: 12/07/22  8:21 AM  Result Value Ref Range   Procalcitonin 0.21 ng/mL    Comment:        Interpretation: PCT (Procalcitonin) <= 0.5 ng/mL: Systemic infection (sepsis) is not likely. Local bacterial infection is possible. (NOTE)       Sepsis PCT Algorithm           Lower Respiratory Tract                                      Infection PCT Algorithm    ----------------------------     ----------------------------         PCT < 0.25 ng/mL                PCT < 0.10 ng/mL          Strongly encourage             Strongly discourage   discontinuation of antibiotics    initiation of antibiotics    ----------------------------     -----------------------------       PCT 0.25 - 0.50 ng/mL            PCT 0.10 - 0.25 ng/mL               OR       >80% decrease in PCT            Discourage initiation of                                            antibiotics      Encourage discontinuation           of antibiotics    ----------------------------     -----------------------------         PCT >= 0.50 ng/mL              PCT 0.26 - 0.50 ng/mL               AND        <80% decrease in PCT             Encourage initiation of                                             antibiotics       Encourage continuation           of antibiotics    ----------------------------     -----------------------------        PCT >= 0.50 ng/mL                  PCT > 0.50 ng/mL               AND         increase in PCT  Strongly encourage                                       initiation of antibiotics    Strongly encourage escalation           of antibiotics                                     -----------------------------                                           PCT <= 0.25 ng/mL                                                 OR                                        > 80% decrease in PCT                                      Discontinue / Do not initiate                                             antibiotics  Performed at [facility], [place] [address] [place], [place] [address]   Troponin I (High Sensitivity)     Status: Abnormal   Collection Time: 12/07/22  4:34 PM  Result Value Ref Range   Troponin I (High Sensitivity) 355 (HH) <18 ng/L    Comment: CRITICAL VALUE NOTED. VALUE IS CONSISTENT WITH PREVIOUSLY REPORTED/CALLED VALUE (NOTE) Elevated high sensitivity troponin I (hsTnI) values and significant  changes across serial measurements may suggest ACS but many other  chronic and acute conditions are known to elevate hsTnI results.  Refer to the "Links" section for chest pain algorithms and additional  guidance. Performed at [facility], [place] [address], [place], [place] [address]   Lipid panel     Status: Abnormal   Collection Time: 12/07/22  4:34 PM  Result Value Ref Range   Cholesterol 225 (H) 0 - 200 mg/dL   Triglycerides 46 <150 mg/dL   HDL 73 >40 mg/dL   Total CHOL/HDL Ratio 3.1 RATIO   VLDL 9 0 - 40 mg/dL   LDL Cholesterol 143 (H) 0 - 99 mg/dL    Comment:        Total Cholesterol/HDL:CHD Risk Coronary Heart Disease Risk Table                     Men   Women  1/2 Average Risk   3.4   3.3  Average Risk       5.0   4.4  2 X Average Risk   9.6   7.1  3 X Average Risk  23.4  11.0        Use the calculated Patient Ratio above and the CHD Risk Table to determine the patient's CHD Risk.        ATP III CLASSIFICATION (LDL):  <100     mg/dL   Optimal  100-129  mg/dL   Near or Above                    Optimal  130-159   mg/dL   Borderline  160-189  mg/dL   High  >190     mg/dL   Very High Performed at [place] [address], [place], [place] [address]   TSH     Status: Abnormal   Collection Time: 12/07/22  4:34 PM  Result Value Ref Range   TSH 0.319 (L) 0.350 - 4.500 uIU/mL    Comment: Performed by a 3rd Generation assay with a functional sensitivity of <=0.01 uIU/mL. Performed at [facility], [place] [address], [place], [place] [address]   Hepatitis panel, acute     Status: None   Collection Time: 12/07/22  8:40 PM  Result Value Ref Range   Hepatitis B Surface Ag NON REACTIVE NON REACTIVE   HCV Ab NON REACTIVE NON REACTIVE    Comment: (NOTE) Nonreactive HCV antibody screen is consistent with no HCV infections,  unless recent infection is suspected or other evidence exists to indicate HCV infection.     Hep A IgM NON REACTIVE NON REACTIVE   Hep B C IgM NON REACTIVE NON REACTIVE    Comment: Performed at [facility], [place] [address], [place], [place] [address]  CBC     Status: Abnormal   Collection Time: 12/08/22  4:34 AM  Result Value Ref Range   WBC 13.7 (H) 4.0 - 10.5 K/uL   RBC 4.05 3.87 - 5.11 MIL/uL   Hemoglobin 12.4 12.0 - 15.0 g/dL   HCT 35.7 (L) 36.0 - 46.0 %   MCV 88.1 80.0 - 100.0 fL   MCH 30.6 26.0 - 34.0 pg   MCHC 34.7 30.0 - 36.0 g/dL   RDW 12.4 11.5 - 15.5 %   Platelets 146 (L) 150 - 400 K/uL   nRBC 0.0 0.0 - 0.2 %    Comment: Performed at [facility], [place] [address] [place], [place] [address]  Basic metabolic panel     Status: Abnormal   Collection Time: 12/08/22  4:34 AM  Result Value Ref Range   Sodium 137 135 - 145 mmol/L   Potassium 4.4 3.5 - 5.1 mmol/L   Chloride 104 98 - 111 mmol/L   CO2 27 22 - 32 mmol/L   Glucose, Bld 123 (H) 70 - 99 mg/dL    Comment: Glucose reference range applies only to samples taken after fasting for at least 8 hours.   BUN 18 8 - 23 mg/dL   Creatinine, Ser 0.94 0.44 - 1.00 mg/dL   Calcium 9.5 8.9 - 10.3  mg/dL   GFR, Estimated >60 >60 mL/min    Comment: (NOTE) Calculated using the CKD-EPI Creatinine Equation (2021)    Anion gap 6 5 - 15    Comment: Performed at [place] [address], [place], [place] [address]   ECHOCARDIOGRAM COMPLETE  Result Date: 12/07/2022    ECHOCARDIOGRAM REPORT   Patient Name:   [facility] Date of Exam: 12/07/2022 Medical Rec #:  [id number]       Height:  63.0 in Accession #:    [id number]      Weight:       128.0 lb Date of Birth:  [birth year]       BSA:          1.600 m Patient Age:    61 years        BP:           124/75 mmHg Patient Gender: F               HR:           61 bpm. Exam Location:  Inpatient Procedure: 2D Echo Indications:    CHF  History:        Patient has prior history of Echocardiogram examinations, most                 recent 11/23/2019. Hypertrophic Cardiomyopathy and CHF, TIA; Risk                 Factors:Hypertension and Dyslipidemia.  Sonographer:    Harvie Junior Referring Phys: [id number] RONDELL A SMITH IMPRESSIONS  1. See prior recomendation regarding r/o Amyloid given increased septal thickness with specking, pericardial effusoin and thickened valve leaflets Would have patient return for Strain imaging TDI velocities above 10 would argue against amyloid . Left ventricular ejection fraction, by estimation, is 60 to 65%. The left ventricle has normal function. The left ventricle has no regional wall motion abnormalities. There is severe left ventricular hypertrophy. Left ventricular diastolic parameters were normal.  2. Right ventricular systolic function is normal. The right ventricular size is normal. There is normal pulmonary artery systolic pressure.  3. The mitral valve is abnormal. Trivial mitral valve regurgitation. No evidence of mitral stenosis.  4. The aortic valve is tricuspid. Aortic valve regurgitation is not visualized. No aortic stenosis is present.  5. The inferior vena cava is normal in size with greater than 50% respiratory  variability, suggesting right atrial pressure of 3 mmHg. FINDINGS  Left Ventricle: See prior recomendation regarding r/o Amyloid given increased septal thickness with specking, pericardial effusoin and thickened valve leaflets Would have patient return for Strain imaging TDI velocities above 10 would argue against amyloid. Left ventricular ejection fraction, by estimation, is 60 to 65%. The left ventricle has normal function. The left ventricle has no regional wall motion abnormalities. The left ventricular internal cavity size was normal in size. There is severe left ventricular hypertrophy. Left ventricular diastolic parameters were normal. Right Ventricle: The right ventricular size is normal. No increase in right ventricular wall thickness. Right ventricular systolic function is normal. There is normal pulmonary artery systolic pressure. The tricuspid regurgitant velocity is 1.69 m/s, and  with an assumed right atrial pressure of 3 mmHg, the estimated right ventricular systolic pressure is 0000000 mmHg. Left Atrium: Left atrial size was normal in size. Right Atrium: Right atrial size was normal in size. Pericardium: Trivial pericardial effusion is present. The pericardial effusion is posterior to the left ventricle. Mitral Valve: The mitral valve is abnormal. There is mild thickening of the mitral valve leaflet(s). Trivial mitral valve regurgitation. No evidence of mitral valve stenosis. Tricuspid Valve: The tricuspid valve is normal in structure. Tricuspid valve regurgitation is mild . No evidence of tricuspid stenosis. Aortic Valve: The aortic valve is tricuspid. Aortic valve regurgitation is not visualized. Aortic regurgitation PHT measures 1309 msec. No aortic stenosis is present. Aortic valve mean gradient measures 3.0 mmHg. Aortic valve peak gradient measures 5.5 mmHg. Aortic valve area, by  VTI measures 1.96 cm. Pulmonic Valve: The pulmonic valve was normal in structure. Pulmonic valve regurgitation is not  visualized. No evidence of pulmonic stenosis. Aorta: The aortic root is normal in size and structure. Venous: The inferior vena cava is normal in size with greater than 50% respiratory variability, suggesting right atrial pressure of 3 mmHg. IAS/Shunts: No atrial level shunt detected by color flow Doppler.  LEFT VENTRICLE PLAX 2D LVIDd:         3.10 cm     Diastology LVIDs:         2.10 cm     LV e' medial:    4.68 cm/s LV PW:         1.30 cm     LV E/e' medial:  17.1 LV IVS:        1.70 cm     LV e' lateral:   6.42 cm/s LVOT diam:     1.80 cm     LV E/e' lateral: 12.5 LV SV:         50 LV SV Index:   31 LVOT Area:     2.54 cm                             3D Volume EF: LV Volumes (MOD)           3D EF:        59 % LV vol d, MOD A2C: 74.7 ml LV EDV:       90 ml LV vol d, MOD A4C: 88.9 ml LV ESV:       37 ml LV vol s, MOD A2C: 37.8 ml LV SV:        53 ml LV vol s, MOD A4C: 36.6 ml LV SV MOD A2C:     36.9 ml LV SV MOD A4C:     88.9 ml LV SV MOD BP:      46.2 ml RIGHT VENTRICLE RV Basal diam:  3.10 cm RV Mid diam:    2.40 cm RV S prime:     18.30 cm/s TAPSE (M-mode): 1.6 cm LEFT ATRIUM             Index        RIGHT ATRIUM           Index LA diam:        3.10 cm 1.94 cm/m   RA Area:     13.80 cm LA Vol (A2C):   50.8 ml 31.76 ml/m  RA Volume:   35.00 ml  21.88 ml/m LA Vol (A4C):   52.4 ml 32.76 ml/m LA Biplane Vol: 52.8 ml 33.01 ml/m  AORTIC VALVE                    PULMONIC VALVE AV Area (Vmax):    2.12 cm     PV Vmax:       1.14 m/s AV Area (Vmean):   1.99 cm     PV Peak grad:  5.2 mmHg AV Area (VTI):     1.96 cm AV Vmax:           117.00 cm/s AV Vmean:          76.800 cm/s AV VTI:            0.256 m AV Peak Grad:      5.5 mmHg AV Mean Grad:      3.0 mmHg LVOT Vmax:  97.40 cm/s LVOT Vmean:        59.950 cm/s LVOT VTI:          0.198 m LVOT/AV VTI ratio: 0.77 AI PHT:            1309 msec  AORTA Ao Root diam: 3.60 cm MITRAL VALVE               TRICUSPID VALVE MV Area (PHT): 3.42 cm    TR Peak grad:    11.4 mmHg MV Decel Time: 222 msec    TR Vmax:        169.00 cm/s MR Peak grad: 70.9 mmHg MR Vmax:      421.00 cm/s  SHUNTS MV E velocity: 80.10 cm/s  Systemic VTI:  0.20 m MV A velocity: 61.00 cm/s  Systemic Diam: 1.80 cm MV E/A ratio:  1.31 Jenkins Rouge MD Electronically signed by Jenkins Rouge MD Signature Date/Time: 12/07/2022/2:54:12 PM    Final    DG Chest Port 1 View  Result Date: 12/07/2022 CLINICAL DATA:  77 year old female with history of shortness of breath. EXAM: PORTABLE CHEST 1 VIEW COMPARISON:  Chest x-ray 12/03/2022. FINDINGS: Lung volumes are normal. Worsening diffuse interstitial prominence, widespread peribronchial cuffing, and ill-defined airspace consolidation scattered throughout the lungs bilaterally, most severe throughout the mid to lower lungs (right greater than left). Probable trace bilateral pleural effusions. No pneumothorax. Pulmonary vasculature is largely obscured. Heart size appears borderline enlarged. Upper mediastinal contours are within normal limits. Atherosclerotic calcifications are noted in the thoracic aorta. IMPRESSION: 1. The appearance of the chest is most compatible with progressive severe multilobar bilateral pneumonia, most likely from atypical organisms such as a viral infection. 2. Trace bilateral pleural effusions. 3. Aortic atherosclerosis. Electronically Signed   By: Vinnie Langton M.D.   On: 12/07/2022 05:02    Pending Labs Unresulted Labs (From admission, onward)     Start     Ordered   12/07/22 1701  Metanephrines, plasma  Once,   R        12/07/22 1701   12/07/22 1646  Aldosterone + renin activity w/ ratio  Once,   R        12/07/22 1646            Vitals/Pain Today's Vitals   12/08/22 0600 12/08/22 0750 12/08/22 0829 12/08/22 1030  BP: 130/76 133/82    Pulse: 65 61  61  Resp: 15 15  20   Temp:   98.1 F (36.7 C)   TempSrc:   Oral   SpO2: 94% 97%  100%  PainSc:        Isolation Precautions Droplet  precaution  Medications Medications  furosemide (LASIX) injection 40 mg (40 mg Intravenous Given 12/07/22 1817)  sodium chloride flush (NS) 0.9 % injection 3 mL (3 mLs Intravenous Not Given 12/08/22 1120)  acetaminophen (TYLENOL) tablet 650 mg (650 mg Oral Given 12/07/22 1527)    Or  acetaminophen (TYLENOL) suppository 650 mg ( Rectal See Alternative 12/07/22 1527)  ondansetron (ZOFRAN) tablet 4 mg (has no administration in time range)    Or  ondansetron (ZOFRAN) injection 4 mg (has no administration in time range)  albuterol (PROVENTIL) (2.5 MG/3ML) 0.083% nebulizer solution 2.5 mg (has no administration in time range)  enoxaparin (LOVENOX) injection 40 mg (40 mg Subcutaneous Given 12/07/22 1816)  fluticasone (FLONASE) 50 MCG/ACT nasal spray 2 spray (2 sprays Each Nare Not Given 12/07/22 1830)  latanoprost (XALATAN) 0.005 % ophthalmic solution 1 drop (0 drops Both Eyes Hold  12/07/22 2106)  sacubitril-valsartan (ENTRESTO) 49-51 mg per tablet (0 tablets Oral Hold 12/08/22 1120)  HYDROcodone-acetaminophen (NORCO/VICODIN) 5-325 MG per tablet 1 tablet (has no administration in time range)  carvedilol (COREG) tablet 25 mg (0 mg Oral Hold 12/08/22 1120)  atorvastatin (LIPITOR) tablet 40 mg (has no administration in time range)  furosemide (LASIX) injection 80 mg (80 mg Intravenous Given 12/07/22 0458)  levofloxacin (LEVAQUIN) IVPB 750 mg (0 mg Intravenous Stopped 12/07/22 0709)  potassium chloride SA (KLOR-CON M) CR tablet 40 mEq (40 mEq Oral Given 12/07/22 1820)  gadobutrol (GADAVIST) 1 MMOL/ML injection 7 mL (7 mLs Intravenous Contrast Given 12/08/22 1000)    Mobility walks     Focused Assessments Pulmonary Assessment Handoff:  Lung sounds: Bilateral Breath Sounds: Diminished L Breath Sounds: Diminished R Breath Sounds: Diminished O2 Device: Room Air O2 Flow Rate (L/min): 3 L/min    R Recommendations: See Admitting Provider Note  Report given to:   Additional Notes: Pt went to MRI this  morning at 830 and came back was then taken to vascular for renal duplex study. She was NPO for this study so I held PO meds and was waiting to give her lasix when she was finished with her tests.

## 2022-12-08 NOTE — Assessment & Plan Note (Addendum)
Echocardiogram with preserved LV systolic function 60 to 55%, severe LVH, increased septal thickening, pericardial effusion and thickened valve leaflets. RV systolic function preserved.   Cardiac MRI with severe asymmetric septal hypertrophy but no evidence for LVOT gradient or mitral valve SAM.  Findings consistent with variant of hypertrophic cardiomyopathy.   Urine output 3,748 cc Systolic blood pressure 79 to 96 mmHg.   Medical therapy with carvedilol, empagliflozin, entresto and spironolactone.

## 2022-12-08 NOTE — Hospital Course (Addendum)
Brooke Gray was admitted to the hospital with the working diagnosis of decompensated heart failure.  77 yo female with the past medical history of hypertension, diastolic heart failure, and paroxysmal atrial fibrillation who presented with respiratory of distress. Patient reported worsening dyspnea and and lower extremity edema, along with orthopnea. Apparently she stopped taking furosemide at home. 4 days prior her admission, she was seen at the ED for acute pulmonary edema, placed on Bipap and treated with furosemide IV. She was advices to resume torsemide.  Because with respiratory distress she called EMS, her 02 was 52%, placed on Cpap and transferred to the ED. On her initial physical examination her blood pressure was 104/84, 197/113, HR 59, RR 22 and 02 saturation 94%, lungs with no rales or wheezing, heart with S1 and S2 present and rhythmic, abdomen with no distention, no lower extremity edema.   Na 140, K 4,2 CL 103 bicarbonate 26 glucose 185, bun 15 and cr 1,0 BNP 420  Wbc 7.8 hgb 13.4 plt 128  Sars COVID 19 negative Influenza negative   Chest radiograph with cardiomegaly with bilateral hilar vascular congestion, bilateral interstitial infiltrates, predominant at the lower lobes.   EKG 61 bpm, left axis, left anterior fascicular block, sinus rhythm with no significant ST segment or T wave changes.  Patient has been placed on IV furosemide for diuresis.  Blood pressure has improved.   01/27 volume status and blood pressure have improved.  Patient will continue furosemide as needed, follow up with cardiology as outpatient.

## 2022-12-08 NOTE — ED Notes (Signed)
Patient transported to MRI 

## 2022-12-08 NOTE — Assessment & Plan Note (Signed)
Cell count stable.  

## 2022-12-08 NOTE — Progress Notes (Signed)
Heart Failure Navigator Progress Note  Assessed for Heart & Vascular TOC clinic readiness.  Patient does not meet criteria due to Advanced Heart Failure Team consult. .   Navigator will sign off at this time.    Dawnna Gritz, BSN, RN Heart Failure Nurse Navigator Secure Chat Only   

## 2022-12-08 NOTE — TOC Benefit Eligibility Note (Signed)
Patient Teacher, English as a foreign language completed.    The patient is currently admitted and upon discharge could be taking Entresto 24-26 mg.  The current 30 day co-pay is $45.00.   The patient is currently admitted and upon discharge could be taking Jardiance 10 mg.  The current 30 day co-pay is $45.00.   The patient is insured through [place], [place] Patient Advocate Specialist [place] Patient Advocate Team Direct Number: [phone]  Fax: [phone]

## 2022-12-08 NOTE — Assessment & Plan Note (Addendum)
Continue rate control with carvedilol  Patient has been in sinus rhythm. Patient had TIA 2001 with a fib detection per holter monitor.   I spoke with her about secondary CVA prevention with apixaban. She will think about that and will follow up as outpatient. For no she would like to continue holding apixaban. Concerns are taking a bid daily medication, and risk of bleeding, plus medication cost.

## 2022-12-08 NOTE — Assessment & Plan Note (Signed)
Continue atorvastatin

## 2022-12-08 NOTE — Progress Notes (Signed)
  Progress Note   Patient: Brooke Gray XIP:[id number] DOB: 08-Nov-1946 DOA: 12/07/2022     0 DOS: the patient was seen and examined on 12/08/2022   Brief hospital course: Brooke Gray was admitted to the hospital with the working diagnosis of decompensated heart failure.  77 yo female with the past medical history of hypertension, diastolic heart failure, and paroxysmal atrial fibrillation who presented with respiratory of distress. Patient reported worsening dyspnea and and lower extremity edema, along with orthopnea. Apparently she stopped taking furosemide at home. 4 days prior her admission, she was seen at the ED for acute pulmonary edema, placed on Bipap and treated with furosemide IV. She was advices to resume torsemide.  Because with respiratory distress she called EMS, her 02 was 52%, placed on Cpap and transferred to the ED. On her initial physical examination her blood pressure was 104/84, 197/113, HR 59, RR 22 and 02 saturation 94%, lungs with no rales or wheezing, heart with S1 and S2 present and rhythmic, abdomen with no distention, no lower extremity edema.   Na 140, K 4,2 CL 103 bicarbonate 26 glucose 185, bun 15 and cr 1,0 BNP 420  Wbc 7.8 hgb 13.4 plt 128  Sars COVID 19 negative Influenza negative   Chest radiograph with cardiomegaly with bilateral hilar vascular congestion, bilateral interstitial infiltrates, predominant at the lower lobes.   EKG 61 bpm, left axis, left anterior fascicular block, sinus rhythm with no significant ST segment or T wave changes.  Patient has been placed on IV furosemide for diuresis.       Assessment and Plan: * Acute on chronic diastolic CHF (congestive heart failure) (HCC) Echocardiogram with preserved LV systolic function 60 to 67%, severe LVH, increased septal thickening, pericardial effusion and thickened valve leaflets. RV systolic function preserved.   Urine output 3,419 cc Systolic blood pressure 379 to 159 mmHg.   Continue  medical therapy with carvedilol, empagliflizin, and entresto. Spironolactone and furosemide.  Follow up on cardiac MRI.   Hypertensive urgency Blood pressure is improving, continue with carvedilol, entresto and diuresis with empagloflozin and furosemide.   Paroxysmal atrial fibrillation (HCC) Continue rate control with carvedilol  Follow up cardiology recommendations in regards anticoagulation.   Hyperlipemia Continue atorvastatin.   Thrombocytopenia (HCC) Cell count stable.         Subjective: Patient with improvement in her dyspnea, no chest pain,   Physical Exam: Vitals:   12/08/22 1431 12/08/22 1500 12/08/22 1630 12/08/22 1730  BP: (!) 134/97 138/73 128/81 137/80  Pulse: 66     Resp: 15 20 (!) 21 19  Temp: (!) 97.5 F (36.4 C)     TempSrc: Oral     SpO2: 97% 95% 96% 97%   Neurology awake and alert ENT with no pallor Cardiovascular with S1 and S2 present and rhythmic with no gallops Respiratory with no mild rales, no wheezing Abdomen with no distention No lower extremity edema  Data Reviewed:    Family Communication: no family at the bedside   Disposition: Status is: Inpatient Remains inpatient appropriate because: heart failure   Planned Discharge Destination: Home      Author: Tawni Millers, MD 12/08/2022 6:18 PM  For on call review [email or web address].

## 2022-12-08 NOTE — Assessment & Plan Note (Addendum)
Carvedilol, entresto and diuresis with empagloflozin and furosemide.

## 2022-12-08 NOTE — Progress Notes (Addendum)
Advanced Heart Failure Rounding Note  PCP-Cardiologist: None   Subjective:    Echo yesterday EF 60-65%. Severe LVH. RV normal, trivial MR, mild TR  Feels much better this morning. Denies CP or SOB. Able to lay flat.  Objective:   Weight Range:   There is no height or weight on file to calculate BMI.   Vital Signs:   Temp:  [98 F (36.7 C)-98.3 F (36.8 C)] 98.1 F (36.7 C) (01/25 0439) Pulse Rate:  [57-73] 61 (01/25 0750) Resp:  [12-25] 15 (01/25 0750) BP: (125-184)/(75-115) 133/82 (01/25 0750) SpO2:  [92 %-99 %] 97 % (01/25 0750) Last BM Date : 12/08/22  Weight change: There were no vitals filed for this visit.  Intake/Output:   Intake/Output Summary (Last 24 hours) at 12/08/2022 0811 Last data filed at 12/07/2022 0930 Gross per 24 hour  Intake --  Output 1200 ml  Net -1200 ml      Physical Exam    General:  well appearing.  No respiratory difficulty HEENT: normal Neck: supple. JVD ~8 cm. Carotids 2+ bilat; no bruits. No lymphadenopathy or thyromegaly appreciated. Cor: PMI nondisplaced. Regular rate & rhythm. No rubs, gallops or murmurs. Lungs: clear Abdomen: soft, nontender, nondistended. No hepatosplenomegaly. No bruits or masses. Good bowel sounds. Extremities: no cyanosis, clubbing, rash, edema  Neuro: alert & oriented x 3, cranial nerves grossly intact. moves all 4 extremities w/o difficulty. Affect pleasant.   Telemetry   NSR 60s. ~1 PVCs/hr. Intt PACs (Personally reviewed)    EKG    No new EKG to review  Labs    CBC Recent Labs    12/07/22 0344 12/08/22 0434  WBC 7.8 13.7*  HGB 13.4 12.4  HCT 38.9 35.7*  MCV 89.8 88.1  PLT 128* 146*   Basic Metabolic Panel Recent Labs    54/62/70 0344 12/08/22 0434  NA 140 137  K 4.2 4.4  CL 103 104  CO2 26 27  GLUCOSE 185* 123*  BUN 15 18  CREATININE 1.03* 0.94  CALCIUM 9.2 9.5   Liver Function Tests Recent Labs    12/07/22 0344  AST 60*  ALT 50*  ALKPHOS 64  BILITOT 0.6  PROT  6.7  ALBUMIN 3.8   No results for input(s): "LIPASE", "AMYLASE" in the last 72 hours. Cardiac Enzymes No results for input(s): "CKTOTAL", "CKMB", "CKMBINDEX", "TROPONINI" in the last 72 hours.  BNP: BNP (last 3 results) Recent Labs    12/03/22 0240 12/07/22 0344  BNP 704.0* 420.9*    ProBNP (last 3 results) No results for input(s): "PROBNP" in the last 8760 hours.   D-Dimer No results for input(s): "DDIMER" in the last 72 hours. Hemoglobin A1C No results for input(s): "HGBA1C" in the last 72 hours. Fasting Lipid Panel Recent Labs    12/07/22 1634  CHOL 225*  HDL 73  LDLCALC 143*  TRIG 46  CHOLHDL 3.1   Thyroid Function Tests Recent Labs    12/07/22 1634  TSH 0.319*    Other results:   Imaging    ECHOCARDIOGRAM COMPLETE  Result Date: 12/07/2022    ECHOCARDIOGRAM REPORT   Patient Name:   [facility] Date of Exam: 12/07/2022 Medical Rec #:  [id number]       Height:       63.0 in Accession #:    [id number]      Weight:       128.0 lb Date of Birth:  05/09/1946       BSA:  1.600 m Patient Age:    77 years        BP:           124/75 mmHg Patient Gender: F               HR:           61 bpm. Exam Location:  Inpatient Procedure: 2D Echo Indications:    CHF  History:        Patient has prior history of Echocardiogram examinations, most                 recent 11/23/2019. Hypertrophic Cardiomyopathy and CHF, TIA; Risk                 Factors:Hypertension and Dyslipidemia.  Sonographer:    Cathie Hoops Referring Phys: [id number] RONDELL A SMITH IMPRESSIONS  1. See prior recomendation regarding r/o Amyloid given increased septal thickness with specking, pericardial effusoin and thickened valve leaflets Would have patient return for Strain imaging TDI velocities above 10 would argue against amyloid . Left ventricular ejection fraction, by estimation, is 60 to 65%. The left ventricle has normal function. The left ventricle has no regional wall motion abnormalities. There is  severe left ventricular hypertrophy. Left ventricular diastolic parameters were normal.  2. Right ventricular systolic function is normal. The right ventricular size is normal. There is normal pulmonary artery systolic pressure.  3. The mitral valve is abnormal. Trivial mitral valve regurgitation. No evidence of mitral stenosis.  4. The aortic valve is tricuspid. Aortic valve regurgitation is not visualized. No aortic stenosis is present.  5. The inferior vena cava is normal in size with greater than 50% respiratory variability, suggesting right atrial pressure of 3 mmHg. FINDINGS  Left Ventricle: See prior recomendation regarding r/o Amyloid given increased septal thickness with specking, pericardial effusoin and thickened valve leaflets Would have patient return for Strain imaging TDI velocities above 10 would argue against amyloid. Left ventricular ejection fraction, by estimation, is 60 to 65%. The left ventricle has normal function. The left ventricle has no regional wall motion abnormalities. The left ventricular internal cavity size was normal in size. There is severe left ventricular hypertrophy. Left ventricular diastolic parameters were normal. Right Ventricle: The right ventricular size is normal. No increase in right ventricular wall thickness. Right ventricular systolic function is normal. There is normal pulmonary artery systolic pressure. The tricuspid regurgitant velocity is 1.69 m/s, and  with an assumed right atrial pressure of 3 mmHg, the estimated right ventricular systolic pressure is 14.4 mmHg. Left Atrium: Left atrial size was normal in size. Right Atrium: Right atrial size was normal in size. Pericardium: Trivial pericardial effusion is present. The pericardial effusion is posterior to the left ventricle. Mitral Valve: The mitral valve is abnormal. There is mild thickening of the mitral valve leaflet(s). Trivial mitral valve regurgitation. No evidence of mitral valve stenosis. Tricuspid  Valve: The tricuspid valve is normal in structure. Tricuspid valve regurgitation is mild . No evidence of tricuspid stenosis. Aortic Valve: The aortic valve is tricuspid. Aortic valve regurgitation is not visualized. Aortic regurgitation PHT measures 1309 msec. No aortic stenosis is present. Aortic valve mean gradient measures 3.0 mmHg. Aortic valve peak gradient measures 5.5 mmHg. Aortic valve area, by VTI measures 1.96 cm. Pulmonic Valve: The pulmonic valve was normal in structure. Pulmonic valve regurgitation is not visualized. No evidence of pulmonic stenosis. Aorta: The aortic root is normal in size and structure. Venous: The inferior vena cava is normal in  size with greater than 50% respiratory variability, suggesting right atrial pressure of 3 mmHg. IAS/Shunts: No atrial level shunt detected by color flow Doppler.  LEFT VENTRICLE PLAX 2D LVIDd:         3.10 cm     Diastology LVIDs:         2.10 cm     LV e' medial:    4.68 cm/s LV PW:         1.30 cm     LV E/e' medial:  17.1 LV IVS:        1.70 cm     LV e' lateral:   6.42 cm/s LVOT diam:     1.80 cm     LV E/e' lateral: 12.5 LV SV:         50 LV SV Index:   31 LVOT Area:     2.54 cm                             3D Volume EF: LV Volumes (MOD)           3D EF:        59 % LV vol d, MOD A2C: 74.7 ml LV EDV:       90 ml LV vol d, MOD A4C: 88.9 ml LV ESV:       37 ml LV vol s, MOD A2C: 37.8 ml LV SV:        53 ml LV vol s, MOD A4C: 36.6 ml LV SV MOD A2C:     36.9 ml LV SV MOD A4C:     88.9 ml LV SV MOD BP:      46.2 ml RIGHT VENTRICLE RV Basal diam:  3.10 cm RV Mid diam:    2.40 cm RV S prime:     18.30 cm/s TAPSE (M-mode): 1.6 cm LEFT ATRIUM             Index        RIGHT ATRIUM           Index LA diam:        3.10 cm 1.94 cm/m   RA Area:     13.80 cm LA Vol (A2C):   50.8 ml 31.76 ml/m  RA Volume:   35.00 ml  21.88 ml/m LA Vol (A4C):   52.4 ml 32.76 ml/m LA Biplane Vol: 52.8 ml 33.01 ml/m  AORTIC VALVE                    PULMONIC VALVE AV Area (Vmax):     2.12 cm     PV Vmax:       1.14 m/s AV Area (Vmean):   1.99 cm     PV Peak grad:  5.2 mmHg AV Area (VTI):     1.96 cm AV Vmax:           117.00 cm/s AV Vmean:          76.800 cm/s AV VTI:            0.256 m AV Peak Grad:      5.5 mmHg AV Mean Grad:      3.0 mmHg LVOT Vmax:         97.40 cm/s LVOT Vmean:        59.950 cm/s LVOT VTI:          0.198 m LVOT/AV VTI ratio: 0.77 AI PHT:  1309 msec  AORTA Ao Root diam: 3.60 cm MITRAL VALVE               TRICUSPID VALVE MV Area (PHT): 3.42 cm    TR Peak grad:   11.4 mmHg MV Decel Time: 222 msec    TR Vmax:        169.00 cm/s MR Peak grad: 70.9 mmHg MR Vmax:      421.00 cm/s  SHUNTS MV E velocity: 80.10 cm/s  Systemic VTI:  0.20 m MV A velocity: 61.00 cm/s  Systemic Diam: 1.80 cm MV E/A ratio:  1.31 Jenkins Rouge MD Electronically signed by Jenkins Rouge MD Signature Date/Time: 12/07/2022/2:54:12 PM    Final      Medications:     Scheduled Medications:  carvedilol  25 mg Oral BID   enoxaparin (LOVENOX) injection  40 mg Subcutaneous Q24H   fluticasone  2 spray Each Nare Daily   furosemide  40 mg Intravenous BID   latanoprost  1 drop Both Eyes QHS   sacubitril-valsartan  1 tablet Oral BID   sodium chloride flush  3 mL Intravenous Q12H    Infusions:   PRN Medications: acetaminophen **OR** acetaminophen, albuterol, HYDROcodone-acetaminophen, ondansetron **OR** ondansetron (ZOFRAN) IV    Patient Profile   77 y/o female w/ h/o poorly controlled HTN, chronic diastolic dysfunction w/ severe LVH by echocardiogram, TIA, PAF on longer on a/c, bradycardia, HLD, prior tobacco use and ascending aortic aneurysm, admitted for acute hypoxic respiratory failure in setting of acute CHF/ flash pulmonary edema and hypertensive emergency.    Assessment/Plan   1. Acute CHF/Flash Pulmonary Edema - h/o diastolic heart failure, Echo 2021 EF 60-65%, severe LVH w/ mild speckled appearance - multiple presentations for flash pulmonary edema in the last wk  - BP  improved w/ NGT gtt>>transition to GDMT - continue IV Lasix 40 mg bid  - Echo yesterday EF 60-65%. Severe LVH. RV normal, trivial MR, mild TR - suspect hypertensive CM, though cannot r/o infiltrative CM (prior echo w/ speckled myocardium). cMRI ordered - Continue Entresto 49-51mg  bid (starting today) - Start spiro 12.5 - Start Jardiance 10 mg daily - continue home Coreg  - may need eventual Bidil    2. Hypertension / Hypertensive Emergency  - 197/113 in ED >>improved w/ NTG gtt, now weaned off - long h/o poor control, despite reported med compliance - multiple presentations for flash pulmonary edema - check renal artery dopplers to r/o RAS - R/o other secondary causes, check serum metanepherines, rena:aldosterone ratio  - GDMT titration per above - reports h/o snoring, will need outpatient sleep study    3. PAF - h/o TIA in 2001 w/ afib detection on Holter monitor  - in NSR on EKG on admission, remains in NSR  - recommend restarting a/c for secondary prevention  - continue Coreg for rate control    4. ? PNA - CXR suggestive of possible PNA, PCT 0.21. Respiratory panel negative  - got dose of levaquin yesterday   5. Elevated Hs Trop  - 320, denies CP. Suspect demand ischemia from hypertensive urgency/ CHF  - EF stable on yesterdays echo   6. AKI  - b/l SCr ~0.8 - 1.03 on admit. .94 today - improving with diuresis  7. Hyperlipidemia - LDL 143, continue statin  Length of Stay: [place], NP  12/08/2022, 8:11 AM  Advanced Heart Failure Team Pager [phone] (M-F; 7a - 5p)  Please contact [facility] for night-coverage after hours (5p -7a )  and weekends on [email or web address]   Patient seen and examined with the above-signed Advanced Practice Provider and/or Housestaff. I personally reviewed laboratory data, imaging studies and relevant notes. I independently examined the patient and formulated the important aspects of the plan. I have edited the note to reflect any of my  changes or salient points. I have personally discussed the plan with the patient and/or family.  Has diuresed well. Feeling much better. No further orthopnea or PND. BP still high  Renal artery u/s RAS > 60% on right  General:  Well appearing. No resp difficulty HEENT: normal Neck: supple. no JVD. Carotids 2+ bilat; no bruits. No lymphadenopathy or thryomegaly appreciated. Cor: PMI nondisplaced. Regular rate & rhythm. No rubs, gallops or murmurs. Lungs: clear Abdomen: soft, nontender, nondistended. No hepatosplenomegaly. No bruits or masses. Good bowel sounds. Extremities: no cyanosis, clubbing, rash, edema Neuro: alert & orientedx3, cranial nerves grossly intact. moves all 4 extremities w/o difficulty. Affect pleasant  She has evidence of longstanding renovascular HTN. Long discussion about pathophysiology and treatment for unilateral RAS.  Increase Entresto to 97/103. Add spiro and SGLT2i.   cMRI results pending.   Suspect elevated hstrop due to hypertensive strain. Can consider outpatient coronary CT  Remain in house until BP with improved control.   Glori Bickers, MD  2:03 PM

## 2022-12-08 NOTE — ED Notes (Signed)
Patient transported to Ultrasound 

## 2022-12-09 ENCOUNTER — Other Ambulatory Visit (HOSPITAL_COMMUNITY): Payer: Self-pay | Admitting: Internal Medicine

## 2022-12-09 ENCOUNTER — Other Ambulatory Visit (HOSPITAL_COMMUNITY): Payer: Self-pay

## 2022-12-09 ENCOUNTER — Inpatient Hospital Stay (HOSPITAL_BASED_OUTPATIENT_CLINIC_OR_DEPARTMENT_OTHER)
Admit: 2022-12-09 | Discharge: 2022-12-09 | Disposition: A | Discharge status: Home / Self Care | Payer: Medicare HMO | Attending: Internal Medicine | Admitting: Internal Medicine

## 2022-12-09 DIAGNOSIS — I5033 Acute on chronic diastolic (congestive) heart failure: Secondary | ICD-10-CM | POA: Diagnosis not present

## 2022-12-09 DIAGNOSIS — I421 Obstructive hypertrophic cardiomyopathy: Secondary | ICD-10-CM

## 2022-12-09 DIAGNOSIS — I160 Hypertensive urgency: Secondary | ICD-10-CM | POA: Diagnosis not present

## 2022-12-09 DIAGNOSIS — E785 Hyperlipidemia, unspecified: Secondary | ICD-10-CM | POA: Diagnosis not present

## 2022-12-09 DIAGNOSIS — I480 Paroxysmal atrial fibrillation: Secondary | ICD-10-CM | POA: Diagnosis not present

## 2022-12-09 LAB — BASIC METABOLIC PANEL
Anion gap: 10 (ref 5–15)
BUN: 24 mg/dL — ABNORMAL HIGH (ref 8–23)
CO2: 27 mmol/L (ref 22–32)
Calcium: 9.4 mg/dL (ref 8.9–10.3)
Chloride: 99 mmol/L (ref 98–111)
Creatinine, Ser: 0.91 mg/dL (ref 0.44–1.00)
GFR, Estimated: >60 mL/min (ref >60)
Glucose, Bld: 110 mg/dL — ABNORMAL HIGH (ref 70–99)
Potassium: 3.8 mmol/L (ref 3.5–5.1)
Sodium: 136 mmol/L (ref 135–145)

## 2022-12-09 MED ORDER — SACUBITRIL-VALSARTAN 24-26 MG PO TABS
1.0000 | ORAL_TABLET | Freq: Two times a day (BID) | ORAL | Status: DC
  Administered 2022-12-09 – 2022-12-10 (×2): 1 via ORAL
  Filled 2022-12-09 (×2): qty 1

## 2022-12-09 MED ORDER — CARVEDILOL 12.5 MG PO TABS
12.5000 mg | ORAL_TABLET | Freq: Two times a day (BID) | ORAL | Status: DC
  Administered 2022-12-09 – 2022-12-10 (×2): 12.5 mg via ORAL
  Filled 2022-12-09 (×2): qty 1

## 2022-12-09 NOTE — Progress Notes (Signed)
Mobility Specialist - Progress Note   12/09/22 1028  Mobility  Activity Ambulated with assistance in room  Level of Assistance Standby assist, set-up cues, supervision of patient - no hands on  Assistive Device None  Distance Ambulated (ft) 20 ft  Activity Response Tolerated well  Mobility Referral No  $Mobility charge 1 Mobility   Pt was received in bed and agreeable to session. No complaints throughout. Pt was returned to bed with all needs met.  Franki Monte  Mobility Specialist Please contact via Solicitor or Rehab office at [phone]

## 2022-12-09 NOTE — Progress Notes (Signed)
  Progress Note   Patient: Brooke Gray ZHY:[id number] DOB: 07/14/1946 DOA: 12/07/2022     1 DOS: the patient was seen and examined on 12/09/2022   Brief hospital course: Brooke Gray was admitted to the hospital with the working diagnosis of decompensated heart failure.  77 yo female with the past medical history of hypertension, diastolic heart failure, and paroxysmal atrial fibrillation who presented with respiratory of distress. Patient reported worsening dyspnea and and lower extremity edema, along with orthopnea. Apparently she stopped taking furosemide at home. 4 days prior her admission, she was seen at the ED for acute pulmonary edema, placed on Bipap and treated with furosemide IV. She was advices to resume torsemide.  Because with respiratory distress she called EMS, her 02 was 52%, placed on Cpap and transferred to the ED. On her initial physical examination her blood pressure was 104/84, 197/113, HR 59, RR 22 and 02 saturation 94%, lungs with no rales or wheezing, heart with S1 and S2 present and rhythmic, abdomen with no distention, no lower extremity edema.   Na 140, K 4,2 CL 103 bicarbonate 26 glucose 185, bun 15 and cr 1,0 BNP 420  Wbc 7.8 hgb 13.4 plt 128  Sars COVID 19 negative Influenza negative   Chest radiograph with cardiomegaly with bilateral hilar vascular congestion, bilateral interstitial infiltrates, predominant at the lower lobes.   EKG 61 bpm, left axis, left anterior fascicular block, sinus rhythm with no significant ST segment or T wave changes.  Patient has been placed on IV furosemide for diuresis.  Blood pressure has improved.     Assessment and Plan: * Acute on chronic diastolic CHF (congestive heart failure) (HCC) Echocardiogram with preserved LV systolic function 60 to 29%, severe LVH, increased septal thickening, pericardial effusion and thickened valve leaflets. RV systolic function preserved.   Cardiac MRI with severe asymmetric septal  hypertrophy but no evidence for LVOT gradient or mitral valve SAM.  Findings consistent with variant of hypertrophic cardiomyopathy.   Urine output 5,284 cc Systolic blood pressure 79 to 96 mmHg.   Medical therapy with carvedilol, empagliflozin, entresto and spironolactone.   Hypertensive urgency Carvedilol, entresto and diuresis with empagloflozin and furosemide.   Paroxysmal atrial fibrillation (HCC) Continue rate control with carvedilol  Follow up cardiology recommendations in regards anticoagulation.   Hyperlipemia Continue atorvastatin.   Thrombocytopenia (HCC) Cell count stable.         Subjective: Patient is feeling better, no dyspnea, her blood pressure has been low today.   Physical Exam: Vitals:   12/09/22 0500 12/09/22 0809 12/09/22 1208 12/09/22 1601  BP: 105/67 (!) 86/68 (!) 87/65 96/65  Pulse:  61 (!) 57 (!) 57  Resp: 15 14 20 17   Temp:  97.8 F (36.6 C) 98.2 F (36.8 C) 97.9 F (36.6 C)  TempSrc:  Oral Oral Oral  SpO2: 98% 96% 96% 96%  Weight:       Neurology awake and alert ENT With mild pallor Cardiovascular with S1 and S2 present and rhythmic with no gallops or murmurs Respiratory with no rales or wheezing Abdomen with no distention  No lower extremity edema  Data Reviewed:    Family Communication: no family at the bedside   Disposition: Status is: Inpatient Remains inpatient appropriate because: hypotension   Planned Discharge Destination: Home     Author: Tawni Millers, MD 12/09/2022 5:42 PM  For on call review [email or web address].

## 2022-12-09 NOTE — Progress Notes (Signed)
Pt woke up and complains of nausea. Pt denies chest pain and not in respiratory distress. Pt refused to take Zofran IV as prn order. Dr. Velia Meyer was notified. Will continue to monitor pt.    12/09/22 0129  Assess: MEWS Score  Temp 98.1 F (36.7 C)  BP 90/66  MAP (mmHg) 75  ECG Heart Rate (!) 57  Resp 12  SpO2 97 %  O2 Device Room Air  Assess: MEWS Score  MEWS Temp 0  MEWS Systolic 1  MEWS Pulse 0  MEWS RR 1  MEWS LOC 0  MEWS Score 2  MEWS Score Color Yellow  Treat  MEWS Interventions Escalated (See documentation below)  Pain Scale 0-10  Pain Score 0  Escalate  MEWS: Escalate Yellow: discuss with charge nurse/RN and consider discussing with provider and RRT  Notify: Charge Nurse/RN  Name of Charge Nurse/RN Notified Neoma Laming  Date Charge Nurse/RN Notified 12/09/22  Time Charge Nurse/RN Notified 0126  Provider Notification  Provider Name/Title Dr. Velia Meyer  Date Provider Notified 12/09/22  Method of Notification Page  Notification Reason Other (Comment) (low BP)  Document  Patient Outcome Not stable and remains on department  Progress note created (see row info) Yes  Assess: SIRS CRITERIA  SIRS Temperature  0  SIRS Pulse 0  SIRS Respirations  0  SIRS WBC 0  SIRS Score Sum  0

## 2022-12-09 NOTE — Progress Notes (Addendum)
Advanced Heart Failure Rounding Note  PCP-Cardiologist: None   Subjective:    Echo yesterday EF 60-65%. Severe LVH. RV normal, trivial MR, mild TR  BP on the softer side this morning. MAPS in 76s.   Feels good this morning. Slightly dizzy. Denies CP/SOB  cMRI yesterday showed: variant of hypertrophic cardiomyopathy EF 54% RV 55%  Objective:   Weight Range: 58.7 kg Body mass index is 22.92 kg/m.   Vital Signs:   Temp:  [97.5 F (36.4 C)-98.2 F (36.8 C)] 97.8 F (36.6 C) (01/26 0809) Pulse Rate:  [56-66] 61 (01/26 0809) Resp:  [12-21] 14 (01/26 0809) BP: (84-164)/(66-110) 86/68 (01/26 0809) SpO2:  [92 %-100 %] 96 % (01/26 0809) Weight:  [58.7 kg] 58.7 kg (01/26 0434) Last BM Date : 12/08/22  Weight change: Filed Weights   12/09/22 0434  Weight: 58.7 kg    Intake/Output:   Intake/Output Summary (Last 24 hours) at 12/09/2022 0911 Last data filed at 12/08/2022 2223 Gross per 24 hour  Intake 756.55 ml  Output 1650 ml  Net -893.45 ml      Physical Exam    General:  well appearing.  No respiratory difficulty HEENT: normal Neck: supple. No JVD. Carotids 2+ bilat; no bruits. No lymphadenopathy or thyromegaly appreciated. Cor: PMI nondisplaced. Regular rate & rhythm. No rubs, gallops or murmurs. Lungs: clear Abdomen: soft, nontender, nondistended. No hepatosplenomegaly. No bruits or masses. Good bowel sounds. Extremities: no cyanosis, clubbing, rash, edema  Neuro: alert & oriented x 3, cranial nerves grossly intact. moves all 4 extremities w/o difficulty. Affect pleasant.   Telemetry   NSR 60s. Intt PACs (Personally reviewed)    EKG    No new EKG to review  Labs    CBC Recent Labs    12/07/22 0344 12/08/22 0434  WBC 7.8 13.7*  HGB 13.4 12.4  HCT 38.9 35.7*  MCV 89.8 88.1  PLT 128* 123456*   Basic Metabolic Panel Recent Labs    12/08/22 0434 12/09/22 0327  NA 137 136  K 4.4 3.8  CL 104 99  CO2 27 27  GLUCOSE 123* 110*  BUN 18 24*   CREATININE 0.94 0.91  CALCIUM 9.5 9.4   Liver Function Tests Recent Labs    12/07/22 0344  AST 60*  ALT 50*  ALKPHOS 64  BILITOT 0.6  PROT 6.7  ALBUMIN 3.8   No results for input(s): "LIPASE", "AMYLASE" in the last 72 hours. Cardiac Enzymes No results for input(s): "CKTOTAL", "CKMB", "CKMBINDEX", "TROPONINI" in the last 72 hours.  BNP: BNP (last 3 results) Recent Labs    12/03/22 0240 12/07/22 0344  BNP 704.0* 420.9*    ProBNP (last 3 results) No results for input(s): "PROBNP" in the last 8760 hours.   D-Dimer No results for input(s): "DDIMER" in the last 72 hours. Hemoglobin A1C No results for input(s): "HGBA1C" in the last 72 hours. Fasting Lipid Panel Recent Labs    12/07/22 1634  CHOL 225*  HDL 73  LDLCALC 143*  TRIG 46  CHOLHDL 3.1   Thyroid Function Tests Recent Labs    12/07/22 1634  TSH 0.319*    Other results:   Imaging    VAS US RENAL ARTERY DUPLEX  Result Date: 12/08/2022 ABDOMINAL VISCERAL Patient Name:  [facility]  Date of Exam:   12/08/2022 Medical Rec #: [id number]        Accession #:    [id number] Date of Birth: 1946-01-21        Patient Gender: F  Patient Age:   89 years Exam Location:  [facility] Procedure:      VAS US RENAL ARTERY DUPLEX Referring Phys: [id number] BRITTAINY M SIMMONS -------------------------------------------------------------------------------- Indications: Resistant hypertension Limitations: Air/bowel gas. Comparison Study: No prior studies. Performing Technologist: Jean Rosenthal RDMS, RVT  Examination Guidelines: A complete evaluation includes B-mode imaging, spectral Doppler, color Doppler, and power Doppler as needed of all accessible portions of each vessel. Bilateral testing is considered an integral part of a complete examination. Limited examinations for reoccurring indications may be performed as noted.  Duplex Findings: +----------------------+--------+--------+------+--------+ Mesenteric            PSV  cm/sEDV cm/sPlaqueComments +----------------------+--------+--------+------+--------+ Aorta Mid                52                          +----------------------+--------+--------+------+--------+ Celiac Artery Proximal  132                          +----------------------+--------+--------+------+--------+ SMA Proximal            247                          +----------------------+--------+--------+------+--------+    +------------------+--------+--------+-------+ Right Renal ArteryPSV cm/sEDV cm/sComment +------------------+--------+--------+-------+ Origin              325      79           +------------------+--------+--------+-------+ Proximal            167      32           +------------------+--------+--------+-------+ Mid                 146      35           +------------------+--------+--------+-------+ Distal               60      19           +------------------+--------+--------+-------+ +-----------------+--------+--------+-------+ Left Renal ArteryPSV cm/sEDV cm/sComment +-----------------+--------+--------+-------+ Origin              74      19           +-----------------+--------+--------+-------+ Proximal            92      29           +-----------------+--------+--------+-------+ Mid                107      31           +-----------------+--------+--------+-------+ Distal             103      34           +-----------------+--------+--------+-------+ +------------+--------+--------+----+-----------+--------+--------+----+ Right KidneyPSV cm/sEDV cm/sRI  Left KidneyPSV cm/sEDV cm/sRI   +------------+--------+--------+----+-----------+--------+--------+----+ Upper Pole  10      4       0.61Upper Pole 33      9       0.73 +------------+--------+--------+----+-----------+--------+--------+----+ Mid         30      9       0.        29      11      0.64  +------------+--------+--------+----+-----------+--------+--------+----+ Lower Pole  39  11      0.72Lower Pole 31      11      0.63 +------------+--------+--------+----+-----------+--------+--------+----+ Hilar       30      11      0.63Hilar      30      11      0.64 +------------+--------+--------+----+-----------+--------+--------+----+ +------------------+-----+------------------+-----+ Right Kidney           Left Kidney             +------------------+-----+------------------+-----+ RAR                    RAR                     +------------------+-----+------------------+-----+ RAR (manual)      6.25 RAR (manual)      2.06  +------------------+-----+------------------+-----+ Cortex                 Cortex                  +------------------+-----+------------------+-----+ Cortex thickness       Corex thickness         +------------------+-----+------------------+-----+ Kidney length (cm)10.40Kidney length (cm)10.96 +------------------+-----+------------------+-----+  Summary: Renal:  Right: Evidence of a > 60% stenosis at the origin of the right renal        artery. Abnormal right Resistive Index. RRV flow present.        Normal size right kidney. Left:  No evidence of left renal artery stenosis. Normal left        Resistive Index. LRV flow present. Normal size of left        kidney. Mesenteric: Normal Celiac artery and Superior Mesenteric artery findings.  *See table(s) above for measurements and observations.  Diagnosing physician: Monica Martinez MD  Electronically signed by Monica Martinez MD on 12/08/2022 at 4:50:57 PM.    Final    MR CARDIAC VELOCITY FLOW MAP  Result Date: 12/08/2022 CLINICAL DATA:  Assess for cardiac amyloidosis. EXAM: CARDIAC MRI TECHNIQUE: The patient was scanned on a 1.5 Tesla GE magnet. A dedicated cardiac coil was used. Functional imaging was done using Fiesta sequences. 2,3, and 4 chamber views were done to assess for  RWMA's. Modified Simpson's rule using a short axis stack was used to calculate an ejection fraction on a dedicated work Conservation officer, nature. The patient received 10 cc of Gadavist. After 10 minutes inversion recovery sequences were used to assess for infiltration and scar tissue. CONTRAST:  Gadavist 10 cc FINDINGS: Limited images of the lung fields showed no gross abnormalities. Trivial pericardial effusion. Normal left ventricular size. Severe asymmetric septal hypertrophy (18 mm basal septum, 8 mm basal inferolateral wall). No turbulence to suggest LV outflow tract gradient and no mitral valve systolic anterior motion. Low normal systolic function, EF 44%. Normal right ventricular size and systolic function, EF 01%. Normal right atrial size. Moderate left atrial enlargement. Trileaflet aortic valve, no significant stenosis or regurgitation. Mild mitral regurgitation, regurgitant fraction 9%. Delayed enhancement imaging: Extensive mid-wall late gadolinium enhancement involving the entire septum, the mid-apical anterior wall, mid-apical inferior wall, and mid-apical lateral wall. MEASUREMENTS: MEASUREMENTS LVEDV 113 mL LVEDVi 70 mL/m2 LVSV 61 mL LVEF 54% RVEDV 82 mL RVEDVi 51 mL/m2 RVSV 45 mL RVEF 55% Aortic forward volume 56 mL T1 1079, ECV 34% IMPRESSION: 1. Low normal LV systolic function, EF 02%. There was severe asymmetric septal hypertrophy but no evidence for LVOT gradient or mitral valve SAM.  2.  Normal RV size and systolic function, EF [id number]. 3.  Extensive noncoronary pattern mid-wall LGE. 4. Extracellular volume percentage mildly elevated at 34% suggesting increased myocardial fibrosis. Findings on this study seem most characteristic of a variant of hypertrophic cardiomyopathy. Cannot fully rule out cardiac amyloidosis which could also present similarly to this, however ECV percentage < 40% is less suggestive. Would consider gene testing for HCM and TTR, also PYP scan. Dalton Mclean Electronically  Signed   By: Loralie Champagne M.D.   On: 12/08/2022 14:13   MR CARDIAC VELOCITY FLOW MAP  Result Date: 12/08/2022 CLINICAL DATA:  Assess for cardiac amyloidosis. EXAM: CARDIAC MRI TECHNIQUE: The patient was scanned on a 1.5 Tesla GE magnet. A dedicated cardiac coil was used. Functional imaging was done using Fiesta sequences. 2,3, and 4 chamber views were done to assess for RWMA's. Modified Simpson's rule using a short axis stack was used to calculate an ejection fraction on a dedicated work Conservation officer, nature. The patient received 10 cc of Gadavist. After 10 minutes inversion recovery sequences were used to assess for infiltration and scar tissue. CONTRAST:  Gadavist 10 cc FINDINGS: Limited images of the lung fields showed no gross abnormalities. Trivial pericardial effusion. Normal left ventricular size. Severe asymmetric septal hypertrophy (18 mm basal septum, 8 mm basal inferolateral wall). No turbulence to suggest LV outflow tract gradient and no mitral valve systolic anterior motion. Low normal systolic function, EF 123XX123. Normal right ventricular size and systolic function, EF [id number]. Normal right atrial size. Moderate left atrial enlargement. Trileaflet aortic valve, no significant stenosis or regurgitation. Mild mitral regurgitation, regurgitant fraction 9%. Delayed enhancement imaging: Extensive mid-wall late gadolinium enhancement involving the entire septum, the mid-apical anterior wall, mid-apical inferior wall, and mid-apical lateral wall. MEASUREMENTS: MEASUREMENTS LVEDV 113 mL LVEDVi 70 mL/m2 LVSV 61 mL LVEF 54% RVEDV 82 mL RVEDVi 51 mL/m2 RVSV 45 mL RVEF 55% Aortic forward volume 56 mL T1 1079, ECV 34% IMPRESSION: 1. Low normal LV systolic function, EF 123XX123. There was severe asymmetric septal hypertrophy but no evidence for LVOT gradient or mitral valve SAM. 2.  Normal RV size and systolic function, EF [id number]. 3.  Extensive noncoronary pattern mid-wall LGE. 4. Extracellular volume percentage  mildly elevated at 34% suggesting increased myocardial fibrosis. Findings on this study seem most characteristic of a variant of hypertrophic cardiomyopathy. Cannot fully rule out cardiac amyloidosis which could also present similarly to this, however ECV percentage < 40% is less suggestive. Would consider gene testing for HCM and TTR, also PYP scan. Dalton Mclean Electronically Signed   By: Loralie Champagne M.D.   On: 12/08/2022 14:12   MR CARDIAC MORPHOLOGY W WO CONTRAST  Result Date: 12/08/2022 CLINICAL DATA:  Assess for cardiac amyloidosis. EXAM: CARDIAC MRI TECHNIQUE: The patient was scanned on a 1.5 Tesla GE magnet. A dedicated cardiac coil was used. Functional imaging was done using Fiesta sequences. 2,3, and 4 chamber views were done to assess for RWMA's. Modified Simpson's rule using a short axis stack was used to calculate an ejection fraction on a dedicated work Conservation officer, nature. The patient received 10 cc of Gadavist. After 10 minutes inversion recovery sequences were used to assess for infiltration and scar tissue. CONTRAST:  Gadavist 10 cc FINDINGS: Limited images of the lung fields showed no gross abnormalities. Trivial pericardial effusion. Normal left ventricular size. Severe asymmetric septal hypertrophy (18 mm basal septum, 8 mm basal inferolateral wall). No turbulence to suggest LV outflow tract gradient and no  mitral valve systolic anterior motion. Low normal systolic function, EF 35%. Normal right ventricular size and systolic function, EF 32%. Normal right atrial size. Moderate left atrial enlargement. Trileaflet aortic valve, no significant stenosis or regurgitation. Mild mitral regurgitation, regurgitant fraction 9%. Delayed enhancement imaging: Extensive mid-wall late gadolinium enhancement involving the entire septum, the mid-apical anterior wall, mid-apical inferior wall, and mid-apical lateral wall. MEASUREMENTS: MEASUREMENTS LVEDV 113 mL LVEDVi 70 mL/m2 LVSV 61 mL LVEF  54% RVEDV 82 mL RVEDVi 51 mL/m2 RVSV 45 mL RVEF 55% Aortic forward volume 56 mL T1 1079, ECV 34% IMPRESSION: 1. Low normal LV systolic function, EF 99%. There was severe asymmetric septal hypertrophy but no evidence for LVOT gradient or mitral valve SAM. 2.  Normal RV size and systolic function, EF 24%. 3.  Extensive noncoronary pattern mid-wall LGE. 4. Extracellular volume percentage mildly elevated at 34% suggesting increased myocardial fibrosis. Findings on this study seem most characteristic of a variant of hypertrophic cardiomyopathy. Cannot fully rule out cardiac amyloidosis which could also present similarly to this, however ECV percentage < 40% is less suggestive. Would consider gene testing for HCM and TTR, also PYP scan. Dalton Mclean Electronically Signed   By: Loralie Champagne M.D.   On: 12/08/2022 14:12     Medications:     Scheduled Medications:  atorvastatin  40 mg Oral QPM   carvedilol  25 mg Oral BID   empagliflozin  10 mg Oral Daily   enoxaparin (LOVENOX) injection  40 mg Subcutaneous Q24H   fluticasone  2 spray Each Nare Daily   furosemide  40 mg Intravenous BID   latanoprost  1 drop Both Eyes QHS   sacubitril-valsartan  1 tablet Oral BID   sodium chloride flush  3 mL Intravenous Q12H   spironolactone  12.5 mg Oral Daily    Infusions:   PRN Medications: acetaminophen **OR** acetaminophen, albuterol, HYDROcodone-acetaminophen, ondansetron **OR** ondansetron (ZOFRAN) IV    Patient Profile   77 y/o female w/ h/o poorly controlled HTN, chronic diastolic dysfunction w/ severe LVH by echocardiogram, TIA, PAF on longer on a/c, bradycardia, HLD, prior tobacco use and ascending aortic aneurysm, admitted for acute hypoxic respiratory failure in setting of acute CHF/ flash pulmonary edema and hypertensive emergency.    Assessment/Plan   1. Acute CHF/Flash Pulmonary Edema - h/o diastolic heart failure, Echo 2021 EF 60-65%, severe LVH w/ mild speckled appearance - multiple  presentations for flash pulmonary edema in the last wk  - BP improved w/ NGT gtt>>transition to GDMT - stop IV lasix. Don't think she will need additional diuretic as we have added new GDMT - Echo yesterday EF 60-65%. Severe LVH. RV normal, trivial MR, mild TR - suspect hypertensive CM, though cannot r/o infiltrative CM (prior echo w/ speckled myocardium).  - cMRI yesterday showed: variant of hypertrophic cardiomyopathy EF 54% RV 55%, will order genetic testing - Cut back Entresto 24-26 to start this evening - Continue spiro 12.5 - Continue Jardiance 10 mg daily - Decrease coreg 25>12.5 BID - may need eventual Bidil  - Will place zio today (now placed over the weekend)   2. Hypertension / Hypertensive Emergency  - 197/113 in ED >>improved w/ NTG gtt, now weaned off - long h/o poor control, despite reported med compliance - multiple presentations for flash pulmonary edema - Renal artery dopplers showed >60% RAS of R kidney - R/o other secondary causes, check serum metanepherines, rena:aldosterone ratio  - GDMT titration per above - reports h/o snoring, will need outpatient sleep  study    3. PAF - h/o TIA in 2001 w/ afib detection on Holter monitor  - in NSR on EKG on admission, remains in NSR  - recommend restarting a/c for secondary prevention  - continue Coreg for rate control    4. ? PNA - CXR suggestive of possible PNA, PCT 0.21. Respiratory panel negative  - got dose of levaquin yesterday   5. Elevated Hs Trop  - 320, denies CP. Suspect demand ischemia from hypertensive urgency/ CHF  - EF stable on yesterdays echo   6. AKI  - b/l SCr ~0.8 - 1.03 on admit. .91 today - improved with diuresis  7. Hyperlipidemia - LDL 143, goal <55%, continue statin  Length of Stay: 1  Alen Bleacher, NP  12/09/2022, 9:11 AM  Advanced Heart Failure Team Pager [phone] (M-F; 7a - 5p)  Please contact [facility] for night-coverage after hours (5p -7a ) and weekends on [email or web address]

## 2022-12-10 DIAGNOSIS — I5033 Acute on chronic diastolic (congestive) heart failure: Secondary | ICD-10-CM | POA: Diagnosis not present

## 2022-12-10 DIAGNOSIS — E785 Hyperlipidemia, unspecified: Secondary | ICD-10-CM | POA: Diagnosis not present

## 2022-12-10 DIAGNOSIS — I160 Hypertensive urgency: Secondary | ICD-10-CM | POA: Diagnosis not present

## 2022-12-10 DIAGNOSIS — I480 Paroxysmal atrial fibrillation: Secondary | ICD-10-CM | POA: Diagnosis not present

## 2022-12-10 LAB — BASIC METABOLIC PANEL
Anion gap: 10 (ref 5–15)
BUN: 26 mg/dL — ABNORMAL HIGH (ref 8–23)
CO2: 26 mmol/L (ref 22–32)
Calcium: 9.7 mg/dL (ref 8.9–10.3)
Chloride: 99 mmol/L (ref 98–111)
Creatinine, Ser: 1.03 mg/dL — ABNORMAL HIGH (ref 0.44–1.00)
GFR, Estimated: 57 mL/min — ABNORMAL LOW (ref >60)
Glucose, Bld: 108 mg/dL — ABNORMAL HIGH (ref 70–99)
Potassium: 4.3 mmol/L (ref 3.5–5.1)
Sodium: 135 mmol/L (ref 135–145)

## 2022-12-10 MED ORDER — SPIRONOLACTONE 25 MG PO TABS: 12.5000 mg | ORAL_TABLET | Freq: Every day | ORAL | 0 refills | Status: DC

## 2022-12-10 MED ORDER — EMPAGLIFLOZIN 10 MG PO TABS: 10.0000 mg | ORAL_TABLET | Freq: Every day | ORAL | 0 refills | Status: AC

## 2022-12-10 MED ORDER — ATORVASTATIN CALCIUM 40 MG PO TABS: 40.0000 mg | ORAL_TABLET | Freq: Every evening | ORAL | 0 refills | Status: DC

## 2022-12-10 MED ORDER — CARVEDILOL 12.5 MG PO TABS: 12.5000 mg | ORAL_TABLET | Freq: Two times a day (BID) | ORAL | 0 refills | Status: DC

## 2022-12-10 MED ORDER — SACUBITRIL-VALSARTAN 24-26 MG PO TABS: 1.0000 | ORAL_TABLET | Freq: Two times a day (BID) | ORAL | 0 refills | Status: DC

## 2022-12-10 MED ORDER — EMPAGLIFLOZIN 10 MG PO TABS: 10.0000 mg | ORAL_TABLET | Freq: Every day | ORAL | 0 refills | Status: DC

## 2022-12-10 MED ORDER — FUROSEMIDE 40 MG PO TABS: 40.0000 mg | ORAL_TABLET | Freq: Every day | ORAL | 0 refills | Status: DC | PRN

## 2022-12-10 MED ORDER — FUROSEMIDE 40 MG PO TABS: 40.0000 mg | ORAL_TABLET | Freq: Every day | ORAL | Status: DC | PRN

## 2022-12-10 MED ORDER — SACUBITRIL-VALSARTAN 24-26 MG PO TABS: 1.0000 | ORAL_TABLET | Freq: Two times a day (BID) | ORAL | 0 refills | Status: AC

## 2022-12-10 NOTE — Plan of Care (Signed)

## 2022-12-10 NOTE — Progress Notes (Signed)
Pt had c/o of chest pressure 4/10; was also slightly dizzy. Pt was lying in bed when 1st observed. When pt sat up is when the chest opressure happened. Initial BP at 84/66. Dr, Cathlean Sauer updated. Rechecked BP at 103/70 now. Pt was instructed per MD to sit up from laying slowly and from sitting to standing  slowly and to make sure she is not dizzy before continuing with any activity. Dr. Cathlean Sauer iondicated pt can go home with above precautions.  Husband is with pt and also received instructions  to remind pt.

## 2022-12-10 NOTE — Discharge Summary (Addendum)
Physician Discharge Summary   Patient: Brooke Gray MRN: [id number] DOB: [birth year]  Admit date:     12/07/2022  Discharge date: 12/10/22  Discharge Physician: York Ram Jamisyn Langer   PCP: Jackie Plum, MD   Recommendations at discharge:    Patient has been placed on guideline recommended therapy for heart failure, with entresto, carvedilol, empaglioflozin and spironolactone. Diuresis with furosemide 40 mg as needed for volume overload, 3 to 4 lbs weight gain in 24 hrs or 5 lbs in 7 days.  Ziopatch in place for arrhythmia burden monitoring. Follow up with Cardiology as outpatient for possible genetic testing. Follow up wit Dr Julio Sicks in 7 to 10 days. Follow up renal function and electrolytes in 7 days.   I spoke with patient's husband over the phone at the bedside, we talked in detail about patient's condition, plan of care and prognosis and all questions were addressed.   Discharge Diagnoses: Principal Problem:   Acute on chronic diastolic CHF (congestive heart failure) (HCC) Active Problems:   Hypertensive urgency   Paroxysmal atrial fibrillation (HCC)   Hyperlipemia   Thrombocytopenia (HCC)  Resolved Problems:   * No resolved hospital problems. [facility] Course: Mrs. Brooke Gray was admitted to the hospital with the working diagnosis of decompensated heart failure.  77 yo female with the past medical history of hypertension, diastolic heart failure, and paroxysmal atrial fibrillation who presented with respiratory of distress. Patient reported worsening dyspnea and and lower extremity edema, along with orthopnea. Apparently she stopped taking furosemide at home. 4 days prior her admission, she was seen at the ED for acute pulmonary edema, placed on Bipap and treated with furosemide IV. She was advices to resume torsemide.  Because with respiratory distress she called EMS, her 02 was 52%, placed on Cpap and transferred to the ED. On her initial physical examination  her blood pressure was 104/84, 197/113, HR 59, RR 22 and 02 saturation 94%, lungs with no rales or wheezing, heart with S1 and S2 present and rhythmic, abdomen with no distention, no lower extremity edema.   Na 140, K 4,2 CL 103 bicarbonate 26 glucose 185, bun 15 and cr 1,0 BNP 420  Wbc 7.8 hgb 13.4 plt 128  Sars COVID 19 negative Influenza negative   Chest radiograph with cardiomegaly with bilateral hilar vascular congestion, bilateral interstitial infiltrates, predominant at the lower lobes.   EKG 61 bpm, left axis, left anterior fascicular block, sinus rhythm with no significant ST segment or T wave changes.  Patient has been placed on IV furosemide for diuresis.  Blood pressure has improved.   01/27 volume status and blood pressure have improved.  Patient will continue furosemide as needed, follow up with cardiology as outpatient.   Assessment and Plan: * Acute on chronic diastolic CHF (congestive heart failure) (HCC) Echocardiogram with preserved LV systolic function 60 to 65%, severe LVH, increased septal thickening, pericardial effusion and thickened valve leaflets. RV systolic function preserved.   Cardiac MRI with severe asymmetric septal hypertrophy but no evidence for LVOT gradient or mitral valve SAM.  Findings consistent with variant of hypertrophic cardiomyopathy.   Patient was placed on IV furosemide for diuresis, negative fluid balance was achieved, with significant improvement in her symptoms.   Medical therapy with carvedilol, empagliflozin, entresto and spironolactone.  Continue furosemide as needed for signs of volume overload.  Patient has a Zipatch to evaluate arrhythmia burden.  Genetic testing as outpatient.   Acute cardiogenic pulmonary edema, ruled out for pneumonia.  Elevated troponin due to  heart failure exacerbation, acute coronary syndrome has been ruled out.   Hypertensive urgency Carvedilol, entresto and diuresis with empagloflozin and furosemide.    Paroxysmal atrial fibrillation (HCC) Continue rate control with carvedilol  Patient has been in sinus rhythm. Patient had TIA 2001 with a fib detection per holter monitor.   I spoke with her about secondary CVA prevention with apixaban. She will think about that and will follow up as outpatient. For no she would like to continue holding apixaban. Concerns are taking a bid daily medication, and risk of bleeding, plus medication cost.   Hyperlipemia Continue atorvastatin.   Thrombocytopenia (HCC) Cell count stable.          Consultants: cardiology  Procedures performed: none  Disposition: Home Diet recommendation:  Carb modified diet DISCHARGE MEDICATION: Allergies as of 12/10/2022       Reactions   Penicillins Hives        Medication List     STOP taking these medications    amLODipine 5 MG tablet Commonly known as: NORVASC   apixaban 5 MG Tabs tablet Commonly known as: Eliquis   carvedilol 40 MG 24 hr capsule Commonly known as: COREG CR   felodipine 5 MG 24 hr tablet Commonly known as: PLENDIL   Magnesium 400 MG Caps   olmesartan 40 MG tablet Commonly known as: BENICAR   omeprazole 40 MG capsule Commonly known as: PRILOSEC   torsemide 5 MG tablet Commonly known as: DEMADEX   VITAMIN B-12 PO       TAKE these medications    acetaminophen 325 MG tablet Commonly known as: TYLENOL Take 2 tablets (650 mg total) by mouth every 4 (four) hours as needed for mild pain (or temp > 37.5 C (99.5 F)).   atorvastatin 40 MG tablet Commonly known as: LIPITOR Take 1 tablet (40 mg total) by mouth every evening. What changed: when to take this   carvedilol 12.5 MG tablet Commonly known as: COREG Take 1 tablet (12.5 mg total) by mouth 2 (two) times daily. What changed:  medication strength how much to take   empagliflozin 10 MG Tabs tablet Commonly known as: JARDIANCE Take 1 tablet (10 mg total) by mouth daily. Start taking on: December 11, 2022    ferrous sulfate 325 (65 FE) MG EC tablet Take 325 mg by mouth daily as needed (cold hands).   fluticasone 50 MCG/ACT nasal spray Commonly known as: FLONASE Place 2 sprays into both nostrils daily.   furosemide 40 MG tablet Commonly known as: LASIX Take 1 tablet (40 mg total) by mouth daily as needed for edema or fluid (take if weight gain 2 to 3 lbs i n24 hrs or 5 lbs in 7 days.).   latanoprost 0.005 % ophthalmic solution Commonly known as: XALATAN Place 1 drop into both eyes at bedtime.   sacubitril-valsartan 24-26 MG Commonly known as: ENTRESTO Take 1 tablet by mouth 2 (two) times daily.   spironolactone 25 MG tablet Commonly known as: ALDACTONE Take 0.5 tablets (12.5 mg total) by mouth daily. Start taking on: December 11, 2022   vitamin C 1000 MG tablet Take 1,000 mg by mouth daily.   VITAMIN D3 PO Take 5,000 mcg by mouth daily.        Follow-up Information     [facility] Follow up.   Specialty: Cardiology Why: 12/19/22 at 12:00 PM   Hospital Follow-up in the Advanced Heart Failure Clinic at [facility] [place] [facility] Contact information:  [address] [id number] [place] [id number] [phone]               Discharge Exam: Ceasar MonsFiled Weights   12/09/22 0434 12/10/22 0510  Weight: 58.7 kg 56.4 kg   BP 101/63 (BP Location: Left Arm)   Pulse 60   Temp 97.9 F (36.6 C) (Oral)   Resp 10   Wt 56.4 kg   SpO2 96%   BMI 22.04 kg/m   Patient is feeling better with no dyspnea or orthopnea, no chest pain or edema  Neurology awake and alert ENT with no pallor Cardiovascular with S1 and S2 present and rhythmic with no gallops, rubs or murmurs Respiratory with no rales or wheezing Abdomen with no distention  No lower extremity edema   Condition at discharge: stable  The results of significant diagnostics from this hospitalization (including imaging, microbiology, ancillary and  laboratory) are listed below for reference.   Imaging Studies: VAS US RENAL ARTERY DUPLEX  Result Date: 12/08/2022 ABDOMINAL VISCERAL Patient [facility]  [facility]  Date of Exam:   12/08/2022 Medical Rec #: [id number]        Accession #:    [id number] Date of Birth: 01/11/1946        Patient Gender: F Patient Age:   [age] years [facility] Procedure:      VAS US RENAL ARTERY DUPLEX Referring Phys: [id number] BRITTAINY M SIMMONS -------------------------------------------------------------------------------- Indications: Resistant hypertension Limitations: Air/bowel gas. Comparison Study: No prior studies. Performing Technologist: Jean Rosenthalachel Hodge RDMS, RVT  Examination Guidelines: A complete evaluation includes B-mode imaging, spectral Doppler, color Doppler, and power Doppler as needed of all accessible portions of each vessel. Bilateral testing is considered an integral part of a complete examination. Limited examinations for reoccurring indications may be performed as noted.  Duplex Findings: +----------------------+--------+--------+------+--------+ Mesenteric            PSV cm/sEDV cm/sPlaqueComments +----------------------+--------+--------+------+--------+ Aorta Mid                52                          +----------------------+--------+--------+------+--------+ Celiac Artery Proximal  132                          +----------------------+--------+--------+------+--------+ SMA Proximal            247                          +----------------------+--------+--------+------+--------+    +------------------+--------+--------+-------+ Right Renal ArteryPSV cm/sEDV cm/sComment +------------------+--------+--------+-------+ Origin              325      79           +------------------+--------+--------+-------+ Proximal            167      32           +------------------+--------+--------+-------+ Mid                 146      35            +------------------+--------+--------+-------+ Distal               60      19           +------------------+--------+--------+-------+ +-----------------+--------+--------+-------+ Left Renal ArteryPSV cm/sEDV cm/sComment +-----------------+--------+--------+-------+ Origin  74      19           +-----------------+--------+--------+-------+ Proximal            92      29           +-----------------+--------+--------+-------+ Mid                107      31           +-----------------+--------+--------+-------+ Distal             103      34           +-----------------+--------+--------+-------+ +------------+--------+--------+----+-----------+--------+--------+----+ Right KidneyPSV cm/sEDV cm/sRI  Left KidneyPSV cm/sEDV cm/sRI   +------------+--------+--------+----+-----------+--------+--------+----+ Upper Pole  10      4       0.61Upper Pole 33      9       0.73 +------------+--------+--------+----+-----------+--------+--------+----+ Mid         30      9       0.72Mid        29      11      0.64 +------------+--------+--------+----+-----------+--------+--------+----+ Lower Pole  39      11      0.72Lower Pole 31      11      0.63 +------------+--------+--------+----+-----------+--------+--------+----+ Hilar       30      11      0.63Hilar      30      11      0.64 +------------+--------+--------+----+-----------+--------+--------+----+ +------------------+-----+------------------+-----+ Right Kidney           Left Kidney             +------------------+-----+------------------+-----+ RAR                    RAR                     +------------------+-----+------------------+-----+ RAR (manual)      6.25 RAR (manual)      2.06  +------------------+-----+------------------+-----+ Cortex                 Cortex                  +------------------+-----+------------------+-----+ Cortex thickness       Corex  thickness         +------------------+-----+------------------+-----+ Kidney length (cm)10.40Kidney length (cm)10.96 +------------------+-----+------------------+-----+  Summary: Renal:  Right: Evidence of a > 60% stenosis at the origin of the right renal        artery. Abnormal right Resistive Index. RRV flow present.        Normal size right kidney. Left:  No evidence of left renal artery stenosis. Normal left        Resistive Index. LRV flow present. Normal size of left        kidney. Mesenteric: Normal Celiac artery and Superior Mesenteric artery findings.  *See table(s) above for measurements and observations.  Diagnosing physician: Monica Martinez MD  Electronically signed by Monica Martinez MD on 12/08/2022 at 4:50:57 PM.    Final    MR CARDIAC VELOCITY FLOW MAP  Result Date: 12/08/2022 CLINICAL DATA:  Assess for cardiac amyloidosis. EXAM: CARDIAC MRI TECHNIQUE: The patient was scanned on a 1.5 Tesla GE magnet. A dedicated cardiac coil was used. Functional imaging was done using Fiesta sequences. 2,3, and 4 chamber views were done to assess for RWMA's. Modified Simpson's  rule using a short axis stack was used to calculate an ejection fraction on a dedicated work [employer], [employer]. The patient received 10 cc of Gadavist. After 10 minutes inversion recovery sequences were used to assess for infiltration and scar tissue. CONTRAST:  Gadavist 10 cc FINDINGS: Limited images of the lung fields showed no gross abnormalities. Trivial pericardial effusion. Normal left ventricular size. Severe asymmetric septal hypertrophy (18 mm basal septum, 8 mm basal inferolateral wall). No turbulence to suggest LV outflow tract gradient and no mitral valve systolic anterior motion. Low normal systolic function, EF 54%. Normal right ventricular size and systolic function, EF 55%. Normal right atrial size. Moderate left atrial enlargement. Trileaflet aortic valve, no significant stenosis or regurgitation.  Mild mitral regurgitation, regurgitant fraction 9%. Delayed enhancement imaging: Extensive mid-wall late gadolinium enhancement involving the entire septum, the mid-apical anterior wall, mid-apical inferior wall, and mid-apical lateral wall. MEASUREMENTS: MEASUREMENTS LVEDV 113 mL LVEDVi 70 mL/m2 LVSV 61 mL LVEF 54% RVEDV 82 mL RVEDVi 51 mL/m2 RVSV 45 mL RVEF 55% Aortic forward volume 56 mL T1 1079, ECV 34% IMPRESSION: 1. Low normal LV systolic function, EF 54%. There was severe asymmetric septal hypertrophy but no evidence for LVOT gradient or mitral valve SAM. 2.  Normal RV size and systolic function, EF 55%. 3.  Extensive noncoronary pattern mid-wall LGE. 4. Extracellular volume percentage mildly elevated at 34% suggesting increased myocardial fibrosis. Findings on this study seem most characteristic of a variant of hypertrophic cardiomyopathy. Cannot fully rule out cardiac amyloidosis which could also present similarly to this, however ECV percentage < 40% is less suggestive. Would consider gene testing for HCM and TTR, also PYP scan. Dalton Mclean Electronically Signed   By: Marca Ancona M.D.   On: 12/08/2022 14:13   MR CARDIAC VELOCITY FLOW MAP  Result Date: 12/08/2022 CLINICAL DATA:  Assess for cardiac amyloidosis. EXAM: CARDIAC MRI TECHNIQUE: The patient was scanned on a 1.5 Tesla GE magnet. A dedicated cardiac coil was used. Functional imaging was done using Fiesta sequences. 2,3, and 4 chamber views were done to assess for RWMA's. Modified Simpson's rule using a short axis stack was used to calculate an ejection fraction on a dedicated work [employer] officer, [employer]. The patient received 10 cc of Gadavist. After 10 minutes inversion recovery sequences were used to assess for infiltration and scar tissue. CONTRAST:  Gadavist 10 cc FINDINGS: Limited images of the lung fields showed no gross abnormalities. Trivial pericardial effusion. Normal left ventricular size. Severe asymmetric septal  hypertrophy (18 mm basal septum, 8 mm basal inferolateral wall). No turbulence to suggest LV outflow tract gradient and no mitral valve systolic anterior motion. Low normal systolic function, EF 54%. Normal right ventricular size and systolic function, EF 55%. Normal right atrial size. Moderate left atrial enlargement. Trileaflet aortic valve, no significant stenosis or regurgitation. Mild mitral regurgitation, regurgitant fraction 9%. Delayed enhancement imaging: Extensive mid-wall late gadolinium enhancement involving the entire septum, the mid-apical anterior wall, mid-apical inferior wall, and mid-apical lateral wall. MEASUREMENTS: MEASUREMENTS LVEDV 113 mL LVEDVi 70 mL/m2 LVSV 61 mL LVEF 54% RVEDV 82 mL RVEDVi 51 mL/m2 RVSV 45 mL RVEF 55% Aortic forward volume 56 mL T1 1079, ECV 34% IMPRESSION: 1. Low normal LV systolic function, EF 54%. There was severe asymmetric septal hypertrophy but no evidence for LVOT gradient or mitral valve SAM. 2.  Normal RV size and systolic function, EF 55%. 3.  Extensive noncoronary pattern mid-wall LGE. 4. Extracellular volume percentage mildly elevated at 34% suggesting  increased myocardial fibrosis. Findings on this study seem most characteristic of a variant of hypertrophic cardiomyopathy. Cannot fully rule out cardiac amyloidosis which could also present similarly to this, however ECV percentage < 40% is less suggestive. Would consider gene testing for HCM and TTR, also PYP scan. Dalton Mclean Electronically Signed   By: Marca Anconaalton  Mclean M.D.   On: 12/08/2022 14:12   MR CARDIAC MORPHOLOGY W WO CONTRAST  Result Date: 12/08/2022 CLINICAL DATA:  Assess for cardiac amyloidosis. EXAM: CARDIAC MRI TECHNIQUE: The patient was scanned on a 1.5 Tesla GE magnet. A dedicated cardiac coil was used. Functional imaging was done using Fiesta sequences. 2,3, and 4 chamber views were done to assess for RWMA's. Modified Simpson's rule using a short axis stack was used to calculate an ejection  fraction on a dedicated work Research officer, trade unionstation using Circle software. The patient received 10 cc of Gadavist. After 10 minutes inversion recovery sequences were used to assess for infiltration and scar tissue. CONTRAST:  Gadavist 10 cc FINDINGS: Limited images of the lung fields showed no gross abnormalities. Trivial pericardial effusion. Normal left ventricular size. Severe asymmetric septal hypertrophy (18 mm basal septum, 8 mm basal inferolateral wall). No turbulence to suggest LV outflow tract gradient and no mitral valve systolic anterior motion. Low normal systolic function, EF 54%. Normal right ventricular size and systolic function, EF 55%. Normal right atrial size. Moderate left atrial enlargement. Trileaflet aortic valve, no significant stenosis or regurgitation. Mild mitral regurgitation, regurgitant fraction 9%. Delayed enhancement imaging: Extensive mid-wall late gadolinium enhancement involving the entire septum, the mid-apical anterior wall, mid-apical inferior wall, and mid-apical lateral wall. MEASUREMENTS: MEASUREMENTS LVEDV 113 mL LVEDVi 70 mL/m2 LVSV 61 mL LVEF 54% RVEDV 82 mL RVEDVi 51 mL/m2 RVSV 45 mL RVEF 55% Aortic forward volume 56 mL T1 1079, ECV 34% IMPRESSION: 1. Low normal LV systolic function, EF 54%. There was severe asymmetric septal hypertrophy but no evidence for LVOT gradient or mitral valve SAM. 2.  Normal RV size and systolic function, EF 55%. 3.  Extensive noncoronary pattern mid-wall LGE. 4. Extracellular volume percentage mildly elevated at 34% suggesting increased myocardial fibrosis. Findings on this study seem most characteristic of a variant of hypertrophic cardiomyopathy. Cannot fully rule out cardiac amyloidosis which could also present similarly to this, however ECV percentage < 40% is less suggestive. Would consider gene testing for HCM and TTR, also PYP scan. Dalton Mclean Electronically Signed   By: Marca Anconaalton  Mclean M.D.   On: 12/08/2022 14:12   ECHOCARDIOGRAM  COMPLETE  Result Date: 12/07/2022    ECHOCARDIOGRAM REPORT   [facility] Date of Exam: 12/07/2022 Medical Rec #:  [id number]       Height:       63.0 in Accession #:    [id number]      Weight:       128.0 lb Date of Birth:  01/02/1946       BSA:          1.600 m Patient Age:    76 years        BP:           124/75 mmHg Patient Gender: F               HR:           61 bpm. Exam Location:  Inpatient Procedure: 2D Echo Indications:    CHF  History:        Patient has prior history of Echocardiogram examinations,  most                 recent 11/23/2019. Hypertrophic Cardiomyopathy and CHF, TIA; Risk                 Factors:Hypertension and Dyslipidemia.  Sonographer:    Harvie Junior Referring Phys: [id number] RONDELL A SMITH IMPRESSIONS  1. See prior recomendation regarding r/o Amyloid given increased septal thickness with specking, pericardial effusoin and thickened valve leaflets Would have patient return for Strain imaging TDI velocities above 10 would argue against amyloid . Left ventricular ejection fraction, by estimation, is 60 to 65%. The left ventricle has normal function. The left ventricle has no regional wall motion abnormalities. There is severe left ventricular hypertrophy. Left ventricular diastolic parameters were normal.  2. Right ventricular systolic function is normal. The right ventricular size is normal. There is normal pulmonary artery systolic pressure.  3. The mitral valve is abnormal. Trivial mitral valve regurgitation. No evidence of mitral stenosis.  4. The aortic valve is tricuspid. Aortic valve regurgitation is not visualized. No aortic stenosis is present.  5. The inferior vena cava is normal in size with greater than 50% respiratory variability, suggesting right atrial pressure of 3 mmHg. FINDINGS  Left Ventricle: See prior recomendation regarding r/o Amyloid given increased septal thickness with specking, pericardial effusoin and thickened valve leaflets Would have patient  return for Strain imaging TDI velocities above 10 would argue against amyloid. Left ventricular ejection fraction, by estimation, is 60 to 65%. The left ventricle has normal function. The left ventricle has no regional wall motion abnormalities. The left ventricular internal cavity size was normal in size. There is severe left ventricular hypertrophy. Left ventricular diastolic parameters were normal. Right Ventricle: The right ventricular size is normal. No increase in right ventricular wall thickness. Right ventricular systolic function is normal. There is normal pulmonary artery systolic pressure. The tricuspid regurgitant velocity is 1.69 m/s, and  with an assumed right atrial pressure of 3 mmHg, the estimated right ventricular systolic pressure is 73.2 mmHg. Left Atrium: Left atrial size was normal in size. Right Atrium: Right atrial size was normal in size. Pericardium: Trivial pericardial effusion is present. The pericardial effusion is posterior to the left ventricle. Mitral Valve: The mitral valve is abnormal. There is mild thickening of the mitral valve leaflet(s). Trivial mitral valve regurgitation. No evidence of mitral valve stenosis. Tricuspid Valve: The tricuspid valve is normal in structure. Tricuspid valve regurgitation is mild . No evidence of tricuspid stenosis. Aortic Valve: The aortic valve is tricuspid. Aortic valve regurgitation is not visualized. Aortic regurgitation PHT measures 1309 msec. No aortic stenosis is present. Aortic valve mean gradient measures 3.0 mmHg. Aortic valve peak gradient measures 5.5 mmHg. Aortic valve area, by VTI measures 1.96 cm. Pulmonic Valve: The pulmonic valve was normal in structure. Pulmonic valve regurgitation is not visualized. No evidence of pulmonic stenosis. Aorta: The aortic root is normal in size and structure. Venous: The inferior vena cava is normal in size with greater than 50% respiratory variability, suggesting right atrial pressure of 3 mmHg.  IAS/Shunts: No atrial level shunt detected by color flow Doppler.  LEFT VENTRICLE PLAX 2D LVIDd:         3.10 cm     Diastology LVIDs:         2.10 cm     LV e' medial:    4.68 cm/s LV PW:         1.30 cm     LV E/e' medial:  17.1 LV IVS:        1.70 cm     LV e' lateral:   6.42 cm/s LVOT diam:     1.80 cm     LV E/e' lateral: 12.5 LV SV:         50 LV SV Index:   31 LVOT Area:     2.54 cm                             3D Volume EF: LV Volumes (MOD)           3D EF:        59 % LV vol d, MOD A2C: 74.7 ml LV EDV:       90 ml LV vol d, MOD A4C: 88.9 ml LV ESV:       37 ml LV vol s, MOD A2C: 37.8 ml LV SV:        53 ml LV vol s, MOD A4C: 36.6 ml LV SV MOD A2C:     36.9 ml LV SV MOD A4C:     88.9 ml LV SV MOD BP:      46.2 ml RIGHT VENTRICLE RV Basal diam:  3.10 cm RV Mid diam:    2.40 cm RV S prime:     18.30 cm/s TAPSE (M-mode): 1.6 cm LEFT ATRIUM             Index        RIGHT ATRIUM           Index LA diam:        3.10 cm 1.94 cm/m   RA Area:     13.80 cm LA Vol (A2C):   50.8 ml 31.76 ml/m  RA Volume:   35.00 ml  21.88 ml/m LA Vol (A4C):   52.4 ml 32.76 ml/m LA Biplane Vol: 52.8 ml 33.01 ml/m  AORTIC VALVE                    PULMONIC VALVE AV Area (Vmax):    2.12 cm     PV Vmax:       1.14 m/s AV Area (Vmean):   1.99 cm     PV Peak grad:  5.2 mmHg AV Area (VTI):     1.96 cm AV Vmax:           117.00 cm/s AV Vmean:          76.800 cm/s AV VTI:            0.256 m AV Peak Grad:      5.5 mmHg AV Mean Grad:      3.0 mmHg LVOT Vmax:         97.40 cm/s LVOT Vmean:        59.950 cm/s LVOT VTI:          0.198 m LVOT/AV VTI ratio: 0.77 AI PHT:            1309 msec  AORTA Ao Root diam: 3.60 cm MITRAL VALVE               TRICUSPID VALVE MV Area (PHT): 3.42 cm    TR Peak grad:   11.4 mmHg MV Decel Time: 222 msec    TR Vmax:        169.00 cm/s MR Peak grad: 70.9 mmHg MR Vmax:      421.00 cm/s  SHUNTS MV E velocity: 80.10 cm/s  Systemic VTI:  0.20 m MV A velocity: 61.00 cm/s  Systemic Diam: 1.80 cm MV E/A ratio:  1.31  Charlton Haws MD Electronically signed by Charlton Haws MD Signature Date/Time: 12/07/2022/2:54:12 PM    Final    DG Chest Port 1 View  Result Date: 12/07/2022 CLINICAL DATA:  77 year old female with history of shortness of breath. EXAM: PORTABLE CHEST 1 VIEW COMPARISON:  Chest x-ray 12/03/2022. FINDINGS: Lung volumes are normal. Worsening diffuse interstitial prominence, widespread peribronchial cuffing, and ill-defined airspace consolidation scattered throughout the lungs bilaterally, most severe throughout the mid to lower lungs (right greater than left). Probable trace bilateral pleural effusions. No pneumothorax. Pulmonary vasculature is largely obscured. Heart size appears borderline enlarged. Upper mediastinal contours are within normal limits. Atherosclerotic calcifications are noted in the thoracic aorta. IMPRESSION: 1. The appearance of the chest is most compatible with progressive severe multilobar bilateral pneumonia, most likely from atypical organisms such as a viral infection. 2. Trace bilateral pleural effusions. 3. Aortic atherosclerosis. Electronically Signed   By: Trudie Reed M.D.   On: 12/07/2022 05:02   CT Angio Chest Pulmonary Embolism (PE) W or WO Contrast  Result Date: 12/03/2022 CLINICAL DATA:  Pulmonary embolism suspected, high probability. EXAM: CT ANGIOGRAPHY CHEST WITH CONTRAST TECHNIQUE: Multidetector CT imaging of the chest was performed using the standard protocol during bolus administration of intravenous contrast. Multiplanar CT image reconstructions and MIPs were obtained to evaluate the vascular anatomy. RADIATION DOSE REDUCTION: This exam was performed according to the departmental dose-optimization program which includes automated exposure control, adjustment of the mA and/or kV according to patient size and/or use of iterative reconstruction technique. CONTRAST:  54mL OMNIPAQUE IOHEXOL 350 MG/ML SOLN COMPARISON:  None Available. FINDINGS: Cardiovascular: Satisfactory  opacification of the pulmonary arteries to the segmental level. No evidence of pulmonary embolism. Large heart size. No pericardial effusion. Diffuse atheromatous plaque on the aorta. Mediastinum/Nodes: Negative for adenopathy or mass. Lungs/Pleura: Emphysema. Small pleural effusions with dependent atelectasis. Generalized airway thickening. 6 mm average diameter subpleural nodule in the right upper lobe on 6:55 Upper Abdomen: No acute finding.  Atheromatous plaque in the aorta. Musculoskeletal: No acute or aggressive finding. Review of the MIP images confirms the above findings. IMPRESSION: 1. Negative for pulmonary embolism. 2. Atelectasis and small pleural effusions. 3. Generalized airway thickening which could be acute or chronic. Centrilobular emphysema. 4. 6 mm pulmonary nodule in the right upper lobe. Non-contrast chest CT at 6-12 months is recommended. If the nodule is stable at time of repeat CT, then future CT at 18-24 months (from today's scan) is considered optional for low-risk patients, but is recommended for high-risk patients. This recommendation follows the consensus statement: Guidelines for Management of Incidental Pulmonary Nodules Detected on CT Images: From the Fleischner Society 2017; Radiology 2017; 284:228-243. Electronically Signed   By: Tiburcio Pea M.D.   On: 12/03/2022 07:48   DG Chest Portable 1 View  Result Date: 12/03/2022 CLINICAL DATA:  Dyspnea EXAM: PORTABLE CHEST 1 VIEW COMPARISON:  11/30/2019 FINDINGS: Pulmonary insufflation is normal and symmetric. Stable mild cardiomegaly. Central pulmonary vascular congestion has progressed and superimposed perihilar interstitial pulmonary infiltrate has developed in keeping with progressive cardiogenic failure. Asymmetric airspace infiltrate within the right lung base may represent asymmetric pulmonary edema, though a developing pneumonic infiltrate could appear similarly. No pneumothorax or pleural effusion. No acute bone abnormality.  IMPRESSION: 1. Progressive cardiogenic failure. 2. Asymmetric airspace infiltrate within the right lung base may represent asymmetric pulmonary edema, though a developing pneumonic infiltrate could appear  similarly. Electronically Signed   By: Helyn Numbers M.D.   On: 12/03/2022 02:34    Microbiology: Results for orders placed or performed during the hospital encounter of 12/07/22  Resp panel by RT-PCR (RSV, Flu A&B, Covid) Anterior Nasal Swab     Status: None   Collection Time: 12/07/22  3:36 AM   Specimen: Anterior Nasal Swab  Result Value Ref Range Status   SARS Coronavirus 2 by RT PCR NEGATIVE NEGATIVE Final    Comment: (NOTE) SARS-CoV-2 target nucleic acids are NOT DETECTED.  The SARS-CoV-2 RNA is generally detectable in upper respiratory specimens during the acute phase of infection. The lowest concentration of SARS-CoV-2 viral copies this assay can detect is 138 copies/mL. A negative result does not preclude SARS-Cov-2 infection and should not be used as the sole basis for treatment or other patient management decisions. A negative result may occur with  improper specimen collection/handling, submission of specimen other than nasopharyngeal swab, presence of viral mutation(s) within the areas targeted by this assay, and inadequate number of viral copies(<138 copies/mL). A negative result must be combined with clinical observations, patient history, and epidemiological information. The expected result is Negative.  Fact Sheet for Patients:  BloggerCourse.com  Fact Sheet for Healthcare Providers:  SeriousBroker.it  This test is no t yet approved or cleared by the [place] FDA and  has been authorized for detection and/or diagnosis of SARS-CoV-2 by FDA under an Emergency Use Authorization (EUA). This EUA will remain  in effect (meaning this test can be used) for the duration of the COVID-19 declaration under Section  564(b)(1) of the Act, 21 U.S.C.section 360bbb-3(b)(1), unless the authorization is terminated  or revoked sooner.       Influenza A by PCR NEGATIVE NEGATIVE Final   Influenza B by PCR NEGATIVE NEGATIVE Final    Comment: (NOTE) The Xpert Xpress SARS-CoV-2/FLU/RSV plus assay is intended as an aid in the diagnosis of influenza from Nasopharyngeal swab specimens and should not be used as a sole basis for treatment. Nasal washings and aspirates are unacceptable for Xpert Xpress SARS-CoV-2/FLU/RSV testing.  Fact Sheet for Patients: BloggerCourse.com  Fact Sheet for Healthcare Providers: SeriousBroker.it  This test is not yet approved or cleared by the [place] FDA and has been authorized for detection and/or diagnosis of SARS-CoV-2 by FDA under an Emergency Use Authorization (EUA). This EUA will remain in effect (meaning this test can be used) for the duration of the COVID-19 declaration under Section 564(b)(1) of the Act, 21 U.S.C. section 360bbb-3(b)(1), unless the authorization is terminated or revoked.     Resp Syncytial Virus by PCR NEGATIVE NEGATIVE Final    Comment: (NOTE) Fact Sheet for Patients: BloggerCourse.com  Fact Sheet for Healthcare Providers: SeriousBroker.it  This test is not yet approved or cleared by the [place] FDA and has been authorized for detection and/or diagnosis of SARS-CoV-2 by FDA under an Emergency Use Authorization (EUA). This EUA will remain in effect (meaning this test can be used) for the duration of the COVID-19 declaration under Section 564(b)(1) of the Act, 21 U.S.C. section 360bbb-3(b)(1), unless the authorization is terminated or revoked.  Performed at [facility], [address] [place], [place] [address]   Blood culture (routine x 2)     Status: None (Preliminary result)   Collection Time: 12/07/22  5:20 AM    Specimen: BLOOD RIGHT FOREARM  Result Value Ref Range Status   Specimen Description BLOOD RIGHT FOREARM  Final   Special Requests  Final    BOTTLES DRAWN AEROBIC AND ANAEROBIC Blood Culture adequate volume   Culture   Final    NO GROWTH 3 DAYS Performed at [facility], [address] [place], [place] [address]    Report Status PENDING  Incomplete  Blood culture (routine x 2)     Status: None (Preliminary result)   Collection Time: 12/07/22  5:27 AM   Specimen: BLOOD LEFT HAND  Result Value Ref Range Status   Specimen Description BLOOD LEFT HAND  Final   Special Requests   Final    BOTTLES DRAWN AEROBIC AND ANAEROBIC Blood Culture adequate volume   Culture   Final    NO GROWTH 3 DAYS Performed at [facility], [address], [place], [place] [address]    Report Status PENDING  Incomplete  Respiratory (~20 pathogens) panel by PCR     Status: None   Collection Time: 12/07/22  8:20 AM   Specimen: Nasopharyngeal Swab; Respiratory  Result Value Ref Range Status   Adenovirus NOT DETECTED NOT DETECTED Final   Coronavirus 229E NOT DETECTED NOT DETECTED Final    Comment: (NOTE) The Coronavirus on the Respiratory Panel, DOES NOT test for the novel  Coronavirus (2019 nCoV)    Coronavirus HKU1 NOT DETECTED NOT DETECTED Final   Coronavirus NL63 NOT DETECTED NOT DETECTED Final   Coronavirus OC43 NOT DETECTED NOT DETECTED Final   Metapneumovirus NOT DETECTED NOT DETECTED Final   Rhinovirus / Enterovirus NOT DETECTED NOT DETECTED Final   Influenza A NOT DETECTED NOT DETECTED Final   Influenza B NOT DETECTED NOT DETECTED Final   Parainfluenza Virus 1 NOT DETECTED NOT DETECTED Final   Parainfluenza Virus 2 NOT DETECTED NOT DETECTED Final   Parainfluenza Virus 3 NOT DETECTED NOT DETECTED Final   Parainfluenza Virus 4 NOT DETECTED NOT DETECTED Final   Respiratory Syncytial Virus NOT DETECTED NOT DETECTED Final   Bordetella pertussis NOT DETECTED NOT DETECTED Final   Bordetella  Parapertussis NOT DETECTED NOT DETECTED Final   Chlamydophila pneumoniae NOT DETECTED NOT DETECTED Final   Mycoplasma pneumoniae NOT DETECTED NOT DETECTED Final    Comment: Performed at [facility], [address] [place], [place] [address]    Labs: CBC: Recent Labs  Lab 12/07/22 0344 12/08/22 0434  WBC 7.8 13.7*  HGB 13.4 12.4  HCT 38.9 35.7*  MCV 89.8 88.1  PLT 128* 146*   Basic Metabolic Panel: Recent Labs  Lab 12/07/22 0344 12/08/22 0434 12/09/22 0327 12/10/22 0113  NA 140 137 136 135  K 4.2 4.4 3.8 4.3  CL 103 104 99 99  CO2 26 27 27 26   GLUCOSE 185* 123* 110* 108*  BUN 15 18 24* 26*  CREATININE 1.03* 0.94 0.91 1.03*  CALCIUM 9.2 9.5 9.4 9.7   Liver Function Tests: Recent Labs  Lab 12/07/22 0344  AST 60*  ALT 50*  ALKPHOS 64  BILITOT 0.6  PROT 6.7  ALBUMIN 3.8   CBG: No results for input(s): "GLUCAP" in the last 168 hours.  Discharge time spent: greater than 30 minutes.  Signed: 12/09/22, MD [facility] 12/10/2022

## 2022-12-12 ENCOUNTER — Telehealth: Payer: Self-pay | Admitting: *Deleted

## 2022-12-12 LAB — CULTURE, BLOOD (ROUTINE X 2)
Culture: NO GROWTH
Culture: NO GROWTH
Special Requests: ADEQUATE
Special Requests: ADEQUATE

## 2022-12-12 NOTE — Telephone Encounter (Signed)
Left a message for the patient to call back. She will need to be added to Dr. Tyrell Antonio schedule 1/30 at the [facility] office.

## 2022-12-12 NOTE — Telephone Encounter (Signed)
Left a message on both numbers provided to call back. MyChart message has been sent

## 2022-12-12 NOTE — Telephone Encounter (Signed)
Appointment 1/30 with Dr. Fletcher Anon

## 2022-12-13 ENCOUNTER — Ambulatory Visit: Payer: Medicare HMO | Attending: Cardiovascular Disease | Admitting: Cardiovascular Disease

## 2022-12-13 ENCOUNTER — Encounter: Payer: Self-pay | Admitting: Cardiovascular Disease

## 2022-12-13 VITALS — BP 94/82 | HR 62 | Ht 63.0 in | Wt 125.6 lb

## 2022-12-13 DIAGNOSIS — I480 Paroxysmal atrial fibrillation: Secondary | ICD-10-CM

## 2022-12-13 DIAGNOSIS — I5032 Chronic diastolic (congestive) heart failure: Secondary | ICD-10-CM

## 2022-12-13 DIAGNOSIS — I701 Atherosclerosis of renal artery: Secondary | ICD-10-CM | POA: Diagnosis not present

## 2022-12-13 MED ORDER — ASPIRIN 81 MG PO TBEC: 81.0000 mg | DELAYED_RELEASE_TABLET | Freq: Every day | ORAL | 3 refills | Status: AC

## 2022-12-13 MED ORDER — CARVEDILOL 6.25 MG PO TABS: 6.2500 mg | ORAL_TABLET | Freq: Two times a day (BID) | ORAL | 1 refills | Status: DC

## 2022-12-13 NOTE — Patient Instructions (Signed)
Medication Instructions:  START Aspirin 81 mg once daily  DECREASE the Carvedilol to 6.25 mg twice daily  *If you need a refill on your cardiac medications before your next appointment, please call your pharmacy*  Testing/Procedures: Your physician has requested that you have a peripheral vascular angiogram. This exam is performed at the hospital. During this exam IV contrast is used to look at arterial blood flow. Please review the information sheet given for details.    Follow-Up: At [facility], you and your health needs are our priority.  As part of our continuing mission to provide you with exceptional heart care, we have created designated Provider Care Teams.  These Care Teams include your primary Cardiologist (physician) and Advanced Practice Providers (APPs -  Physician Assistants and Nurse Practitioners) who all work together to provide you with the care you need, when you need it.  We recommend signing up for the patient portal called "MyChart".  Sign up information is provided on this After Visit Summary.  MyChart is used to connect with patients for Virtual Visits (Telemedicine).  Patients are able to view lab/test results, encounter notes, upcoming appointments, etc.  Non-urgent messages can be sent to your provider as well.   To learn more about what you can do with MyChart, go to [email or web address].    Your next appointment:   Keep your post procedure follow up with Dr. Fletcher Anon on 01/31/23 at 10:20 am Other Instructions       Cardiac/Peripheral Catheterization   You are scheduled for a Peripheral Angiogram on Wednesday, February 21 with Dr. Kathlyn Sacramento.  1. Please arrive at the Main Entrance A at [facility]: [place], [place] [address] on February 21 at 6:30 AM (This time is two hours before your procedure to ensure your preparation). Free valet parking service is available. You will check in at ADMITTING. The support person will be  asked to wait in the waiting room.  It is OK to have someone drop you off and come back when you are ready to be discharged.        Special note: Every effort is made to have your procedure done on time. Please understand that emergencies sometimes delay scheduled procedures.   . 2. Diet: Do not eat solid foods after midnight.  You may have clear liquids until 5 AM the day of the procedure.  3. Labs: You will need to have blood drawn on-none needed  4. Medication instructions in preparation for your procedure: Hold the Spironolactone the morning of the procedure Hold the Furosemide the morning of the procedure  On the morning of your procedure, take Aspirin 81 mg and any morning medicines NOT listed above.  You may use sips of water.  5. Plan to go home the same day, you will only stay overnight if medically necessary. 6. You MUST have a responsible adult to drive you home. 7. An adult MUST be with you the first 24 hours after you arrive home. 8. Bring a current list of your medications, and the last time and date medication taken. 9. Bring ID and current insurance cards. 10.Please wear clothes that are easy to get on and off and wear slip-on shoes.  Thank you for allowing [place] to care for you!   -- [facility]

## 2022-12-13 NOTE — Progress Notes (Unsigned)
Cardiology Office Note   Date:  12/14/2022   ID:  Brooke Gray, DOB May 23, 1946, MRN [id number]  PCP:  Benito Mccreedy, MD  Cardiologist:   Kathlyn Sacramento, MD   No chief complaint on file.     History of Present Illness: Brooke Gray is a 77 y.o. female who was referred by Dr. Daniel Nones for evaluation and management of renal artery stenosis. She has known history of poorly controlled hypertension, chronic diastolic heart failure, severe left ventricular hypertrophy on echo, TIA, questionable paroxysmal atrial fibrillation, hyperlipidemia, previous tobacco use and ascending aortic aneurysm. She was recently hospitalized with acute onset of shortness of breath and respiratory distress requiring BiPAP.  Chest x-ray showed pulmonary edema.  She was treated and stabilized and discharged but returned back with similar presentation.  Her blood pressure was 197/113.  Her presentation was consistent with flash pulmonary edema. Due to uncontrolled hypertension and flash pulmonary edema, she underwent renal artery duplex which showed evidence of severe right renal artery stenosis.  Peak velocity was 325 at the ostium. She had a cardiac MRI that showed an EF of 54% with severe asymmetrical septal hypertrophy but no LVOT gradient.  The study was suggestive of hypertrophic cardiomyopathy.  Prior to her hospitalization in January, she reports prolonged history of uncontrolled hypertension requiring at least 3 medications.  Since her most recent hospital discharge, her blood pressure has been well-controlled and if anything, has been on the low side with slight dizziness.  No chest pain or shortness of breath.  She has not required furosemide.   Past Medical History:  Diagnosis Date   A-fib ([place])    Cardiomegaly    Glaucoma    Hypertension     Past Surgical History:  Procedure Laterality Date   no past surgery       Current Outpatient Medications  Medication Sig Dispense Refill    acetaminophen (TYLENOL) 325 MG tablet Take 2 tablets (650 mg total) by mouth every 4 (four) hours as needed for mild pain (or temp > 37.5 C (99.5 F)).     Ascorbic Acid (VITAMIN C) 1000 MG tablet Take 1,000 mg by mouth daily.     aspirin EC 81 MG tablet Take 1 tablet (81 mg total) by mouth daily. Swallow whole. 90 tablet 3   atorvastatin (LIPITOR) 40 MG tablet Take 1 tablet (40 mg total) by mouth every evening. 30 tablet 0   Cholecalciferol (VITAMIN D3 PO) Take 5,000 mcg by mouth daily.      empagliflozin (JARDIANCE) 10 MG TABS tablet Take 1 tablet (10 mg total) by mouth daily. 30 tablet 0   ferrous sulfate 325 (65 FE) MG EC tablet Take 325 mg by mouth daily as needed (cold hands).     fluticasone (FLONASE) 50 MCG/ACT nasal spray Place 2 sprays into both nostrils daily.     furosemide (LASIX) 40 MG tablet Take 1 tablet (40 mg total) by mouth daily as needed for edema or fluid (take if weight gain 2 to 3 lbs i n24 hrs or 5 lbs in 7 days.). 30 tablet 0   latanoprost (XALATAN) 0.005 % ophthalmic solution Place 1 drop into both eyes at bedtime.      sacubitril-valsartan (ENTRESTO) 24-26 MG Take 1 tablet by mouth 2 (two) times daily. 60 tablet 0   spironolactone (ALDACTONE) 25 MG tablet Take 0.5 tablets (12.5 mg total) by mouth daily. 15 tablet 0   carvedilol (COREG) 6.25 MG tablet Take 1 tablet (6.25 mg total) by mouth  2 (two) times daily. 180 tablet 1   No current facility-administered medications for this visit.    Allergies:   Penicillins    Social History:  The patient  reports that she has quit smoking. Her smoking use included cigarettes. She has a 7.50 pack-year smoking history. She has never used smokeless tobacco. She reports current alcohol use of about 1.0 - 2.0 standard drink of alcohol per week. She reports that she does not use drugs.   Family History:  The patient's family history includes Allergic rhinitis in her son.    ROS:  Please see the history of present illness.    Otherwise, review of systems are positive for none.   All other systems are reviewed and negative.    PHYSICAL EXAM: VS:  BP 94/82   Pulse 62   Ht 5' 3"$  (1.6 m)   Wt 125 lb 9.6 oz (57 kg)   SpO2 100%   BMI 22.25 kg/m  , BMI Body mass index is 22.25 kg/m. GEN: Well nourished, well developed, in no acute distress  HEENT: normal  Neck: no JVD, carotid bruits, or masses Cardiac: RRR; no murmurs, rubs, or gallops,no edema  Respiratory:  clear to auscultation bilaterally, normal work of breathing GI: soft, nontender, nondistended, + BS MS: no deformity or atrophy  Skin: warm and dry, no rash Neuro:  Strength and sensation are intact Psych: euthymic mood, full affect   EKG:  EKG is not ordered today.    Recent Labs: 12/07/2022: ALT 50; B Natriuretic Peptide 420.9; TSH 0.319 12/08/2022: Hemoglobin 12.4; Platelets 146 12/10/2022: BUN 26; Creatinine, Ser 1.03; Potassium 4.3; Sodium 135    Lipid Panel    Component Value Date/Time   CHOL 225 (H) 12/07/2022 1634   TRIG 46 12/07/2022 1634   HDL 73 12/07/2022 1634   CHOLHDL 3.1 12/07/2022 1634   VLDL 9 12/07/2022 1634   LDLCALC 143 (H) 12/07/2022 1634      Wt Readings from Last 3 Encounters:  12/13/22 125 lb 9.6 oz (57 kg)  12/10/22 124 lb 6.4 oz (56.4 kg)  12/03/22 128 lb (58.1 kg)           No data to display            ASSESSMENT AND PLAN:  1.  Renal artery stenosis: The patient has evidence of severe right renal artery stenosis.  I discussed with her the indications for revascularization.  Given her recent presentation with 2 episodes of flash pulmonary edema, I do think she benefits from right renal artery angioplasty/stenting.  Her blood pressure improved with current medications and if anything blood pressure has been on the low side.  I decreased carvedilol to 6.25 mg twice daily.  Continue Entresto and spironolactone. Add aspirin 81 mg once daily. I discussed the procedure in details as well as risk and  benefits.  2.  Chronic diastolic heart failure: She appears to be euvolemic without any diuretics.  It is possible that recent episodes of flash pulmonary edema related to renal artery stenosis.  3.  Paroxysmal atrial fibrillation: She is currently not on anticoagulation and has a ZIO monitor in place.    Disposition:   Proceed with renal artery angiography and follow-up after.   Signed,  Kathlyn Sacramento, MD  12/14/2022 4:26 PM    [facility]

## 2022-12-13 NOTE — H&P (View-Only) (Signed)
Cardiology Office Note   Date:  12/14/2022   ID:  Zaydie Irons, DOB May 23, 1946, MRN [id number]  PCP:  Benito Mccreedy, MD  Cardiologist:   Kathlyn Sacramento, MD   No chief complaint on file.     History of Present Illness: Brooke Gray is a 77 y.o. female who was referred by Dr. Daniel Nones for evaluation and management of renal artery stenosis. She has known history of poorly controlled hypertension, chronic diastolic heart failure, severe left ventricular hypertrophy on echo, TIA, questionable paroxysmal atrial fibrillation, hyperlipidemia, previous tobacco use and ascending aortic aneurysm. She was recently hospitalized with acute onset of shortness of breath and respiratory distress requiring BiPAP.  Chest x-ray showed pulmonary edema.  She was treated and stabilized and discharged but returned back with similar presentation.  Her blood pressure was 197/113.  Her presentation was consistent with flash pulmonary edema. Due to uncontrolled hypertension and flash pulmonary edema, she underwent renal artery duplex which showed evidence of severe right renal artery stenosis.  Peak velocity was 325 at the ostium. She had a cardiac MRI that showed an EF of 54% with severe asymmetrical septal hypertrophy but no LVOT gradient.  The study was suggestive of hypertrophic cardiomyopathy.  Prior to her hospitalization in January, she reports prolonged history of uncontrolled hypertension requiring at least 3 medications.  Since her most recent hospital discharge, her blood pressure has been well-controlled and if anything, has been on the low side with slight dizziness.  No chest pain or shortness of breath.  She has not required furosemide.   Past Medical History:  Diagnosis Date   A-fib ([place])    Cardiomegaly    Glaucoma    Hypertension     Past Surgical History:  Procedure Laterality Date   no past surgery       Current Outpatient Medications  Medication Sig Dispense Refill    acetaminophen (TYLENOL) 325 MG tablet Take 2 tablets (650 mg total) by mouth every 4 (four) hours as needed for mild pain (or temp > 37.5 C (99.5 F)).     Ascorbic Acid (VITAMIN C) 1000 MG tablet Take 1,000 mg by mouth daily.     aspirin EC 81 MG tablet Take 1 tablet (81 mg total) by mouth daily. Swallow whole. 90 tablet 3   atorvastatin (LIPITOR) 40 MG tablet Take 1 tablet (40 mg total) by mouth every evening. 30 tablet 0   Cholecalciferol (VITAMIN D3 PO) Take 5,000 mcg by mouth daily.      empagliflozin (JARDIANCE) 10 MG TABS tablet Take 1 tablet (10 mg total) by mouth daily. 30 tablet 0   ferrous sulfate 325 (65 FE) MG EC tablet Take 325 mg by mouth daily as needed (cold hands).     fluticasone (FLONASE) 50 MCG/ACT nasal spray Place 2 sprays into both nostrils daily.     furosemide (LASIX) 40 MG tablet Take 1 tablet (40 mg total) by mouth daily as needed for edema or fluid (take if weight gain 2 to 3 lbs i n24 hrs or 5 lbs in 7 days.). 30 tablet 0   latanoprost (XALATAN) 0.005 % ophthalmic solution Place 1 drop into both eyes at bedtime.      sacubitril-valsartan (ENTRESTO) 24-26 MG Take 1 tablet by mouth 2 (two) times daily. 60 tablet 0   spironolactone (ALDACTONE) 25 MG tablet Take 0.5 tablets (12.5 mg total) by mouth daily. 15 tablet 0   carvedilol (COREG) 6.25 MG tablet Take 1 tablet (6.25 mg total) by mouth  2 (two) times daily. 180 tablet 1   No current facility-administered medications for this visit.    Allergies:   Penicillins    Social History:  The patient  reports that she has quit smoking. Her smoking use included cigarettes. She has a 7.50 pack-year smoking history. She has never used smokeless tobacco. She reports current alcohol use of about 1.0 - 2.0 standard drink of alcohol per week. She reports that she does not use drugs.   Family History:  The patient's family history includes Allergic rhinitis in her son.    ROS:  Please see the history of present illness.    Otherwise, review of systems are positive for none.   All other systems are reviewed and negative.    PHYSICAL EXAM: VS:  BP 94/82   Pulse 62   Ht 5' 3"$  (1.6 m)   Wt 125 lb 9.6 oz (57 kg)   SpO2 100%   BMI 22.25 kg/m  , BMI Body mass index is 22.25 kg/m. GEN: Well nourished, well developed, in no acute distress  HEENT: normal  Neck: no JVD, carotid bruits, or masses Cardiac: RRR; no murmurs, rubs, or gallops,no edema  Respiratory:  clear to auscultation bilaterally, normal work of breathing GI: soft, nontender, nondistended, + BS MS: no deformity or atrophy  Skin: warm and dry, no rash Neuro:  Strength and sensation are intact Psych: euthymic mood, full affect   EKG:  EKG is not ordered today.    Recent Labs: 12/07/2022: ALT 50; B Natriuretic Peptide 420.9; TSH 0.319 12/08/2022: Hemoglobin 12.4; Platelets 146 12/10/2022: BUN 26; Creatinine, Ser 1.03; Potassium 4.3; Sodium 135    Lipid Panel    Component Value Date/Time   CHOL 225 (H) 12/07/2022 1634   TRIG 46 12/07/2022 1634   HDL 73 12/07/2022 1634   CHOLHDL 3.1 12/07/2022 1634   VLDL 9 12/07/2022 1634   LDLCALC 143 (H) 12/07/2022 1634      Wt Readings from Last 3 Encounters:  12/13/22 125 lb 9.6 oz (57 kg)  12/10/22 124 lb 6.4 oz (56.4 kg)  12/03/22 128 lb (58.1 kg)           No data to display            ASSESSMENT AND PLAN:  1.  Renal artery stenosis: The patient has evidence of severe right renal artery stenosis.  I discussed with her the indications for revascularization.  Given her recent presentation with 2 episodes of flash pulmonary edema, I do think she benefits from right renal artery angioplasty/stenting.  Her blood pressure improved with current medications and if anything blood pressure has been on the low side.  I decreased carvedilol to 6.25 mg twice daily.  Continue Entresto and spironolactone. Add aspirin 81 mg once daily. I discussed the procedure in details as well as risk and  benefits.  2.  Chronic diastolic heart failure: She appears to be euvolemic without any diuretics.  It is possible that recent episodes of flash pulmonary edema related to renal artery stenosis.  3.  Paroxysmal atrial fibrillation: She is currently not on anticoagulation and has a ZIO monitor in place.    Disposition:   Proceed with renal artery angiography and follow-up after.   Signed,  Kathlyn Sacramento, MD  12/14/2022 4:26 PM    [facility]

## 2022-12-14 ENCOUNTER — Telehealth: Payer: Self-pay | Admitting: Cardiovascular Disease

## 2022-12-14 NOTE — Telephone Encounter (Signed)
Left a message for the pt to call back.  

## 2022-12-14 NOTE — Telephone Encounter (Signed)
Pt c/o medication issue:  1. Name of Medication: carvedilol (COREG) 6.25 MG tablet  sacubitril-valsartan (ENTRESTO) 24-26 MG   2. How are you currently taking this medication (dosage and times per day)? currently cutting 12.5 mg carvedilol tablets in half and taking it twice a day. Did not take entresto last night, but has taken it this morning.   3. Are you having a reaction (difficulty breathing--STAT)? No   4. What is your medication issue? Carvedilol was sent to the wrong pharmacy. She is requesting it go to [facility]. BP was taken while on the phone that read 121/83. She is wanting to know if the entresto should be decreased as well due to this.    Patient is also requesting the sooner date for her procedure that was offered if still available. The work number is best for callback until 2:00 pm today.

## 2022-12-15 LAB — ALDOSTERONE + RENIN ACTIVITY W/ RATIO
ALDO / PRA Ratio: >9 (ref 0.0–30.0)
Aldosterone: 1.5 ng/dL (ref 0.0–30.0)
PRA LC/MS/MS: <0.167 ng/mL/hr — ABNORMAL LOW (ref 0.167–5.380)

## 2022-12-15 NOTE — Telephone Encounter (Signed)
LMTCB

## 2022-12-15 NOTE — Telephone Encounter (Signed)
Pt returning nurse's call. Please advise

## 2022-12-16 ENCOUNTER — Encounter: Payer: Self-pay | Admitting: Emergency Medicine

## 2022-12-16 LAB — METANEPHRINES, PLASMA
Metanephrine, Free: 33.2 pg/mL (ref 0.0–88.0)
Normetanephrine, Free: 58.2 pg/mL (ref 0.0–285.2)

## 2022-12-16 MED ORDER — FERROUS SULFATE 325 (65 FE) MG PO TBEC: 325.0000 mg | DELAYED_RELEASE_TABLET | Freq: Every day | ORAL | 3 refills | Status: DC | PRN

## 2022-12-16 NOTE — Telephone Encounter (Signed)
Called patient to follow up on medications. Patient reports that her medications have been switched. I changed her preferred pharmacy to [facility] in [place]. She asked if a refill could be sent in for her ferrous sulfate- it is for her cold hands as needed. Prescription sent in.

## 2022-12-16 NOTE — Telephone Encounter (Signed)
Called patient back and let her know that she will need to message her PCP provider to send in a refill of her ferrous sulfate. She verbalized understanding.  Order for prescription cancelled

## 2022-12-16 NOTE — Progress Notes (Signed)
ADVANCED HF CLINIC CONSULT NOTE  Referring Physician: Benito Mccreedy, MD Primary Care: Benito Mccreedy, MD Primary Cardiologist: None  HPI: 77 y/o female w/ h/o poorly controlled HTN, chronic diastolic dysfunction w/ severe LVH by echocardiogram, TIA, PAF, bradycardia, HLD, remote tobacco use and ascending aortic aneurysm.    Admitted w/ TIA 11/2019. BP noted to be markedly elevated. 2D echo showed normal LVEF 60-65% + severe LVH w/ mild speckled appearance, GIDD and normal RV. Wore heart monitor after discharge which detected afib. Was referred to Afib clinic and recommended to start [place]. Started Eliquis. Noted intolerance to high dose AVN blockers given bradycardia w/ occasional HRs in the 30s-40s. Unfortunately lost to f/u and not seen by [facility] since 12/2019. Per Care Everywhere, was seen at [facility] 6/21 but no f/u since. Says she is now followed by [facility]. She has been on antihypertensives and diuretic therapy w/ torsemide.    Recently seen in the [facility] ED on 12/03/22 for acute dyspnea and respiratory distress, required BiPAP. Chest CT negative for PE. CXR showed pulmonary edema. BNP elevated 407. HS trop mildly elevated w/ flat trend, 237>>235. BP was elevated but not markedly high, 159/93. She was given dose of IV Lasix w/ good UOP and symptomatic response. Able to wean off BiPAP back to room air. Monitored in ED x 3 hrs post treatment w/ stable O2 sats and was discharged home.   Now presents back to ED via EMS w/ recurrent respiratory distress. EMS reports on arrival, patient's O2 sat was 52% on room air. Patient was placed on NRB w/ slight improvement. Given albuterol nebs, epinephrine, Solu-Medrol and magnesium. In ED, required BiPAP. BP elevated 197/113. Concern for recurrent flash pulmonary edema. Placed on NTG gtt and given 80 mg IV Lasix. BNP 420. CXR w/ trace bilateral pleural effusions + findings concerning for progressive severe multilobar  bilateral pneumonia. Respiratory panel negative. WBC nl, 7.8. PCT 0.21. Started on Levaquin. Blood cx pending. EKG NSR 61 bpm w/ LVH. Na 140, Kg 4.2, CO2 26, Scr 1.03 (b/l ~0.80), AST 60, ALT 50. Tbili 0.6.  Hs trop 320.    She is being admitted by IM. AHF team consulted to assist w/ CHF. Echo pending.    Reports good urinary response to 1st dose of IV Lasix. Breathing improved. Now comfortable on RA. Denies CP. Reports full med compliance. No longer taking Eliquis. She claims the cardiologist at [facility] told her she did not have Afib.    Denies FH of CHF. No longer smokes. Works as Gaffer. + history of snoring.      Heart Monitor 12/2019>>mostly SR at 55 bpm, episodes of afib 2/22 and 2/23 with v rates of 160-180 bpm. 2 runs of VT of 4-5 beats.    2D Echo 11/2019 Left ventricular ejection fraction, by visual estimation, is 60 to  65%. The left ventricle has normal function. There is severely increased  left ventricular hypertrophy.   2. Elevated left atrial pressure.   3. Left ventricular diastolic parameters are consistent with Grade I  diastolic dysfunction (impaired relaxation).   4. Global right ventricle has normal systolic function.The right  ventricular size is normal.   5. Left atrial size was moderately dilated.   6. Right atrial size was normal.   7. Trivial pericardial effusion is present.   8. The mitral valve is normal in structure. Mild mitral valve  regurgitation. No evidence of mitral stenosis.   9. The tricuspid valve is normal in  structure.  10. The aortic valve is tricuspid. Aortic valve regurgitation is not  visualized. No evidence of aortic valve sclerosis or stenosis.  11. The pulmonic valve was normal in structure. Pulmonic valve  regurgitation is not visualized.  12. Aortic dilatation noted.  13. There is mild dilatation of the ascending aorta measuring 38 mm.  14. The inferior vena cava is normal in size with greater than 50%  respiratory variability,  suggesting right atrial pressure of 3 mmHg.  15. Normal LV systolic function; grade 1 diastolic dysfunction; severe LVH  (mild speckled appearance; suggest cardiac MRI to R/O HCM or amyloid);  mildly dilated ascending aorta; moderate LAE.  16. The left ventricle has no regional wall motion abnormalities.   FINDINGS   Left Ventricle: Left ventricular ejection fraction, by visual estimation,  is 60 to 65%. The left ventricle has normal function. The left ventricle  has no regional wall motion abnormalities. There is severely increased  left ventricular hypertrophy. Left  ventricular diastolic parameters are consistent with Grade I diastolic  dysfunction (impaired relaxation). Elevated left atrial pressure.         Review of Systems: [y] = yes, [ ]  = no   General: Weight gain [ ] ; Weight loss [ ] ; Anorexia [ ] ; Fatigue [ ] ; Fever [ ] ; Chills [ ] ; Weakness [ ]   Cardiac: Chest pain/pressure [ ] ; Resting SOB [ ] ; Exertional SOB [ ] ; Orthopnea [ ] ; Pedal Edema [ ] ; Palpitations [ ] ; Syncope [ ] ; Presyncope [ ] ; Paroxysmal nocturnal dyspnea[ ]   Pulmonary: Cough [ ] ; Wheezing[ ] ; Hemoptysis[ ] ; Sputum [ ] ; Snoring [ ]   GI: Vomiting[ ] ; Dysphagia[ ] ; Melena[ ] ; Hematochezia [ ] ; Heartburn[ ] ; Abdominal pain [ ] ; Constipation [ ] ; Diarrhea [ ] ; BRBPR [ ]   GU: Hematuria[ ] ; Dysuria [ ] ; Nocturia[ ]   Vascular: Pain in legs with walking [ ] ; Pain in feet with lying flat [ ] ; Non-healing sores [ ] ; Stroke [ ] ; TIA [ ] ; Slurred speech [ ] ;  Neuro: Headaches[ ] ; Vertigo[ ] ; Seizures[ ] ; Paresthesias[ ] ;Blurred vision [ ] ; Diplopia [ ] ; Vision changes [ ]   Ortho/Skin: Arthritis [ ] ; Joint pain [ ] ; Muscle pain [ ] ; Joint swelling [ ] ; Back Pain [ ] ; Rash [ ]   Psych: Depression[ ] ; Anxiety[ ]   Heme: Bleeding problems [ ] ; Clotting disorders [ ] ; Anemia [ ]   Endocrine: Diabetes [ ] ; Thyroid dysfunction[ ]    Past Medical History:  Diagnosis Date   A-fib ([facility])    Cardiomegaly    Glaucoma     Hypertension     Current Outpatient Medications  Medication Sig Dispense Refill   acetaminophen (TYLENOL) 325 MG tablet Take 2 tablets (650 mg total) by mouth every 4 (four) hours as needed for mild pain (or temp > 37.5 C (99.5 F)).     Ascorbic Acid (VITAMIN C) 1000 MG tablet Take 1,000 mg by mouth daily.     aspirin EC 81 MG tablet Take 1 tablet (81 mg total) by mouth daily. Swallow whole. 90 tablet 3   atorvastatin (LIPITOR) 40 MG tablet Take 1 tablet (40 mg total) by mouth every evening. 30 tablet 0   carvedilol (COREG) 6.25 MG tablet Take 1 tablet (6.25 mg total) by mouth 2 (two) times daily. 180 tablet 1   Cholecalciferol (VITAMIN D3 PO) Take 5,000 mcg by mouth daily.      empagliflozin (JARDIANCE) 10 MG TABS tablet Take 1 tablet (10 mg total) by mouth daily. 30 tablet  0   ferrous sulfate 325 (65 FE) MG EC tablet Take 325 mg by mouth daily as needed (cold hands).     fluticasone (FLONASE) 50 MCG/ACT nasal spray Place 2 sprays into both nostrils daily.     furosemide (LASIX) 40 MG tablet Take 1 tablet (40 mg total) by mouth daily as needed for edema or fluid (take if weight gain 2 to 3 lbs i n24 hrs or 5 lbs in 7 days.). 30 tablet 0   latanoprost (XALATAN) 0.005 % ophthalmic solution Place 1 drop into both eyes at bedtime.      sacubitril-valsartan (ENTRESTO) 24-26 MG Take 1 tablet by mouth 2 (two) times daily. 60 tablet 0   spironolactone (ALDACTONE) 25 MG tablet Take 0.5 tablets (12.5 mg total) by mouth daily. 15 tablet 0   No current facility-administered medications for this visit.    Allergies  Allergen Reactions   Penicillins Hives      Social History   Socioeconomic History   Marital status: Married    Spouse name: Not on file   Number of children: Not on file   Years of education: Not on file   Highest education level: Not on file  Occupational History   Not on file  Tobacco Use   Smoking status: Former    Packs/day: 0.25    Years: 30.00    Total pack years:  7.50    Types: Cigarettes   Smokeless tobacco: Never  Vaping Use   Vaping Use: Never used  Substance and Sexual Activity   Alcohol use: Yes    Alcohol/week: 1.0 - 2.0 standard drink of alcohol    Types: 1 - 2 Glasses of wine per week    Comment: occasional   Drug use: No   Sexual activity: Not on file  Other Topics Concern   Not on file  Social History Narrative   Not on file   Social Determinants of Health   Financial Resource Strain: Not on file  Food Insecurity: No Food Insecurity (12/08/2022)   Hunger Vital Sign    Worried About Running Out of Food in the Last Year: Never true    Ran Out of Food in the Last Year: Never true  Transportation Needs: No Transportation Needs (12/08/2022)   PRAPARE - Administrator, Civil Service (Medical): No    Lack of Transportation (Non-Medical): No  Physical Activity: Not on file  Stress: Not on file  Social Connections: Not on file  Intimate Partner Violence: Not At Risk (12/08/2022)   Humiliation, Afraid, Rape, and Kick questionnaire    Fear of Current or Ex-Partner: No    Emotionally Abused: No    Physically Abused: No    Sexually Abused: No      Family History  Problem Relation Age of Onset   Allergic rhinitis Son    Angioedema Neg Hx    Asthma Neg Hx    Eczema Neg Hx    Immunodeficiency Neg Hx    Urticaria Neg Hx     There were no vitals filed for this visit.  PHYSICAL EXAM: General:  Well appearing. No respiratory difficulty HEENT: normal Neck: supple. no JVD. Carotids 2+ bilat; no bruits. No lymphadenopathy or thryomegaly appreciated. Cor: PMI nondisplaced. Regular rate & rhythm. No rubs, gallops or murmurs. Lungs: clear Abdomen: soft, nontender, nondistended. No hepatosplenomegaly. No bruits or masses. Good bowel sounds. Extremities: no cyanosis, clubbing, rash, edema Neuro: alert & oriented x 3, cranial nerves grossly intact. moves all  4 extremities w/o difficulty. Affect  pleasant.  ECG:   ASSESSMENT & PLAN: 1. Acute CHF/Flash Pulmonary Edema - h/o diastolic heart failure, Echo 2021 EF 60-65%, severe LVH w/ mild speckled appearance - multiple presentations for flash pulmonary edema in the last wk  - BP improved w/ NGT gtt>>transition to GDMT - continue IV Lasix 40 mg bid  - Echo yesterday EF 60-65%. Severe LVH. RV normal, trivial MR, mild TR - suspect hypertensive CM, though cannot r/o infiltrative CM (prior echo w/ speckled myocardium). cMRI ordered - Continue Entresto 49-51mg  bid (starting today) - Start spiro 12.5 - Start Jardiance 10 mg daily - continue home Coreg  - may need eventual Bidil    2. Hypertension / Hypertensive Emergency  - 197/113 in ED >>improved w/ NTG gtt, now weaned off - long h/o poor control, despite reported med compliance - multiple presentations for flash pulmonary edema - check renal artery dopplers to r/o RAS - R/o other secondary causes, check serum metanepherines, rena:aldosterone ratio  - GDMT titration per above - reports h/o snoring, will need outpatient sleep study    3. PAF - h/o TIA in 2001 w/ afib detection on Holter monitor  - in NSR on EKG on admission, remains in NSR  - recommend restarting a/c for secondary prevention  - continue Coreg for rate control    4. ? PNA - CXR suggestive of possible PNA, PCT 0.21. Respiratory panel negative  - got dose of levaquin yesterday   5. Elevated Hs Trop  - 320, denies CP. Suspect demand ischemia from hypertensive urgency/ CHF  - EF stable on yesterdays echo   6. AKI  - b/l SCr ~0.8 - 1.03 on admit. .94 today - improving with diuresis   7. Hyperlipidemia - LDL 143, continue statin    [facility]

## 2022-12-16 NOTE — Telephone Encounter (Signed)
Patient has been called and would like to move her procedure up to 2/14. Procedure has been moved up. Patient has been instructed to refer to her instructions as they still apply except for the new date on 2/14.

## 2022-12-16 NOTE — Telephone Encounter (Signed)
LMTCB

## 2022-12-16 NOTE — Telephone Encounter (Signed)
Entresto dose should stay the same.  We did have a cancellation on 2/7 and thus can be done that day or February 14.

## 2022-12-16 NOTE — Addendum Note (Signed)
Addended by: Sharee Holster on: 12/16/2022 04:46 PM   Modules accepted: Orders

## 2022-12-19 ENCOUNTER — Encounter (HOSPITAL_COMMUNITY): Payer: Self-pay

## 2022-12-19 ENCOUNTER — Ambulatory Visit (HOSPITAL_COMMUNITY)
Admit: 2022-12-19 | Discharge: 2022-12-19 | Disposition: A | Discharge status: Home / Self Care | Payer: Medicare HMO | Source: Ambulatory Visit | Attending: Family Medicine | Admitting: Family Medicine

## 2022-12-19 VITALS — BP 100/64 | HR 55 | Wt 128.2 lb

## 2022-12-19 DIAGNOSIS — Z7984 Long term (current) use of oral hypoglycemic drugs: Secondary | ICD-10-CM | POA: Diagnosis not present

## 2022-12-19 DIAGNOSIS — I7121 Aneurysm of the ascending aorta, without rupture: Secondary | ICD-10-CM | POA: Insufficient documentation

## 2022-12-19 DIAGNOSIS — R001 Bradycardia, unspecified: Secondary | ICD-10-CM | POA: Diagnosis not present

## 2022-12-19 DIAGNOSIS — E785 Hyperlipidemia, unspecified: Secondary | ICD-10-CM | POA: Diagnosis not present

## 2022-12-19 DIAGNOSIS — I150 Renovascular hypertension: Secondary | ICD-10-CM | POA: Diagnosis not present

## 2022-12-19 DIAGNOSIS — R7989 Other specified abnormal findings of blood chemistry: Secondary | ICD-10-CM | POA: Diagnosis not present

## 2022-12-19 DIAGNOSIS — Z7982 Long term (current) use of aspirin: Secondary | ICD-10-CM | POA: Insufficient documentation

## 2022-12-19 DIAGNOSIS — Z79899 Other long term (current) drug therapy: Secondary | ICD-10-CM | POA: Insufficient documentation

## 2022-12-19 DIAGNOSIS — Z8673 Personal history of transient ischemic attack (TIA), and cerebral infarction without residual deficits: Secondary | ICD-10-CM | POA: Diagnosis not present

## 2022-12-19 DIAGNOSIS — Z7901 Long term (current) use of anticoagulants: Secondary | ICD-10-CM | POA: Insufficient documentation

## 2022-12-19 DIAGNOSIS — I110 Hypertensive heart disease with heart failure: Secondary | ICD-10-CM | POA: Diagnosis not present

## 2022-12-19 DIAGNOSIS — I472 Ventricular tachycardia, unspecified: Secondary | ICD-10-CM | POA: Diagnosis not present

## 2022-12-19 DIAGNOSIS — Z87891 Personal history of nicotine dependence: Secondary | ICD-10-CM | POA: Diagnosis not present

## 2022-12-19 DIAGNOSIS — I160 Hypertensive urgency: Secondary | ICD-10-CM | POA: Insufficient documentation

## 2022-12-19 DIAGNOSIS — R0683 Snoring: Secondary | ICD-10-CM | POA: Diagnosis not present

## 2022-12-19 DIAGNOSIS — I480 Paroxysmal atrial fibrillation: Secondary | ICD-10-CM | POA: Insufficient documentation

## 2022-12-19 DIAGNOSIS — I701 Atherosclerosis of renal artery: Secondary | ICD-10-CM | POA: Diagnosis not present

## 2022-12-19 DIAGNOSIS — I5032 Chronic diastolic (congestive) heart failure: Secondary | ICD-10-CM | POA: Diagnosis present

## 2022-12-19 LAB — BASIC METABOLIC PANEL
Anion gap: 8 (ref 5–15)
BUN: 14 mg/dL (ref 8–23)
CO2: 26 mmol/L (ref 22–32)
Calcium: 9.6 mg/dL (ref 8.9–10.3)
Chloride: 104 mmol/L (ref 98–111)
Creatinine, Ser: 0.84 mg/dL (ref 0.44–1.00)
GFR, Estimated: >60 mL/min (ref >60)
Glucose, Bld: 125 mg/dL — ABNORMAL HIGH (ref 70–99)
Potassium: 3.9 mmol/L (ref 3.5–5.1)
Sodium: 138 mmol/L (ref 135–145)

## 2022-12-19 LAB — BRAIN NATRIURETIC PEPTIDE: B Natriuretic Peptide: 263.9 pg/mL — ABNORMAL HIGH (ref 0.0–100.0)

## 2022-12-19 MED ORDER — CARVEDILOL 3.125 MG PO TABS: 3.1250 mg | ORAL_TABLET | Freq: Two times a day (BID) | ORAL | 3 refills | Status: DC

## 2022-12-19 NOTE — Addendum Note (Signed)
Addended by: Ricci Barker on: 12/19/2022 08:46 AM   Modules accepted: Orders

## 2022-12-19 NOTE — Patient Instructions (Addendum)
Medication Changes:  CHANGE: decrease coreg (3.125mg ). Take one tablet, two times a day.  Lab Work:  Labs done today, your results will be available in MyChart, we will contact you for abnormal readings.  Testing/Procedures:  You have been ordered a PYP Scan.  This is done in the Radiology Department of [facility].  When you come for this test please plan to be there 2-3 hours. The radiology department will call you to schedule your appointment once approved by your insurance.    Your provider has recommended that you have a home sleep study ([facility] Test).  We have provided you with the equipment in our office today. Please go ahead and download the app.  Once insurance has approved the test our office will call you with PIN number and approval to proceed with testing. Once you have completed the test you just dispose of the equipment, the information is automatically uploaded to [place] via blue-tooth technology. If your test is positive for sleep apnea and you need a home CPAP machine you will be contacted by Dr Theodosia Blender office [facility]) to set this up.    Special Instructions // Education:  Do the following things EVERYDAY: Weigh yourself in the morning before breakfast. Write it down and keep it in a log. Take your medicines as prescribed Eat low salt foods--Limit salt (sodium) to 2000 mg per day.  Stay as active as you can everyday Limit all fluids for the day to less than 2 liters  Follow-Up in: 6-8 weeks with Dr. Daniel Nones. Please call the clinic to schedule your appointment   At the [facility], you and your health needs are our priority. We have a designated team specialized in the treatment of Heart Failure. This Care Team includes your primary Heart Failure Specialized Cardiologist (physician), Advanced Practice Providers (APPs- Physician Assistants and Nurse Practitioners), and Pharmacist who all work together to provide you with the care you need,  when you need it.   You may see any of the following providers on your designated Care Team at your next follow up:  Dr. Glori Bickers Dr. Loralie Champagne Dr. Roxana Hires, NP Lyda Jester, [place] [facility] [place], [place] Forestine Na, NP Audry Riles, PharmD   Please be sure to bring in all your medications bottles to every appointment.   Need to Contact us:  If you have any questions or concerns before your next appointment please send [place] a message through [place] or call our office at [phone].    TO LEAVE A MESSAGE FOR THE NURSE SELECT OPTION 2, PLEASE LEAVE A MESSAGE INCLUDING: YOUR NAME DATE OF BIRTH CALL BACK NUMBER REASON FOR CALL**this is important as we prioritize the call backs  YOU WILL RECEIVE A CALL BACK THE SAME DAY AS LONG AS YOU CALL BEFORE 4:00 PM

## 2022-12-19 NOTE — Progress Notes (Signed)
Patient Name: Brooke Gray        DOB: [birth year]      Height: [place]  Office Name: [facility]         Referring Provider: Dr. Daniel Nones  Today's Date: 12/19/22  Date:  12/19/22 STOP BANG RISK ASSESSMENT S (snore) Have you been told that you snore?     YES   T (tired) Are you often tired, fatigued, or sleepy during the day?   NO  O (obstruction) Do you stop breathing, choke, or gasp during sleep? NO   P (pressure) Do you have or are you being treated for high blood pressure? YES   B (BMI) Is your body index greater than 35 kg/m? NO   A (age) Are you 75 years old or older? YES   N (neck) Do you have a neck circumference greater than 16 inches?   YES/NO   G (gender) Are you a female? NO   TOTAL STOP/BANG "YES" ANSWERS 3                                                                       For Office Use Only              Procedure Order Form    YES to 3+ Stop Bang questions OR two clinical symptoms - patient qualifies for WatchPAT (CPT 95800)             Clinical Notes: Will consult Sleep Specialist and refer for management of therapy due to patient increased risk of Sleep Apnea. Ordering a sleep study due to the following two clinical symptoms: Excessive daytime sleepiness G47.10 / Gastroesophageal reflux K21.9 / Nocturia R35.1 / Morning Headaches G44.221 / Difficulty concentrating R41.840 / Memory problems or poor judgment G31.84 / Personality changes or irritability R45.4 / Loud snoring R06.83 / Depression F32.9 / Unrefreshed by sleep G47.8 / Impotence N52.9 / History of high blood pressure R03.0 / Insomnia G47.00    I understand that I am proceeding with a home sleep apnea test as ordered by my treating physician. I understand that untreated sleep apnea is a serious cardiovascular risk factor and it is my responsibility to perform the test and seek management for sleep apnea. I will be contacted with the results and be managed for sleep apnea  by a local sleep physician. I will be receiving equipment and further instructions from [facility]. I shall promptly ship back the equipment via the included mailing label. I understand my insurance will be billed for the test and as the patient I am responsible for any insurance related out-of-pocket costs incurred. I have been provided with written instructions and can call for additional video or telephonic instruction, with 24-hour availability of qualified personnel to answer any questions: Patient Help Desk [phone].  Patient Signature ______________________________________________________   Date______________________ Patient Telemedicine Verbal Consent

## 2022-12-22 ENCOUNTER — Encounter (HOSPITAL_COMMUNITY): Payer: Self-pay | Admitting: *Deleted

## 2022-12-23 ENCOUNTER — Telehealth (HOSPITAL_COMMUNITY): Payer: Self-pay | Admitting: *Deleted

## 2022-12-23 NOTE — Telephone Encounter (Signed)
No pre cert reqd for pyp

## 2022-12-26 ENCOUNTER — Telehealth: Payer: Self-pay | Admitting: Cardiovascular Disease

## 2022-12-26 NOTE — Telephone Encounter (Signed)
Patient wants a call back to discuss upcoming procedure, anesthesia and medications.

## 2022-12-26 NOTE — Telephone Encounter (Signed)
Contacted patient, she had a long list of questions in regards to her upcoming procedure, of what medications are used during the procedures, and more in depth questions.   I advised I would route to MD/RN. Thanks!

## 2022-12-27 ENCOUNTER — Telehealth: Payer: Self-pay | Admitting: *Deleted

## 2022-12-27 NOTE — Telephone Encounter (Signed)
Called and spoke with the patient. All questions have been answered concerning her procedure for tomorrow.

## 2022-12-27 NOTE — Telephone Encounter (Signed)
Renal Angiography scheduled at [facility] for: Wednesday December 28, 2022 8:30 AM Arrival time and place: [place] Entrance A at: 6:30 AM  Nothing to eat after midnight prior to procedure, clear liquids until 5 AM day of procedure.  Medication instructions: -Hold:  Lasix/Spironolactone/Jardiance-AM of procedure  -Other usual morning medications can be taken with sips of water including aspirin 81 mg.  Confirmed patient has responsible adult to drive home post procedure and be with patient first 24 hours after arriving home.  Patient reports no new symptoms concerning for COVID-19 in the past 10 days.  Reviewed procedure instructions with patient.

## 2022-12-28 ENCOUNTER — Ambulatory Visit (HOSPITAL_COMMUNITY)
Admission: RE | Admit: 2022-12-28 | Discharge: 2022-12-28 | Disposition: A | Discharge status: Home / Self Care | Payer: Medicare HMO | Source: Ambulatory Visit | Attending: Cardiovascular Disease | Admitting: Cardiovascular Disease

## 2022-12-28 ENCOUNTER — Encounter (HOSPITAL_COMMUNITY)
Admission: RE | Disposition: A | Discharge status: Home / Self Care | Payer: Medicare HMO | Source: Ambulatory Visit | Attending: Cardiovascular Disease

## 2022-12-28 ENCOUNTER — Other Ambulatory Visit: Payer: Self-pay | Admitting: *Deleted

## 2022-12-28 ENCOUNTER — Other Ambulatory Visit: Payer: Self-pay

## 2022-12-28 DIAGNOSIS — Z8673 Personal history of transient ischemic attack (TIA), and cerebral infarction without residual deficits: Secondary | ICD-10-CM | POA: Diagnosis not present

## 2022-12-28 DIAGNOSIS — Z79899 Other long term (current) drug therapy: Secondary | ICD-10-CM | POA: Diagnosis not present

## 2022-12-28 DIAGNOSIS — Z87891 Personal history of nicotine dependence: Secondary | ICD-10-CM | POA: Insufficient documentation

## 2022-12-28 DIAGNOSIS — I110 Hypertensive heart disease with heart failure: Secondary | ICD-10-CM | POA: Insufficient documentation

## 2022-12-28 DIAGNOSIS — I480 Paroxysmal atrial fibrillation: Secondary | ICD-10-CM | POA: Diagnosis not present

## 2022-12-28 DIAGNOSIS — I701 Atherosclerosis of renal artery: Secondary | ICD-10-CM

## 2022-12-28 DIAGNOSIS — I714 Abdominal aortic aneurysm, without rupture, unspecified: Secondary | ICD-10-CM | POA: Insufficient documentation

## 2022-12-28 DIAGNOSIS — E785 Hyperlipidemia, unspecified: Secondary | ICD-10-CM | POA: Diagnosis not present

## 2022-12-28 DIAGNOSIS — I5032 Chronic diastolic (congestive) heart failure: Secondary | ICD-10-CM | POA: Insufficient documentation

## 2022-12-28 HISTORY — PX: RENAL ANGIOGRAPHY: CATH118260

## 2022-12-28 HISTORY — PX: RENAL INTERVENTION: CATH118261

## 2022-12-28 SURGERY — RENAL ANGIOGRAPHY
Anesthesia: LOCAL

## 2022-12-28 MED ORDER — IODIXANOL 320 MG/ML IV SOLN
INTRAVENOUS | Status: DC | PRN
  Administered 2022-12-28: 170 mL via INTRA_ARTERIAL

## 2022-12-28 MED ORDER — SODIUM CHLORIDE 0.9 % IV SOLN: INTRAVENOUS | Status: DC

## 2022-12-28 MED ORDER — HYDRALAZINE HCL 20 MG/ML IJ SOLN: 5.0000 mg | INTRAMUSCULAR | Status: DC | PRN

## 2022-12-28 MED ORDER — LIDOCAINE HCL (PF) 1 % IJ SOLN
INTRAMUSCULAR | Status: DC | PRN
  Administered 2022-12-28: 15 mL

## 2022-12-28 MED ORDER — SODIUM CHLORIDE 0.9% FLUSH: 3.0000 mL | INTRAVENOUS | Status: DC | PRN

## 2022-12-28 MED ORDER — SODIUM CHLORIDE 0.9 % IV SOLN: 250.0000 mL | INTRAVENOUS | Status: DC | PRN

## 2022-12-28 MED ORDER — ASPIRIN 81 MG PO CHEW: 81.0000 mg | CHEWABLE_TABLET | ORAL | Status: DC

## 2022-12-28 MED ORDER — MIDAZOLAM HCL 2 MG/2ML IJ SOLN
INTRAMUSCULAR | Status: DC | PRN
  Administered 2022-12-28: 1 mg via INTRAVENOUS

## 2022-12-28 MED ORDER — ONDANSETRON HCL 4 MG/2ML IJ SOLN: 4.0000 mg | Freq: Four times a day (QID) | INTRAMUSCULAR | Status: DC | PRN

## 2022-12-28 MED ORDER — SODIUM CHLORIDE 0.9% FLUSH: 3.0000 mL | Freq: Two times a day (BID) | INTRAVENOUS | Status: DC

## 2022-12-28 MED ORDER — SODIUM CHLORIDE 0.9 % IV SOLN: INTRAVENOUS | Status: AC

## 2022-12-28 MED ORDER — LIDOCAINE HCL (PF) 1 % IJ SOLN
INTRAMUSCULAR | Status: AC
  Filled 2022-12-28: qty 30

## 2022-12-28 MED ORDER — CLOPIDOGREL BISULFATE 300 MG PO TABS
ORAL_TABLET | ORAL | Status: DC | PRN
  Administered 2022-12-28: 300 mg via ORAL

## 2022-12-28 MED ORDER — CLOPIDOGREL BISULFATE 300 MG PO TABS
ORAL_TABLET | ORAL | Status: AC
  Filled 2022-12-28: qty 1

## 2022-12-28 MED ORDER — FENTANYL CITRATE (PF) 100 MCG/2ML IJ SOLN
INTRAMUSCULAR | Status: AC
  Filled 2022-12-28: qty 2

## 2022-12-28 MED ORDER — FENTANYL CITRATE (PF) 100 MCG/2ML IJ SOLN
INTRAMUSCULAR | Status: DC | PRN
  Administered 2022-12-28: 25 ug via INTRAVENOUS

## 2022-12-28 MED ORDER — HEPARIN SODIUM (PORCINE) 1000 UNIT/ML IJ SOLN
INTRAMUSCULAR | Status: AC
  Filled 2022-12-28: qty 10

## 2022-12-28 MED ORDER — ACETAMINOPHEN 325 MG PO TABS: 650.0000 mg | ORAL_TABLET | ORAL | Status: DC | PRN

## 2022-12-28 MED ORDER — MIDAZOLAM HCL 2 MG/2ML IJ SOLN
INTRAMUSCULAR | Status: AC
  Filled 2022-12-28: qty 2

## 2022-12-28 MED ORDER — HEPARIN SODIUM (PORCINE) 1000 UNIT/ML IJ SOLN
INTRAMUSCULAR | Status: DC | PRN
  Administered 2022-12-28: 5000 [IU] via INTRAVENOUS

## 2022-12-28 MED ORDER — CLOPIDOGREL BISULFATE 75 MG PO TABS: 75.0000 mg | ORAL_TABLET | Freq: Every day | ORAL | 3 refills | Status: DC

## 2022-12-28 MED ORDER — HEPARIN (PORCINE) IN NACL 1000-0.9 UT/500ML-% IV SOLN
INTRAVENOUS | Status: DC | PRN
  Administered 2022-12-28: 500 mL
  Administered 2022-12-28: 1000 mL

## 2022-12-28 SURGICAL SUPPLY — 20 items
BALLN VIATRAC 4X15X135 (BALLOONS) ×2
BALLOON VIATRAC 4X15X135 (BALLOONS) IMPLANT
CATH ANGIO 5F PIGTAIL 65CM (CATHETERS) IMPLANT
CATH SOS OMNI O 5F 80CM (CATHETERS) IMPLANT
DEVICE CLOSURE MYNXGRIP 6/7F (Vascular Products) IMPLANT
GUIDE CATH VISTA IMA 6F (CATHETERS) IMPLANT
GUIDE CATH VISTA JR4 6F (CATHETERS) IMPLANT
KIT ENCORE 26 ADVANTAGE (KITS) IMPLANT
KIT MICROPUNCTURE NIT STIFF (SHEATH) IMPLANT
KIT PV (KITS) ×2 IMPLANT
SHEATH PINNACLE 6F 10CM (SHEATH) IMPLANT
SHEATH PROBE COVER 6X72 (BAG) IMPLANT
STENT HERCULINK RX 5.5X15X135 (Permanent Stent) IMPLANT
SYR MEDRAD MARK 7 150ML (SYRINGE) ×2 IMPLANT
TRANSDUCER W/STOPCOCK (MISCELLANEOUS) ×2 IMPLANT
TRAY PV CATH (CUSTOM PROCEDURE TRAY) ×2 IMPLANT
TUBING CIL FLEX 10 FLL-RA (TUBING) IMPLANT
WIRE HI TORQ VERSACORE-J 145CM (WIRE) IMPLANT
WIRE RUNTHROUGH IZANAI 014 300 (WIRE) IMPLANT
WIRE SPARTACORE .014X190CM (WIRE) IMPLANT

## 2022-12-28 NOTE — Interval H&P Note (Signed)
History and Physical Interval Note:  12/28/2022 8:38 AM  Brooke Gray  has presented today for surgery, with the diagnosis of renal stenosis.  The various methods of treatment have been discussed with the patient and family. After consideration of risks, benefits and other options for treatment, the patient has consented to  Procedure(s): RENAL ANGIOGRAPHY (N/A) as a surgical intervention.  The patient's history has been reviewed, patient examined, no change in status, stable for surgery.  I have reviewed the patient's chart and labs.  Questions were answered to the patient's satisfaction.     Kathlyn Sacramento

## 2022-12-28 NOTE — Progress Notes (Signed)
Patient and family was giving dc instructions. Right femoral looks intact, no signs of hematoma.

## 2022-12-29 ENCOUNTER — Encounter (HOSPITAL_COMMUNITY): Payer: Self-pay | Admitting: Cardiovascular Disease

## 2022-12-29 LAB — POCT ACTIVATED CLOTTING TIME: Activated Clotting Time: 255 seconds

## 2023-01-02 MED ORDER — SPIRONOLACTONE 25 MG PO TABS: 12.5000 mg | ORAL_TABLET | Freq: Every day | ORAL | 0 refills | Status: DC

## 2023-01-10 ENCOUNTER — Encounter (HOSPITAL_COMMUNITY): Payer: Medicare HMO

## 2023-01-10 ENCOUNTER — Ambulatory Visit (HOSPITAL_COMMUNITY): Payer: Medicare HMO

## 2023-01-11 ENCOUNTER — Telehealth: Payer: Self-pay | Admitting: Cardiovascular Disease

## 2023-01-11 ENCOUNTER — Encounter: Payer: Self-pay | Admitting: Cardiovascular Disease

## 2023-01-11 NOTE — Telephone Encounter (Addendum)
See phone note 01/11/23

## 2023-01-11 NOTE — Telephone Encounter (Signed)
Patient calling in with question concerns with the medication before ther  procedure. Please advise

## 2023-01-11 NOTE — Telephone Encounter (Signed)
*  STAT* If patient is at the pharmacy, call can be transferred to refill team.   1. Which medications need to be refilled? (please list name of each medication and dose if known)   atorvastatin (LIPITOR) 40 MG tablet (Expired)   2. Which pharmacy/location (including street and city if local pharmacy) is medication to be sent to?  [facility] - [place], [place] - [address]   3. Do they need a 30 day or 90 day supply?   30 day  Patient only has 2 tablets left.

## 2023-01-11 NOTE — Telephone Encounter (Signed)
Yes, that is fine. 

## 2023-01-11 NOTE — Telephone Encounter (Signed)
Pt stated she does not understand why does she have to take so much gas x prior to tomorrow's procedure. Will forward to MD and nurse.

## 2023-01-11 NOTE — Telephone Encounter (Signed)
*  STAT* If patient is at the pharmacy, call can be transferred to refill team.   1. Which medications need to be refilled? (please list name of each medication and dose if known) atorvastatin (LIPITOR) 40 MG tablet (Expired)   2. Which pharmacy/location (including street and city if local pharmacy) is medication to be sent to?  [facility] - [place], [place] - [address]    3. Do they need a 30 day or 90 day supply? [place]

## 2023-01-11 NOTE — Telephone Encounter (Signed)
Patient wants a call back regarding taking Extra Strength Gas-X prior to her test tomorrow.

## 2023-01-12 ENCOUNTER — Ambulatory Visit (HOSPITAL_COMMUNITY)
Admission: RE | Admit: 2023-01-12 | Discharge: 2023-01-12 | Disposition: A | Discharge status: Home / Self Care | Payer: Medicare HMO | Source: Ambulatory Visit | Attending: Internal Medicine | Admitting: Internal Medicine

## 2023-01-12 ENCOUNTER — Other Ambulatory Visit (HOSPITAL_COMMUNITY): Payer: Self-pay

## 2023-01-12 ENCOUNTER — Other Ambulatory Visit: Payer: Self-pay | Admitting: Cardiovascular Disease

## 2023-01-12 DIAGNOSIS — I701 Atherosclerosis of renal artery: Secondary | ICD-10-CM | POA: Insufficient documentation

## 2023-01-12 MED ORDER — ATORVASTATIN CALCIUM 40 MG PO TABS: 40.0000 mg | ORAL_TABLET | Freq: Every evening | ORAL | 5 refills | Status: DC

## 2023-01-12 MED ORDER — FUROSEMIDE 40 MG PO TABS: 40.0000 mg | ORAL_TABLET | Freq: Every day | ORAL | 5 refills | Status: DC | PRN

## 2023-01-12 MED ORDER — EMPAGLIFLOZIN 10 MG PO TABS: 10.0000 mg | ORAL_TABLET | Freq: Every day | ORAL | 2 refills | Status: DC

## 2023-01-12 NOTE — Telephone Encounter (Signed)
Missy,  Are we routinely asking patients to take Gas-X before abdominal/renal duplex?  This patient is complaining about taking too much Gas-X.

## 2023-01-12 NOTE — Telephone Encounter (Signed)
Shardey, Galo - 01/11/2023  2:39 PM Church, Drue Second, RVT, RDCS  Sent: Thu January 12, 2023  8:18 AM  To: Wellington Hampshire, MD; P Cv Div Nl Triage; Ricci Barker, RN         Message  It is on the AVS to take Gas-X.  But when [facility] does patient reminder calls, she does NOT ask them to take it.  Many of the techs says it actually makes the study harder.  But other sites want to keep it on the order set, so we can't remove it, per IT/Epic.

## 2023-01-12 NOTE — Telephone Encounter (Signed)
Patient in the office and requesting refills for cardiac medications. Per chart review, lasix and atorvastatin were the only meds without refills. Rx(s) sent to pharmacy electronically.   She also needs a note about exercise routine/regimen/restrictions to take to her gym/trainer. Deferred to primary cardiologist.

## 2023-01-13 ENCOUNTER — Telehealth: Payer: Self-pay | Admitting: Cardiovascular Disease

## 2023-01-13 NOTE — Addendum Note (Signed)
Addended by: Ricci Barker on: 01/13/2023 11:33 AM   Modules accepted: Orders

## 2023-01-13 NOTE — Telephone Encounter (Signed)
Patient has been made aware and verbalized her understanding.

## 2023-01-13 NOTE — Telephone Encounter (Signed)
Patient is returning phone call.  °

## 2023-01-13 NOTE — Telephone Encounter (Signed)
The patient has been called back and advised to reach out to the HF clinic since she saw them last and they advised to continue the [place]. They have also filled the medication for her.   Dr. Fletcher Anon is her [place] physician.

## 2023-01-13 NOTE — Telephone Encounter (Signed)
I am not the primary cardiologist on this patient.  She is followed by advanced heart failure.

## 2023-01-13 NOTE — Telephone Encounter (Signed)
Pt  is seeking clarification if she should continue taking Jardiance. If so, pt stated she will need her prescription refilled. Will forward to MD and nurse.

## 2023-01-13 NOTE — Telephone Encounter (Signed)
Left a message for the patient to call back.  

## 2023-01-18 ENCOUNTER — Encounter (HOSPITAL_COMMUNITY): Payer: Self-pay | Admitting: Cardiology

## 2023-01-19 ENCOUNTER — Other Ambulatory Visit (HOSPITAL_COMMUNITY): Payer: Self-pay

## 2023-01-19 MED ORDER — ENTRESTO 24-26 MG PO TABS: 1.0000 | ORAL_TABLET | Freq: Two times a day (BID) | ORAL | 3 refills | Status: DC

## 2023-01-19 MED ORDER — CARVEDILOL 3.125 MG PO TABS: 3.1250 mg | ORAL_TABLET | Freq: Two times a day (BID) | ORAL | 3 refills | Status: DC

## 2023-01-24 ENCOUNTER — Ambulatory Visit (INDEPENDENT_AMBULATORY_CARE_PROVIDER_SITE_OTHER): Payer: Medicare HMO | Admitting: Family Medicine

## 2023-01-24 ENCOUNTER — Encounter: Payer: Self-pay | Admitting: Family Medicine

## 2023-01-24 VITALS — BP 110/70 | HR 65 | Temp 98.5°F | Ht 63.0 in | Wt 127.8 lb

## 2023-01-24 DIAGNOSIS — D649 Anemia, unspecified: Secondary | ICD-10-CM | POA: Insufficient documentation

## 2023-01-24 DIAGNOSIS — Z87891 Personal history of nicotine dependence: Secondary | ICD-10-CM

## 2023-01-24 DIAGNOSIS — R7309 Other abnormal glucose: Secondary | ICD-10-CM | POA: Diagnosis not present

## 2023-01-24 DIAGNOSIS — R7989 Other specified abnormal findings of blood chemistry: Secondary | ICD-10-CM | POA: Diagnosis not present

## 2023-01-24 NOTE — Progress Notes (Signed)
Established Patient Office Visit   Subjective:  Patient ID: Brooke Gray, female    DOB: 06/16/1946  Age: 77 y.o. MRN: [id number]  Chief Complaint  Patient presents with   Establish Care    NP. Est. Care concerns with medications     HPI Encounter Diagnoses  Name Primary?   Low TSH level Yes   Elevated glucose    Normocytic anemia    History of tobacco use    For establishment of care and follow-up of recent medical experience.  History of left ventricular hypertrophy diagnosed in 2021 and again more recently this year.  She has preserved ejection fraction.  Renal artery stenosis treated with stenting.  Cardiology is pending.  Active physically.  She is a [employer].  She quit smoking in January of this year.  She says that she is smoking more than 5 cigarettes daily.  No alcohol in 3 months.  Review of chart shows a depressed TSH, elevated glucose measurements, and a normocytic anemia.  She takes iron as needed.  She denies chest pain or shortness of breath.  She sleeps on 3 pillows.  She would like to lay flat.   Review of Systems  Constitutional: Negative.   HENT: Negative.    Eyes:  Negative for blurred vision, discharge and redness.  Respiratory: Negative.  Negative for shortness of breath.   Cardiovascular: Negative.  Negative for chest pain.  Gastrointestinal:  Negative for abdominal pain, blood in stool, constipation and melena.  Genitourinary: Negative.  Negative for hematuria.  Musculoskeletal: Negative.  Negative for myalgias.  Skin:  Negative for rash.  Neurological:  Negative for tingling, loss of consciousness and weakness.  Endo/Heme/Allergies:  Negative for polydipsia.  Psychiatric/Behavioral:  Negative for depression. The patient is not nervous/anxious.      Current Outpatient Medications:    acetaminophen (TYLENOL) 500 MG tablet, Take 500 mg by mouth every 6 (six) hours as needed for mild pain, headache or fever., Disp: , Rfl:    Ascorbic Acid  (VITAMIN C) 1000 MG tablet, Take 1,000 mg by mouth daily., Disp: , Rfl:    aspirin EC 81 MG tablet, Take 1 tablet (81 mg total) by mouth daily. Swallow whole., Disp: 90 tablet, Rfl: 3   atorvastatin (LIPITOR) 40 MG tablet, Take 1 tablet (40 mg total) by mouth every evening., Disp: 30 tablet, Rfl: 5   carvedilol (COREG) 3.125 MG tablet, Take 1 tablet (3.125 mg total) by mouth 2 (two) times daily with a meal., Disp: 30 tablet, Rfl: 3   Cholecalciferol (VITAMIN D3) 125 MCG (5000 UT) TABS, Take 5,000 mcg by mouth daily., Disp: , Rfl:    clopidogrel (PLAVIX) 75 MG tablet, Take 1 tablet (75 mg total) by mouth daily., Disp: 30 tablet, Rfl: 3   empagliflozin (JARDIANCE) 10 MG TABS tablet, Take 1 tablet (10 mg total) by mouth daily before breakfast., Disp: 30 tablet, Rfl: 2   ferrous sulfate 325 (65 FE) MG EC tablet, Take 325 mg by mouth daily as needed (cold hands)., Disp: , Rfl:    fluticasone (FLONASE) 50 MCG/ACT nasal spray, Place 2 sprays into both nostrils daily as needed for allergies or rhinitis., Disp: , Rfl:    furosemide (LASIX) 40 MG tablet, Take 1 tablet (40 mg total) by mouth daily as needed for edema or fluid (take if weight gain 2 to 3 lbs in 24 hrs or 5 lbs in 7 days.)., Disp: 30 tablet, Rfl: 5   latanoprost (XALATAN) 0.005 % ophthalmic solution, Place  1 drop into both eyes at bedtime. , Disp: , Rfl:    sacubitril-valsartan (ENTRESTO) 24-26 MG, Take 1 tablet by mouth 2 (two) times daily., Disp: 60 tablet, Rfl: 3   spironolactone (ALDACTONE) 25 MG tablet, Take 0.5 tablets (12.5 mg total) by mouth daily., Disp: 45 tablet, Rfl: 0   Objective:     BP 110/70 (BP Location: Right Arm, Patient Position: Sitting)   Pulse 65   Temp 98.5 F (36.9 C) (Temporal)   Ht 5\' 3"  (1.6 m)   Wt 127 lb 12.8 oz (58 kg)   SpO2 98%   BMI 22.64 kg/m    Physical Exam Constitutional:      General: She is not in acute distress.    Appearance: Normal appearance. She is not ill-appearing, toxic-appearing or  diaphoretic.  HENT:     Head: Normocephalic and atraumatic.     Right Ear: External ear normal.     Left Ear: External ear normal.     Mouth/Throat:     Mouth: Mucous membranes are moist.     Pharynx: Oropharynx is clear. No oropharyngeal exudate or posterior oropharyngeal erythema.  Eyes:     General: No scleral icterus.       Right eye: No discharge.        Left eye: No discharge.     Extraocular Movements: Extraocular movements intact.     Conjunctiva/sclera: Conjunctivae normal.     Pupils: Pupils are equal, round, and reactive to light.  Cardiovascular:     Rate and Rhythm: Normal rate and regular rhythm.  Pulmonary:     Effort: Pulmonary effort is normal. No respiratory distress.     Breath sounds: Normal breath sounds. No wheezing, rhonchi or rales.  Musculoskeletal:     Cervical back: No rigidity or tenderness.  Skin:    General: Skin is warm and dry.  Neurological:     Mental Status: She is alert and oriented to person, place, and time.  Psychiatric:        Mood and Affect: Mood normal.        Behavior: Behavior normal.      No results found for any visits on 01/24/23.    The 10-year ASCVD risk score (Arnett DK, et al., 2019) is: 26.2%    Assessment & Plan:   Low TSH level -     TSH+T4F+T3Free+ThyAbs+TPO+VD25  Elevated glucose -     Basic metabolic panel -     Hemoglobin A1c  Normocytic anemia -     CBC with Differential/Platelet -     B12 and Folate Panel -     Iron, TIBC and Ferritin Panel -     Urinalysis, Routine w reflex microscopic  History of tobacco use    Return in about 3 months (around 04/26/2023), or if symptoms worsen or fail to improve.  Continue exercising as tolerated.  Advised abstinence from alcohol.  Follow-up with cardiology.  Libby Maw, MD

## 2023-01-25 LAB — B12 AND FOLATE PANEL
Folate: 15 ng/mL (ref >5.9)
Vitamin B-12: 839 pg/mL (ref 211–911)

## 2023-01-25 LAB — CBC WITH DIFFERENTIAL/PLATELET
Basophils Absolute: 0 10*3/uL (ref 0.0–0.1)
Basophils Relative: 1 % (ref 0.0–3.0)
Eosinophils Absolute: 0.1 10*3/uL (ref 0.0–0.7)
Eosinophils Relative: 3 % (ref 0.0–5.0)
HCT: 37.4 % (ref 36.0–46.0)
Hemoglobin: 13 g/dL (ref 12.0–15.0)
Lymphocytes Relative: 31.2 % (ref 12.0–46.0)
Lymphs Abs: 0.9 10*3/uL (ref 0.7–4.0)
MCHC: 34.7 g/dL (ref 30.0–36.0)
MCV: 88.5 fl (ref 78.0–100.0)
Monocytes Absolute: 0.3 10*3/uL (ref 0.1–1.0)
Monocytes Relative: 9.9 % (ref 3.0–12.0)
Neutro Abs: 1.6 10*3/uL (ref 1.4–7.7)
Neutrophils Relative %: 54.9 % (ref 43.0–77.0)
Platelets: 142 10*3/uL — ABNORMAL LOW (ref 150.0–400.0)
RBC: 4.22 Mil/uL (ref 3.87–5.11)
RDW: 13.2 % (ref 11.5–15.5)
WBC: 3 10*3/uL — ABNORMAL LOW (ref 4.0–10.5)

## 2023-01-25 LAB — URINALYSIS, ROUTINE W REFLEX MICROSCOPIC
Bilirubin Urine: NEGATIVE
Hgb urine dipstick: NEGATIVE
Ketones, ur: NEGATIVE
Leukocytes,Ua: NEGATIVE
Nitrite: NEGATIVE
RBC / HPF: NONE SEEN (ref 0–?)
Specific Gravity, Urine: 1.015 (ref 1.000–1.030)
Total Protein, Urine: NEGATIVE
Urine Glucose: NEGATIVE
Urobilinogen, UA: 0.2 (ref 0.0–1.0)
pH: 6 (ref 5.0–8.0)

## 2023-01-25 LAB — BASIC METABOLIC PANEL
BUN: 15 mg/dL (ref 6–23)
CO2: 28 mEq/L (ref 19–32)
Calcium: 9.8 mg/dL (ref 8.4–10.5)
Chloride: 104 mEq/L (ref 96–112)
Creatinine, Ser: 0.81 mg/dL (ref 0.40–1.20)
GFR: 71.59 mL/min (ref >60.00)
Glucose, Bld: 87 mg/dL (ref 70–99)
Potassium: 4 mEq/L (ref 3.5–5.1)
Sodium: 140 mEq/L (ref 135–145)

## 2023-01-25 LAB — IRON,TIBC AND FERRITIN PANEL
%SAT: 22 % (calc) (ref 16–45)
Ferritin: 130 ng/mL (ref 16–288)
Iron: 70 ug/dL (ref 45–160)
TIBC: 321 mcg/dL (calc) (ref 250–450)

## 2023-01-25 LAB — HEMOGLOBIN A1C: Hgb A1c MFr Bld: 6 % (ref 4.6–6.5)

## 2023-01-27 ENCOUNTER — Other Ambulatory Visit (HOSPITAL_COMMUNITY): Payer: Self-pay

## 2023-01-27 ENCOUNTER — Ambulatory Visit (HOSPITAL_COMMUNITY)
Admission: RE | Admit: 2023-01-27 | Discharge: 2023-01-27 | Disposition: A | Discharge status: Home / Self Care | Payer: Medicare HMO | Source: Ambulatory Visit | Attending: Cardiology | Admitting: Cardiology

## 2023-01-27 ENCOUNTER — Telehealth (HOSPITAL_COMMUNITY): Payer: Self-pay | Admitting: Pharmacy Technician

## 2023-01-27 ENCOUNTER — Encounter (HOSPITAL_COMMUNITY): Payer: Self-pay | Admitting: Cardiology

## 2023-01-27 VITALS — BP 110/70 | HR 67 | Wt 128.2 lb

## 2023-01-27 DIAGNOSIS — Z7901 Long term (current) use of anticoagulants: Secondary | ICD-10-CM | POA: Diagnosis not present

## 2023-01-27 DIAGNOSIS — I7121 Aneurysm of the ascending aorta, without rupture: Secondary | ICD-10-CM | POA: Insufficient documentation

## 2023-01-27 DIAGNOSIS — Z87891 Personal history of nicotine dependence: Secondary | ICD-10-CM | POA: Insufficient documentation

## 2023-01-27 DIAGNOSIS — R0683 Snoring: Secondary | ICD-10-CM | POA: Diagnosis not present

## 2023-01-27 DIAGNOSIS — I701 Atherosclerosis of renal artery: Secondary | ICD-10-CM | POA: Diagnosis not present

## 2023-01-27 DIAGNOSIS — R001 Bradycardia, unspecified: Secondary | ICD-10-CM | POA: Insufficient documentation

## 2023-01-27 DIAGNOSIS — Z95820 Peripheral vascular angioplasty status with implants and grafts: Secondary | ICD-10-CM | POA: Insufficient documentation

## 2023-01-27 DIAGNOSIS — Z7902 Long term (current) use of antithrombotics/antiplatelets: Secondary | ICD-10-CM | POA: Insufficient documentation

## 2023-01-27 DIAGNOSIS — I150 Renovascular hypertension: Secondary | ICD-10-CM

## 2023-01-27 DIAGNOSIS — I480 Paroxysmal atrial fibrillation: Secondary | ICD-10-CM | POA: Insufficient documentation

## 2023-01-27 DIAGNOSIS — I160 Hypertensive urgency: Secondary | ICD-10-CM | POA: Insufficient documentation

## 2023-01-27 DIAGNOSIS — I110 Hypertensive heart disease with heart failure: Secondary | ICD-10-CM | POA: Diagnosis not present

## 2023-01-27 DIAGNOSIS — Z79899 Other long term (current) drug therapy: Secondary | ICD-10-CM | POA: Diagnosis not present

## 2023-01-27 DIAGNOSIS — I5032 Chronic diastolic (congestive) heart failure: Secondary | ICD-10-CM | POA: Diagnosis not present

## 2023-01-27 DIAGNOSIS — E785 Hyperlipidemia, unspecified: Secondary | ICD-10-CM | POA: Insufficient documentation

## 2023-01-27 DIAGNOSIS — Z7982 Long term (current) use of aspirin: Secondary | ICD-10-CM | POA: Insufficient documentation

## 2023-01-27 DIAGNOSIS — Z8673 Personal history of transient ischemic attack (TIA), and cerebral infarction without residual deficits: Secondary | ICD-10-CM | POA: Insufficient documentation

## 2023-01-27 MED ORDER — EMPAGLIFLOZIN 10 MG PO TABS: 10.0000 mg | ORAL_TABLET | Freq: Every day | ORAL | 8 refills | Status: DC

## 2023-01-27 MED ORDER — SPIRONOLACTONE 25 MG PO TABS: 25.0000 mg | ORAL_TABLET | Freq: Every day | ORAL | 5 refills | Status: DC

## 2023-01-27 NOTE — Patient Instructions (Addendum)
INCREASE Spironolactone to 25 mg( 1 tablet) daily  START Jardiance 10 mg daily  Lab work in 10 days  Your physician has requested that you have an echocardiogram. Echocardiography is a painless test that uses sound waves to create images of your heart. It provides your doctor with information about the size and shape of your heart and how well your heart's chambers and valves are working. This procedure takes approximately one hour. There are no restrictions for this procedure. Please do NOT wear cologne, perfume, aftershave, or lotions (deodorant is allowed). Please arrive 15 minutes prior to your appointment time.  Your physician recommends that you schedule a follow-up appointment in: 5 months with echo(August) Call office in June to schedule an appointment  If you have any questions or concerns before your next appointment please send Brooke Gray a message through [place] or call our office at [phone].    TO LEAVE A MESSAGE FOR THE NURSE SELECT OPTION 2, PLEASE LEAVE A MESSAGE INCLUDING: YOUR NAME DATE OF BIRTH CALL BACK NUMBER REASON FOR CALL**this is important as we prioritize the call backs  YOU WILL RECEIVE A CALL BACK THE SAME DAY AS LONG AS YOU CALL BEFORE 4:00 PM  At the [facility], you and your health needs are our priority. As part of our continuing mission to provide you with exceptional heart care, we have created designated Provider Care Teams. These Care Teams include your primary Cardiologist (physician) and Advanced Practice Providers (APPs- Physician Assistants and Nurse Practitioners) who all work together to provide you with the care you need, when you need it.   You may see any of the following providers on your designated Care Team at your next follow up: Dr Glori Bickers Dr Loralie Champagne Dr. Roxana Hires, NP Lyda Jester, [place] [facility] [place], [place] Forestine Na, NP Audry Riles, PharmD   Please be sure to bring  in all your medications bottles to every appointment.    Thank you for choosing [facility]

## 2023-01-27 NOTE — Telephone Encounter (Signed)
Advanced Heart Failure Patient Advocate Encounter  The patient was approved for a [place] that will help cover the cost of Entresto, . Total amount awarded, $10,000. Eligibility, 12/28/22 - 12/28/23.  ID [id number]  BIN [id number]  PCN [id number]  Group [id number]  Patient was provided copy in visit.  Charlann Boxer, CPhT

## 2023-01-27 NOTE — Progress Notes (Addendum)
ADVANCED HF CLINIC CLINIC NOTE  PCP: Libby Maw, MD HF Cardiologist: Dr. Daniel Nones  HPI: 77 y/o female w/ h/o poorly controlled HTN, chronic diastolic dysfunction w/ severe LVH by echocardiogram, TIA, PAF, bradycardia, HLD, remote tobacco use and ascending aortic aneurysm.    Admitted (1/21) w/ TIA, BP noted to be markedly elevated. Echo showed normal EF 60-65% + severe LVH w/ mild speckled appearance, GIDD and normal RV. Wore heart monitor after discharge which detected afib. Was referred to Afib clinic and recommended to start [place]. Started Eliquis. She had intolerance to high dose AVN blockers given bradycardia w/ occasional HRs in the 30s-40s. Unfortunately lost to f/u and not been seen by [facility] since 12/2019. Per Care Everywhere, was seen at [facility] 6/21 but no f/u since. Says she is now followed by [facility]. She has been on antihypertensives and diuretic therapy w/ torsemide.    Seen in the [facility] ED on 12/03/22 for acute dyspnea and respiratory distress, required BiPAP. Chest CT negative for PE. CXR showed pulmonary edema. BNP elevated 407. HS trop mildly elevated w/ flat trend, 237>>235. BP was elevated but not markedly high, 159/93. She was given dose of IV Lasix w/ good UOP and symptomatic response. Able to wean off BiPAP back to room air. Monitored in ED x 3 hrs post treatment w/ stable O2 sats and was discharged home.   Admitted 4 days later 12/07/22 with recurrent respiratory distress. Required BiPAP. BP elevated 197/113. Concern for recurrent flash pulmonary edema. Started on NTG gtt and given 80 mg IV Lasix. HsTroponin elevated. No chest pain or obvious ACS, elevation felt to be secondary to demain ischemia and hypertensive urgency. AHF consulted to assist with HF management. Echo showed  EF 60-65%, severe LVH, RV normal, trivial MR, mild TR. cMRI showed variant of hypertrophic cardiomyopathy LVEF 54% RV 55%. 2 week zio patch placed with  remote history of AFib. GDMT initially limited by AKI. GDMT titrated and she was discharged home, weight 125 lbs.  Interval hx:  Since her hospitalization Ms. Jemila is doing remarkably well.  Her blood pressure is now well-controlled and she has undergone renal artery stenting for an ostial stenosis.  Since that time she has no longer had issues with volume overload, shortness of breath, lower extremity edema or uncontrolled hypertension   Past Medical History:  Diagnosis Date   A-fib (HCC)    Cardiomegaly    Glaucoma    Hypertension    Current Outpatient Medications  Medication Sig Dispense Refill   acetaminophen (TYLENOL) 500 MG tablet Take 500 mg by mouth every 6 (six) hours as needed for mild pain, headache or fever.     Ascorbic Acid (VITAMIN C) 1000 MG tablet Take 1,000 mg by mouth daily.     aspirin EC 81 MG tablet Take 1 tablet (81 mg total) by mouth daily. Swallow whole. 90 tablet 3   atorvastatin (LIPITOR) 40 MG tablet Take 1 tablet (40 mg total) by mouth every evening. 30 tablet 5   carvedilol (COREG) 3.125 MG tablet Take 1 tablet (3.125 mg total) by mouth 2 (two) times daily with a meal. 30 tablet 3   Cholecalciferol (VITAMIN D3) 125 MCG (5000 UT) TABS Take 5,000 mcg by mouth daily.     clopidogrel (PLAVIX) 75 MG tablet Take 1 tablet (75 mg total) by mouth daily. 30 tablet 3   empagliflozin (JARDIANCE) 10 MG TABS tablet Take 1 tablet (10 mg total) by mouth daily before breakfast.  30 tablet 8   ferrous sulfate 325 (65 FE) MG EC tablet Take 325 mg by mouth daily as needed (cold hands).     fluticasone (FLONASE) 50 MCG/ACT nasal spray Place 2 sprays into both nostrils daily as needed for allergies or rhinitis.     latanoprost (XALATAN) 0.005 % ophthalmic solution Place 1 drop into both eyes at bedtime.      sacubitril-valsartan (ENTRESTO) 24-26 MG Take 1 tablet by mouth 2 (two) times daily. 60 tablet 3   empagliflozin (JARDIANCE) 10 MG TABS tablet Take 1 tablet (10 mg total) by  mouth daily before breakfast. (Patient not taking: Reported on 01/27/2023) 30 tablet 2   furosemide (LASIX) 40 MG tablet Take 1 tablet (40 mg total) by mouth daily as needed for edema or fluid (take if weight gain 2 to 3 lbs in 24 hrs or 5 lbs in 7 days.). (Patient not taking: Reported on 01/27/2023) 30 tablet 5   spironolactone (ALDACTONE) 25 MG tablet Take 1 tablet (25 mg total) by mouth daily. 90 tablet 5   No current facility-administered medications for this encounter.   Allergies  Allergen Reactions   Penicillins Hives   Social History   Socioeconomic History   Marital status: Married    Spouse name: Not on file   Number of children: Not on file   Years of education: Not on file   Highest education level: Not on file  Occupational History   Not on file  Tobacco Use   Smoking status: Former    Packs/day: 0.25    Years: 30.00    Additional pack years: 0.00    Total pack years: 7.50    Types: Cigarettes   Smokeless tobacco: Never  Vaping Use   Vaping Use: Never used  Substance and Sexual Activity   Alcohol use: Yes    Alcohol/week: 1.0 - 2.0 standard drink of alcohol    Types: 1 - 2 Glasses of wine per week    Comment: occasional   Drug use: No   Sexual activity: Not on file  Other Topics Concern   Not on file  Social History Narrative   Not on file   Social Determinants of Health   Financial Resource Strain: Not on file  Food Insecurity: No Food Insecurity (12/08/2022)   Hunger Vital Sign    Worried About Running Out of Food in the Last Year: Never true    Ran Out of Food in the Last Year: Never true  Transportation Needs: No Transportation Needs (12/08/2022)   PRAPARE - Hydrologist (Medical): No    Lack of Transportation (Non-Medical): No  Physical Activity: Not on file  Stress: Not on file  Social Connections: Not on file  Intimate Partner Violence: Not At Risk (12/08/2022)   Humiliation, Afraid, Rape, and Kick questionnaire     Fear of Current or Ex-Partner: No    Emotionally Abused: No    Physically Abused: No    Sexually Abused: No   Family History  Problem Relation Age of Onset   Allergic rhinitis Son    Angioedema Neg Hx    Asthma Neg Hx    Eczema Neg Hx    Immunodeficiency Neg Hx    Urticaria Neg Hx    BP 110/70   Pulse 67   Wt 58.2 kg (128 lb 3.2 oz)   SpO2 99%   BMI 22.71 kg/m   Wt Readings from Last 3 Encounters:  01/27/23 58.2 kg (128  lb 3.2 oz)  01/24/23 58 kg (127 lb 12.8 oz)  12/28/22 56.2 kg (124 lb)   PHYSICAL EXAM: Vitals:   01/27/23 1026  BP: 110/70  Pulse: 67  SpO2: 99%   GENERAL: Well nourished, well developed, and in no apparent distress at rest.  HEENT: Negative for arcus senilis or xanthelasma. There is no scleral icterus.  The mucous membranes are pink and moist.   NECK: Supple, No masses. Normal carotid upstrokes without bruits. No masses or thyromegaly.    CHEST: There are no chest wall deformities. There is no chest wall tenderness. Respirations are unlabored.  Lungs-CTA bilaterally CARDIAC:  JVP: 7 cm          Normal rate with regular rhythm. No murmurs, rubs or gallops.  Pulses are 2+ and symmetrical in upper and lower extremities.  No edema.  ABDOMEN: Soft, non-tender, non-distended. There are no masses or hepatomegaly. There are normal bowel sounds.  EXTREMITIES: Warm and well perfused with no cyanosis, clubbing.  LYMPHATIC: No axillary or supraclavicular lymphadenopathy.  NEUROLOGIC: Patient is oriented x3 with no focal or lateralizing neurologic deficits.  PSYCH: Patients affect is appropriate, there is no evidence of anxiety or depression.  SKIN: Warm and dry; no lesions or wounds.    ECG: SB 52 bpm (personally reviewed)  Cardiac Studies - cMRI (1/24): LVEF 54%, severe asymmetric septal hypertrophy but no LVOT or mitral valve SAM, RVEF 55%, extensive non-coronary pattern mid-wall LGE, findings suggestive of variant of HCM, cannot fully rule out cardiac  amyloidosis, but ECV < 40% less suggestive.  - Echo (1/24): EF 60-65%, severe LVH, RV normal, trivial MR, mild TR  - Heart Monitor (2/21)>>mostly SR at 55 bpm, episodes of afib 2/22 and 2/23 with v rates of 160-180 bpm. 2 runs of VT of 4-5 beats.    - Echo (1/21): EF 60-65%, severe LVH w/ mild speckled appearance, grade I DD, normal RV  ASSESSMENT & PLAN: 1. Chronic Diastolic Heart Failure - Echo (2021): EF 60-65%, severe LVH w/ mild speckled appearance - Echo (1/24): EF 60-65%. Severe LVH. RV normal, trivial MR, mild TR - suspect hypertensive CM, though cannot r/o infiltrative CM (prior echo w/ speckled myocardium).  - cMRI (1/24): LVEF 54%, severe asymmetric septal hypertrophy but no LVOT or mitral valve SAM, RVEF 55%, extensive non-coronary pattern mid-wall LGE, findings suggestive of variant of HCM, cannot fully rule out cardiac amyloidosis, but ECV < 40% less suggestive. - Send [facility] gene testing to assess for HCM and TTR, and arrange PYP scan. - NYHA I, she is not volume overloaded today. -Continue Coreg 3.125 - Continue Entresto 49/51 mg bid. -Increase spironolactone to 25 mg daily.  Follow-up labs in 1 week - Continue Jardiance 10 mg daily. - Continue Lasix 40 mg PRN. - BMET and BNP today.   2. Hypertension - long h/o poor control, despite reported med compliance - multiple presentations for flash pulmonary edema & hypertensive urgency. -Very well-controlled now after renal artery stenting.  Will continue to monitor.  3. RAS -Status post drug-eluting stent by Dr. Fletcher Anon of the right renal artery. -Continue Plavix and aspirin   4. PAF - h/o TIA in 2001 w/ afib detection on Holter monitor.  - Zio 2 week in place.  -EKG normal sinus rhythm.  Ziopatch results pending.   5. Snoring -Sleep study pending  6. Hyperlipidemia - Continue atorvastatin 40. LDL 143.  Reviewed and interpreted EKGs, echocardiogram personally. Reviewed angiography of renal artery with patient.     Emerson Barretto  Omesha Bowerman 1:07 PM

## 2023-01-28 LAB — TSH+T4F+T3FREE+THYABS+TPO+VD25
Free T4: 1.24 ng/dL (ref 0.82–1.77)
T3, Free: 3.2 pg/mL (ref 2.0–4.4)
TSH: 0.506 u[IU]/mL (ref 0.450–4.500)
Thyroglobulin Antibody: <1 IU/mL (ref 0.0–0.9)
Thyroperoxidase Ab SerPl-aCnc: <9 IU/mL (ref 0–34)
Vit D, 25-Hydroxy: 47.6 ng/mL (ref 30.0–100.0)

## 2023-01-30 ENCOUNTER — Encounter: Payer: Self-pay | Admitting: Family Medicine

## 2023-01-31 ENCOUNTER — Ambulatory Visit: Payer: Medicare HMO | Attending: Cardiovascular Disease | Admitting: Cardiovascular Disease

## 2023-01-31 ENCOUNTER — Encounter: Payer: Self-pay | Admitting: Cardiovascular Disease

## 2023-01-31 ENCOUNTER — Other Ambulatory Visit: Payer: Self-pay

## 2023-01-31 VITALS — BP 132/82 | HR 59 | Ht 63.0 in | Wt 128.0 lb

## 2023-01-31 DIAGNOSIS — I5032 Chronic diastolic (congestive) heart failure: Secondary | ICD-10-CM

## 2023-01-31 DIAGNOSIS — I701 Atherosclerosis of renal artery: Secondary | ICD-10-CM | POA: Diagnosis not present

## 2023-01-31 DIAGNOSIS — Z1231 Encounter for screening mammogram for malignant neoplasm of breast: Secondary | ICD-10-CM

## 2023-01-31 DIAGNOSIS — E785 Hyperlipidemia, unspecified: Secondary | ICD-10-CM

## 2023-01-31 NOTE — Patient Instructions (Signed)
Medication Instructions:  STOP the Plavix *If you need a refill on your cardiac medications before your next appointment, please call your pharmacy*   Lab Work: None ordered If you have labs (blood work) drawn today and your tests are completely normal, you will receive your results only by: [place] (if you have MyChart) OR A paper copy in the mail If you have any lab test that is abnormal or we need to change your treatment, we will call you to review the results.   Testing/Procedures: None ordered   Follow-Up: At [facility], you and your health needs are our priority.  As part of our continuing mission to provide you with exceptional heart care, we have created designated Provider Care Teams.  These Care Teams include your primary Cardiologist (physician) and Advanced Practice Providers (APPs -  Physician Assistants and Nurse Practitioners) who all work together to provide you with the care you need, when you need it.  We recommend signing up for the patient portal called "MyChart".  Sign up information is provided on this After Visit Summary.  MyChart is used to connect with patients for Virtual Visits (Telemedicine).  Patients are able to view lab/test results, encounter notes, upcoming appointments, etc.  Non-urgent messages can be sent to your provider as well.   To learn more about what you can do with MyChart, go to [email or web address].    Your next appointment:   6 month(s)  Provider:   Dr. Fletcher Anon

## 2023-01-31 NOTE — Progress Notes (Signed)
Cardiology Office Note   Date:  01/31/2023   ID:  Brooke Gray, DOB 18-Dec-1945, MRN [id number]  PCP:  Libby Maw, MD  Cardiologist:   Dr. Daniel Nones  No chief complaint on file.     History of Present Illness: Brooke Gray is a 77 y.o. female who is here today for follow-up visit after recent right renal artery stenting.    She has known history of poorly controlled hypertension, chronic diastolic heart failure, severe left ventricular hypertrophy on echo, TIA, questionable paroxysmal atrial fibrillation, hyperlipidemia, previous tobacco use and ascending aortic aneurysm.  She was seen last month for severe right renal artery stenosis with refractory hypertension and recurrent pulmonary edema.  Angiography was performed on February 14 which confirmed severe ostial right renal artery stenosis.  I performed successful angioplasty and stent placement.  She has been doing well since then but reports persistent hiccups which started 2 weeks after the procedure.  A follow-up renal artery duplex showed patent right renal artery.  She is doing well overall but Is concerned about taking too many medications.  Past Medical History:  Diagnosis Date   A-fib [facility])    Cardiomegaly    Glaucoma    Hypertension     Past Surgical History:  Procedure Laterality Date   no past surgery     RENAL ANGIOGRAPHY N/A 12/28/2022   Procedure: RENAL ANGIOGRAPHY;  Surgeon: Wellington Hampshire, MD;  Location: [place] [facility];  Service: Cardiovascular;  Laterality: N/A;   RENAL INTERVENTION  12/28/2022   Procedure: RENAL INTERVENTION;  Surgeon: Wellington Hampshire, MD;  Location: [place] [facility];  Service: Cardiovascular;;  right renal     Current Outpatient Medications  Medication Sig Dispense Refill   acetaminophen (TYLENOL) 500 MG tablet Take 500 mg by mouth every 6 (six) hours as needed for mild pain, headache or fever.     Ascorbic Acid (VITAMIN C) 1000 MG tablet Take 1,000 mg  by mouth daily.     aspirin EC 81 MG tablet Take 1 tablet (81 mg total) by mouth daily. Swallow whole. 90 tablet 3   atorvastatin (LIPITOR) 40 MG tablet Take 1 tablet (40 mg total) by mouth every evening. 30 tablet 5   carvedilol (COREG) 3.125 MG tablet Take 1 tablet (3.125 mg total) by mouth 2 (two) times daily with a meal. 30 tablet 3   Cholecalciferol (VITAMIN D3) 125 MCG (5000 UT) TABS Take 5,000 mcg by mouth daily.     empagliflozin (JARDIANCE) 10 MG TABS tablet Take 1 tablet (10 mg total) by mouth daily before breakfast. 30 tablet 8   ferrous sulfate 325 (65 FE) MG EC tablet Take 325 mg by mouth daily as needed (cold hands).     fluticasone (FLONASE) 50 MCG/ACT nasal spray Place 2 sprays into both nostrils daily as needed for allergies or rhinitis.     latanoprost (XALATAN) 0.005 % ophthalmic solution Place 1 drop into both eyes at bedtime.      sacubitril-valsartan (ENTRESTO) 24-26 MG Take 1 tablet by mouth 2 (two) times daily. 60 tablet 3   spironolactone (ALDACTONE) 25 MG tablet Take 1 tablet (25 mg total) by mouth daily. 90 tablet 5   furosemide (LASIX) 40 MG tablet Take 1 tablet (40 mg total) by mouth daily as needed for edema or fluid (take if weight gain 2 to 3 lbs in 24 hrs or 5 lbs in 7 days.). (Patient not taking: Reported on 01/27/2023) 30 tablet 5   No  current facility-administered medications for this visit.    Allergies:   Penicillins    Social History:  The patient  reports that she has quit smoking. Her smoking use included cigarettes. She has a 7.50 pack-year smoking history. She has never used smokeless tobacco. She reports current alcohol use of about 1.0 - 2.0 standard drink of alcohol per week. She reports that she does not use drugs.   Family History:  The patient's family history includes Allergic rhinitis in her son.    ROS:  Please see the history of present illness.   Otherwise, review of systems are positive for none.   All other systems are reviewed and  negative.    PHYSICAL EXAM: VS:  BP 132/82   Pulse (!) 59   Ht 5\' 3"  (1.6 m)   Wt 128 lb (58.1 kg)   SpO2 97%   BMI 22.67 kg/m  , BMI Body mass index is 22.67 kg/m. GEN: Well nourished, well developed, in no acute distress  HEENT: normal  Neck: no JVD, carotid bruits, or masses Cardiac: RRR; no murmurs, rubs, or gallops,no edema  Respiratory:  clear to auscultation bilaterally, normal work of breathing GI: soft, nontender, nondistended, + BS MS: no deformity or atrophy  Skin: warm and dry, no rash Neuro:  Strength and sensation are intact Psych: euthymic mood, full affect No groin hematoma.  EKG:  EKG is not ordered today.    Recent Labs: 12/07/2022: ALT 50 12/19/2022: B Natriuretic Peptide 263.9 01/24/2023: BUN 15; Creatinine, Ser 0.81; Hemoglobin 13.0; Platelets 142.0; Potassium 4.0; Sodium 140; TSH 0.506    Lipid Panel    Component Value Date/Time   CHOL 225 (H) 12/07/2022 1634   TRIG 46 12/07/2022 1634   HDL 73 12/07/2022 1634   CHOLHDL 3.1 12/07/2022 1634   VLDL 9 12/07/2022 1634   LDLCALC 143 (H) 12/07/2022 1634      Wt Readings from Last 3 Encounters:  01/31/23 128 lb (58.1 kg)  01/27/23 128 lb 3.2 oz (58.2 kg)  01/24/23 127 lb 12.8 oz (58 kg)           No data to display            ASSESSMENT AND PLAN:  1.  Renal artery stenosis: Most recent right renal artery stenting with excellent results.  Follow-up renal artery duplex showed patent stent with no significant restenosis.  Repeat study in 6 months.  Continue aspirin 81 mg once daily.   Her hiccups could be related to clopidogrel and given that it has been more than 1 month since the procedure, I elected to discontinue clopidogrel today.  2.  Chronic diastolic heart failure: She appears to be euvolemic without any diuretics.  She is on optimal medications.  3.  Hyperlipidemia: Continue atorvastatin 40 mg daily with a target LDL of less than 70.  4.  Essential hypertension: Blood pressure is  controlled on current medications.    Disposition:   Continue regular follow-up in the heart failure clinic.  Follow-up with me in 1 year.   Signed,  Kathlyn Sacramento, MD  01/31/2023 10:29 AM    [facility]

## 2023-02-01 ENCOUNTER — Telehealth: Payer: Self-pay | Admitting: Family Medicine

## 2023-02-01 NOTE — Telephone Encounter (Signed)
Pt returned your call about her labs

## 2023-02-02 ENCOUNTER — Other Ambulatory Visit: Payer: Self-pay

## 2023-02-02 ENCOUNTER — Telehealth: Payer: Self-pay

## 2023-02-02 DIAGNOSIS — Z1231 Encounter for screening mammogram for malignant neoplasm of breast: Secondary | ICD-10-CM

## 2023-02-02 NOTE — Telephone Encounter (Signed)
Returned patients call went over labs and recommendations.  

## 2023-02-02 NOTE — Telephone Encounter (Signed)
Called patient went over lab results and recommendations patient agrees and will continue on current medication. Per patient she has concerns about continued hiccups that she think is coming from all of her medications. She would like to know what she can do to stop the every day hiccups. Asking if she needs to schedule an appointment? Please advise.

## 2023-02-03 NOTE — Telephone Encounter (Signed)
Called patient to inform that appointment was needed. No answer LM for patient to call back to schedule appointment

## 2023-02-08 ENCOUNTER — Other Ambulatory Visit (HOSPITAL_COMMUNITY): Payer: Medicare HMO

## 2023-02-08 ENCOUNTER — Telehealth (HOSPITAL_COMMUNITY): Payer: Self-pay | Admitting: Cardiology

## 2023-02-08 NOTE — Telephone Encounter (Signed)
Patient left VM on triage line with questions about upcoming lab appt   Returned call, no answer [facility]

## 2023-02-13 ENCOUNTER — Other Ambulatory Visit (HOSPITAL_COMMUNITY): Payer: Medicare HMO

## 2023-02-14 ENCOUNTER — Ambulatory Visit (HOSPITAL_COMMUNITY)
Admission: RE | Admit: 2023-02-14 | Discharge: 2023-02-14 | Disposition: A | Discharge status: Home / Self Care | Payer: Medicare HMO | Source: Ambulatory Visit | Attending: Internal Medicine | Admitting: Internal Medicine

## 2023-02-14 DIAGNOSIS — I5032 Chronic diastolic (congestive) heart failure: Secondary | ICD-10-CM | POA: Insufficient documentation

## 2023-02-14 LAB — BASIC METABOLIC PANEL
Anion gap: 12 (ref 5–15)
BUN: 15 mg/dL (ref 8–23)
CO2: 26 mmol/L (ref 22–32)
Calcium: 11.4 mg/dL — ABNORMAL HIGH (ref 8.9–10.3)
Chloride: 100 mmol/L (ref 98–111)
Creatinine, Ser: 0.86 mg/dL (ref 0.44–1.00)
GFR, Estimated: >60 mL/min (ref >60)
Glucose, Bld: 106 mg/dL — ABNORMAL HIGH (ref 70–99)
Potassium: 4.7 mmol/L (ref 3.5–5.1)
Sodium: 138 mmol/L (ref 135–145)

## 2023-02-28 ENCOUNTER — Ambulatory Visit: Payer: Medicare HMO | Admitting: Family Medicine

## 2023-03-07 ENCOUNTER — Encounter (HOSPITAL_COMMUNITY): Admission: RE | Admit: 2023-03-07 | Payer: Medicare HMO | Source: Ambulatory Visit

## 2023-03-07 ENCOUNTER — Ambulatory Visit (HOSPITAL_COMMUNITY): Payer: Medicare HMO

## 2023-03-09 ENCOUNTER — Ambulatory Visit (INDEPENDENT_AMBULATORY_CARE_PROVIDER_SITE_OTHER): Payer: Medicare HMO | Admitting: Physician Assistant

## 2023-03-09 ENCOUNTER — Encounter: Payer: Self-pay | Admitting: Physician Assistant

## 2023-03-09 ENCOUNTER — Other Ambulatory Visit (INDEPENDENT_AMBULATORY_CARE_PROVIDER_SITE_OTHER): Payer: Medicare HMO

## 2023-03-09 DIAGNOSIS — G8929 Other chronic pain: Secondary | ICD-10-CM

## 2023-03-09 DIAGNOSIS — M25511 Pain in right shoulder: Secondary | ICD-10-CM

## 2023-03-09 NOTE — Progress Notes (Signed)
Office Visit Note   Patient: Brooke Gray           Date of Birth: Feb 09, 1946           MRN: [id number] Visit Date: 03/09/2023              Requested by: Mliss Sax, MD [address] [place],  [place] [address] PCP: Mliss Sax, MD   Assessment & Plan: Visit Diagnoses:  1. Chronic right shoulder pain     Plan: Pleasant active 77 year old woman who enjoys African dancing.  She has had a 85-month history of right shoulder pain that actually is along the medial border of her scapula.  She says when she reaches a certain way it sore.  She has tried some Rohlfing which does seem to help her.  She does not have any radicular findings.  Her x-rays overall look good.  She has had x-rays before that do demonstrate she does have a thoracic low scoliosis which may be contributing to this problem.  Her strength is good.  I talked about using a topical Voltaren gel in that 1 area.  We also discussed physical therapy but she would prefer to keep trying to do the R[place]ing as it has given her relief.  She is going to also consider acupuncture.  If she has increasing symptoms weakness or radicular findings she will contact me.  If she follows up I would recommend an x-ray of her cervical and or thoracic spine  Follow-Up Instructions: Return if symptoms worsen or fail to improve.   Orders:  Orders Placed This Encounter  Procedures   XR Shoulder Right   No orders of the defined types were placed in this encounter.     Procedures: No procedures performed   Clinical Data: No additional findings.   Subjective: Chief Complaint  Patient presents with   Right Shoulder - Pain    HPI Patient is a pleasant 77 year old woman with a 72-month history of right shoulder pain she has no known injury.  She feels she cannot carry any weight on that shoulder.  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  She is left-hand dominant.  She has been using Tylenol Review of  Systems  All other systems reviewed and are negative.    Objective: Vital Signs: There were no vitals taken for this visit.  Physical Exam Constitutional:      Appearance: Normal appearance.  Pulmonary:     Effort: Pulmonary effort is normal.  Skin:    General: Skin is warm and dry.  Neurological:     General: No focal deficit present.     Mental Status: She is alert.     Ortho Exam Examination of her right shoulder she has full forward elevation internal rotation behind the back her grip strength is intact she is neurovascular intact she has excellent strength with resisted abduction external and internal rotation.  Cannot appreciate any scapular weight winging.  She does have muscle soreness along the medial border of the scapula. Specialty Comments:  No specialty comments available.  Imaging: XR Shoulder Right  Result Date: 03/09/2023 Radiographs of her right shoulder were reviewed today.  No acute osseous findings.  She has very little signs of degenerative changes maybe a small amount in the [facility] joint.    PMFS History: Patient Active Problem List   Diagnosis Date Noted   Elevated glucose 01/24/2023   Low TSH level 01/24/2023   Normocytic anemia 01/24/2023  Sepsis due to pneumonia 12/08/2022   Septic shock 12/08/2022   Heart failure 12/08/2022   Acute respiratory failure with hypoxia 12/07/2022   Paroxysmal atrial fibrillation 12/07/2022   Thrombocytopenia 12/07/2022   Transaminitis 12/07/2022   Hypertensive urgency 12/01/2019   Elevated troponin 12/01/2019   Hypertensive crisis 11/30/2019   Ascending aortic aneurysm 11/24/2019   LVH (left ventricular hypertrophy) due to hypertensive disease, without heart failure 11/24/2019   Acute on chronic diastolic CHF (congestive heart failure) 11/24/2019   Hyperlipemia 11/23/2019   TIA (transient ischemic attack) 11/22/2019   Globus sensation 04/10/2019   Nonallergic rhinitis 04/10/2019   History of tobacco use  04/10/2019   Past Medical History:  Diagnosis Date   A-fib    Cardiomegaly    Glaucoma    Hypertension     Family History  Problem Relation Age of Onset   Allergic rhinitis Son    Angioedema Neg Hx    Asthma Neg Hx    Eczema Neg Hx    Immunodeficiency Neg Hx    Urticaria Neg Hx     Past Surgical History:  Procedure Laterality Date   no past surgery     RENAL ANGIOGRAPHY N/A 12/28/2022   Procedure: RENAL ANGIOGRAPHY;  Surgeon: Iran Ouch, MD;  Location: [facility];  Service: Cardiovascular;  Laterality: N/A;   RENAL INTERVENTION  12/28/2022   Procedure: RENAL INTERVENTION;  Surgeon: Iran Ouch, MD;  Location: [facility];  Service: Cardiovascular;;  right renal   Social History   Occupational History   Not on file  Tobacco Use   Smoking status: Former    Packs/day: 0.25    Years: 30.00    Additional pack years: 0.00    Total pack years: 7.50    Types: Cigarettes   Smokeless tobacco: Never  Vaping Use   Vaping Use: Never used  Substance and Sexual Activity   Alcohol use: Yes    Alcohol/week: 1.0 - 2.0 standard drink of alcohol    Types: 1 - 2 Glasses of wine per week    Comment: occasional   Drug use: No   Sexual activity: Not on file

## 2023-03-13 ENCOUNTER — Ambulatory Visit
Admission: RE | Admit: 2023-03-13 | Discharge: 2023-03-13 | Disposition: A | Discharge status: Home / Self Care | Payer: Medicare HMO | Source: Ambulatory Visit | Attending: Family Medicine | Admitting: Family Medicine

## 2023-03-13 ENCOUNTER — Encounter: Payer: Self-pay | Admitting: Family Medicine

## 2023-03-13 ENCOUNTER — Ambulatory Visit (INDEPENDENT_AMBULATORY_CARE_PROVIDER_SITE_OTHER): Payer: Medicare HMO | Admitting: Family Medicine

## 2023-03-13 VITALS — BP 98/60 | HR 71 | Temp 97.5°F | Ht 63.0 in | Wt 124.2 lb

## 2023-03-13 DIAGNOSIS — I952 Hypotension due to drugs: Secondary | ICD-10-CM

## 2023-03-13 DIAGNOSIS — R066 Hiccough: Secondary | ICD-10-CM | POA: Diagnosis not present

## 2023-03-13 DIAGNOSIS — Z1231 Encounter for screening mammogram for malignant neoplasm of breast: Secondary | ICD-10-CM

## 2023-03-13 NOTE — Progress Notes (Signed)
Established Patient Office Visit   Subjective:  Patient ID: Brooke Gray, female    DOB: 09/17/1946  Age: 77 y.o. MRN: [id number]  Chief Complaint  Patient presents with   Medication Problem    Concerns that she might be taking to many medications BP readings very low causing weakness. Also frequent hiccups.     HPI Encounter Diagnoses  Name Primary?   Hiccups Yes   Hypotension due to drugs    Blood pressure has been running in the less than 110/70 range.  She continues with 25 mg of Aldactone carvedilol 3.125 twice daily and increased dose 24/26.  She is not taking furosemide.  She experiences hiccups periodically in the morning and in the evening.    Review of Systems  Constitutional: Negative.   HENT: Negative.    Eyes:  Negative for blurred vision, discharge and redness.  Respiratory: Negative.    Cardiovascular: Negative.   Gastrointestinal:  Negative for abdominal pain.  Genitourinary: Negative.   Musculoskeletal: Negative.  Negative for myalgias.  Skin:  Negative for rash.  Neurological:  Negative for tingling, loss of consciousness and weakness.  Endo/Heme/Allergies:  Negative for polydipsia.     Current Outpatient Medications:    acetaminophen (TYLENOL) 500 MG tablet, Take 500 mg by mouth every 6 (six) hours as needed for mild pain, headache or fever., Disp: , Rfl:    Ascorbic Acid (VITAMIN C) 1000 MG tablet, Take 1,000 mg by mouth daily., Disp: , Rfl:    aspirin EC 81 MG tablet, Take 1 tablet (81 mg total) by mouth daily. Swallow whole., Disp: 90 tablet, Rfl: 3   atorvastatin (LIPITOR) 40 MG tablet, Take 1 tablet (40 mg total) by mouth every evening., Disp: 30 tablet, Rfl: 5   carvedilol (COREG) 3.125 MG tablet, Take 1 tablet (3.125 mg total) by mouth 2 (two) times daily with a meal., Disp: 30 tablet, Rfl: 3   Cholecalciferol (VITAMIN D3) 125 MCG (5000 UT) TABS, Take 5,000 mcg by mouth daily., Disp: , Rfl:    empagliflozin (JARDIANCE) 10 MG TABS tablet, Take 1  tablet (10 mg total) by mouth daily before breakfast., Disp: 30 tablet, Rfl: 8   ferrous sulfate 325 (65 FE) MG EC tablet, Take 325 mg by mouth daily as needed (cold hands)., Disp: , Rfl:    fluticasone (FLONASE) 50 MCG/ACT nasal spray, Place 2 sprays into both nostrils daily as needed for allergies or rhinitis., Disp: , Rfl:    latanoprost (XALATAN) 0.005 % ophthalmic solution, Place 1 drop into both eyes at bedtime. , Disp: , Rfl:    sacubitril-valsartan (ENTRESTO) 24-26 MG, Take 1 tablet by mouth 2 (two) times daily., Disp: 60 tablet, Rfl: 3   spironolactone (ALDACTONE) 25 MG tablet, Take 1 tablet (25 mg total) by mouth daily., Disp: 90 tablet, Rfl: 5   furosemide (LASIX) 40 MG tablet, Take 1 tablet (40 mg total) by mouth daily as needed for edema or fluid (take if weight gain 2 to 3 lbs in 24 hrs or 5 lbs in 7 days.). (Patient not taking: Reported on 03/13/2023), Disp: 30 tablet, Rfl: 5   Objective:     BP 98/60 (BP Location: Right Arm, Patient Position: Sitting, Cuff Size: Normal)   Pulse 71   Temp (!) 97.5 F (36.4 C) (Temporal)   Ht 5\' 3"  (1.6 m)   Wt 124 lb 3.2 oz (56.3 kg)   SpO2 96%   BMI 22.00 kg/m    Physical Exam Constitutional:  General: She is not in acute distress.    Appearance: Normal appearance. She is not ill-appearing, toxic-appearing or diaphoretic.  HENT:     Head: Normocephalic and atraumatic.     Right Ear: External ear normal.     Left Ear: External ear normal.  Eyes:     General: No scleral icterus.       Right eye: No discharge.        Left eye: No discharge.     Extraocular Movements: Extraocular movements intact.     Conjunctiva/sclera: Conjunctivae normal.  Cardiovascular:     Rate and Rhythm: Normal rate and regular rhythm.  Pulmonary:     Effort: Pulmonary effort is normal. No respiratory distress.     Breath sounds: No wheezing, rhonchi or rales.  Musculoskeletal:     Right lower leg: No edema.     Left lower leg: No edema.  Skin:     General: Skin is warm and dry.  Neurological:     Mental Status: She is alert and oriented to person, place, and time.  Psychiatric:        Mood and Affect: Mood normal.        Behavior: Behavior normal.      No results found for any visits on 03/13/23.    The 10-year ASCVD risk score (Arnett DK, et al., 2019) is: 22.3%    Assessment & Plan:   Hiccups  Hypotension due to drugs -     Ambulatory referral to Cardiology    Return in about 6 months (around 09/12/2023).  Decrease Aldactone to 1/2 tablet daily.  Continue to hold Lasix.  Discussed breath-holding for intermittent hiccups or biting into a lemon.   Mliss Sax, MD

## 2023-03-14 ENCOUNTER — Telehealth: Payer: Self-pay | Admitting: Cardiovascular Disease

## 2023-03-14 NOTE — Telephone Encounter (Signed)
Pt c/o BP issue: STAT if pt c/o blurred vision, one-sided weakness or slurred speech  1. What are your last 5 BP readings? 110/70 today, states that it has been running around 100/70's  2. Are you having any other symptoms (ex. Dizziness, headache, blurred vision, passed out)? Always tired, no energy, just not feeling well  3. What is your BP issue? Patient is having low BP, states that this has been going on for 2 weeks now. She states that she went to [facility] yesterday as a walk in just so she could speak with someone and states that she was told to take 1/2 of a Spironolactone at night by a nurse. She says that she is having a hard time cutting it because it is so small and was told that just to take what she could. She did not like that answer and is wanting to speak with someone else regarding this. She says that she feels no one is managing her care because her PCP told her the same thing regarding the spironolactone. She says that she is on so many medications and is having chronic hiccups and feels like the medications are causing all of these side effects.

## 2023-03-14 NOTE — Telephone Encounter (Signed)
Patient stated that her blood pressure has been low in the low in the 100's/over 6-'s-80's. She says has been tired and have no energy. I did advise low blood pressure can cause lack of energy.   [facility] she states she was informed to cut the spironolactone in half by the nurse and take at bedtime only.  She has a pill cutter but feels the pill doesn't split evenly because its so small. She does not like doing that because she doesn't feel she is getting the proper dose because it does not split properly. She stated she was told to take whatever comes out . She feels that is no way to manage medication. She states she will start taking the spironolactone at bedtime today and monitor her blood pressure like she was informed by the nurse at [facility] to see if that helps. She also has chronic hiccups since she started taking spironolactone. She feels she is taking too much medication and would like some things changed because she doesn't like how its making her feel. She denies any chest pain or discomfort, shortness of breath, headache, or dizziness currently. She did state she does get short of breath sometimes but feels its because she has no energy to do anything. She did see her PCP yesterday before [facility] and was told by pcp to cut the spironolactone in half as well. I did advise that Dr. Gasper Lloyd did prescribe the spironolactone and she should follow their instructions. I would forward to provider because she would like your advise.  Blood pressure is currently 116/82. She does feel machine needs to be calibrated.

## 2023-03-15 ENCOUNTER — Encounter (HOSPITAL_COMMUNITY): Payer: Self-pay

## 2023-03-15 ENCOUNTER — Encounter: Payer: Self-pay | Admitting: Cardiovascular Disease

## 2023-03-15 ENCOUNTER — Telehealth (HOSPITAL_COMMUNITY): Payer: Self-pay

## 2023-03-15 ENCOUNTER — Other Ambulatory Visit (HOSPITAL_COMMUNITY): Payer: Self-pay

## 2023-03-15 MED ORDER — METOPROLOL SUCCINATE ER 25 MG PO TB24: 25.0000 mg | ORAL_TABLET | Freq: Every day | ORAL | 6 refills | Status: DC

## 2023-03-15 NOTE — Telephone Encounter (Signed)
Message to patient about hiccups sent.

## 2023-03-15 NOTE — Telephone Encounter (Signed)
Spoke with Tresa Endo from [facility]. She is aware the medication the patient ha concerns about was prescribed by their office. She will give patient a call.

## 2023-03-15 NOTE — Telephone Encounter (Signed)
Good morning - she has called our office and I wanted to make sure you were addressing before we got involved.  Let us know.  Thanks!

## 2023-03-15 NOTE — Telephone Encounter (Signed)
Patient has called other cardiology office and called [place] today. Please see notes from office as well. I spoke with LPN there as well and she told them she came into our office yesterday and was told to cut her spironolactone in 1/2 and take at night. I do see notes indicating that change. She does like taking the spiro. Her biggest complaint is feeling fatigued.  She is complaining of feeling tired and seems to not want to be on any medications. How should she be taking spironolactone? Do we need to adjust anything for her BP?

## 2023-03-15 NOTE — Telephone Encounter (Signed)
I spoke to patient and went over medication changes and updates. She verbalized understanding with teach back method.

## 2023-03-17 ENCOUNTER — Encounter: Payer: Self-pay | Admitting: Cardiovascular Disease

## 2023-03-20 ENCOUNTER — Other Ambulatory Visit: Payer: Self-pay | Admitting: Family Medicine

## 2023-03-20 ENCOUNTER — Encounter (HOSPITAL_BASED_OUTPATIENT_CLINIC_OR_DEPARTMENT_OTHER): Payer: Self-pay | Admitting: Urology

## 2023-03-20 ENCOUNTER — Emergency Department (HOSPITAL_BASED_OUTPATIENT_CLINIC_OR_DEPARTMENT_OTHER)
Admission: EM | Admit: 2023-03-20 | Discharge: 2023-03-20 | Disposition: A | Discharge status: Home / Self Care | Payer: Medicare HMO | Attending: Emergency Medicine | Admitting: Emergency Medicine

## 2023-03-20 DIAGNOSIS — Z79899 Other long term (current) drug therapy: Secondary | ICD-10-CM | POA: Diagnosis not present

## 2023-03-20 DIAGNOSIS — Z7982 Long term (current) use of aspirin: Secondary | ICD-10-CM | POA: Insufficient documentation

## 2023-03-20 DIAGNOSIS — R5383 Other fatigue: Secondary | ICD-10-CM | POA: Diagnosis not present

## 2023-03-20 DIAGNOSIS — D72819 Decreased white blood cell count, unspecified: Secondary | ICD-10-CM | POA: Insufficient documentation

## 2023-03-20 DIAGNOSIS — I10 Essential (primary) hypertension: Secondary | ICD-10-CM | POA: Insufficient documentation

## 2023-03-20 DIAGNOSIS — R928 Other abnormal and inconclusive findings on diagnostic imaging of breast: Secondary | ICD-10-CM

## 2023-03-20 LAB — CBC WITH DIFFERENTIAL/PLATELET
Abs Immature Granulocytes: 0 10*3/uL (ref 0.00–0.07)
Basophils Absolute: 0 10*3/uL (ref 0.0–0.1)
Basophils Relative: 1 %
Eosinophils Absolute: 0.2 10*3/uL (ref 0.0–0.5)
Eosinophils Relative: 5 %
HCT: 36.4 % (ref 36.0–46.0)
Hemoglobin: 12.6 g/dL (ref 12.0–15.0)
Immature Granulocytes: 0 %
Lymphocytes Relative: 33 %
Lymphs Abs: 1.1 10*3/uL (ref 0.7–4.0)
MCH: 29.9 pg (ref 26.0–34.0)
MCHC: 34.6 g/dL (ref 30.0–36.0)
MCV: 86.5 fL (ref 80.0–100.0)
Monocytes Absolute: 0.5 10*3/uL (ref 0.1–1.0)
Monocytes Relative: 15 %
Neutro Abs: 1.5 10*3/uL — ABNORMAL LOW (ref 1.7–7.7)
Neutrophils Relative %: 46 %
Platelets: 169 10*3/uL (ref 150–400)
RBC: 4.21 MIL/uL (ref 3.87–5.11)
RDW: 12.9 % (ref 11.5–15.5)
WBC: 3.3 10*3/uL — ABNORMAL LOW (ref 4.0–10.5)
nRBC: 0 % (ref 0.0–0.2)

## 2023-03-20 LAB — COMPREHENSIVE METABOLIC PANEL
ALT: 16 U/L (ref 0–44)
AST: 30 U/L (ref 15–41)
Albumin: 3.7 g/dL (ref 3.5–5.0)
Alkaline Phosphatase: 44 U/L (ref 38–126)
Anion gap: 11 (ref 5–15)
BUN: 24 mg/dL — ABNORMAL HIGH (ref 8–23)
CO2: 24 mmol/L (ref 22–32)
Calcium: 9.3 mg/dL (ref 8.9–10.3)
Chloride: 101 mmol/L (ref 98–111)
Creatinine, Ser: 0.85 mg/dL (ref 0.44–1.00)
GFR, Estimated: >60 mL/min (ref >60)
Glucose, Bld: 109 mg/dL — ABNORMAL HIGH (ref 70–99)
Potassium: 4 mmol/L (ref 3.5–5.1)
Sodium: 136 mmol/L (ref 135–145)
Total Bilirubin: 0.5 mg/dL (ref 0.3–1.2)
Total Protein: 7 g/dL (ref 6.5–8.1)

## 2023-03-20 NOTE — ED Notes (Signed)
Pt A&OX4 ambulatory at d/c with independent steady gait. Pt verbalized understanding of d/c instructions and follow up care. 

## 2023-03-20 NOTE — ED Notes (Signed)
Pt laid flat at 7:32 for orthostatics

## 2023-03-20 NOTE — ED Notes (Signed)
Pt orthostatics taken in rt arm, pt also wanted BP checked in lt arm 136/86 pulse 66

## 2023-03-20 NOTE — ED Provider Notes (Signed)
[place] [facility] Provider Note   CSN: [id number] Arrival date & time: 03/20/23  1743     History Chief Complaint  Patient presents with   Hypotension    Brooke Gray is a 77 y.o. female with h/o HTN, heart failure presents to the ER for evaluation of fatigue that she has had over the past 3-4 weeks. She reports that she feels like she is being over medicated. She reports that she has been taking her BP multiple times a day and the lowest was 85/79 earlier. She was concerned given that her BP was different on both her arms as well.  She thinks her low blood pressure is making her fatigued. She was seen at her cardiologist's office last week as was told to stop carvediolol, take half of her aldactone, and to start metoprolol. She has been intact with multiple of her providers about this fatigue, but she feels like nothing is working. She stopped the carvedilol two days ago. She reports that she read the side effects of her metoprolol and sees that it mentions fatigue as well. She does not want a medication with this side effect. She denies any chest pain or SOB. 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 Denies any depression.   HPI     Home Medications Prior to Admission medications   Medication Sig Start Date End Date Taking? Authorizing Provider  metoprolol succinate (TOPROL XL) 25 MG 24 hr tablet Take 1 tablet (25 mg total) by mouth daily. 03/15/23   [place]ford, Anderson Malta, FNP  acetaminophen (TYLENOL) 500 MG tablet Take 500 mg by mouth every 6 (six) hours as needed for mild pain, headache or fever.    [provider]  Ascorbic Acid (VITAMIN C) 1000 MG tablet Take 1,000 mg by mouth daily.    [provider]  aspirin EC 81 MG tablet Take 1 tablet (81 mg total) by mouth daily. Swallow whole. 12/13/22   Iran Ouch, MD  atorvastatin (LIPITOR) 40 MG tablet Take 1 tablet (40 mg total) by  mouth every evening. 01/12/23 07/11/23  Iran Ouch, MD  Cholecalciferol (VITAMIN D3) 125 MCG (5000 UT) TABS Take 5,000 mcg by mouth daily.    [provider]  empagliflozin (JARDIANCE) 10 MG TABS tablet Take 1 tablet (10 mg total) by mouth daily before breakfast. 01/27/23   Sabharwal, Aditya, DO  ferrous sulfate 325 (65 FE) MG EC tablet Take 325 mg by mouth daily as needed (cold hands).    [provider]  fluticasone (FLONASE) 50 MCG/ACT nasal spray Place 2 sprays into both nostrils daily as needed for allergies or rhinitis.    [provider]  furosemide (LASIX) 40 MG tablet Take 1 tablet (40 mg total) by mouth daily as needed for edema or fluid (take if weight gain 2 to 3 lbs in 24 hrs or 5 lbs in 7 days.). Patient not taking: Reported on 03/13/2023 01/12/23   Iran Ouch, MD  latanoprost (XALATAN) 0.005 % ophthalmic solution Place 1 drop into both eyes at bedtime.  03/30/19   [provider]  sacubitril-valsartan (ENTRESTO) 24-26 MG Take 1 tablet by mouth 2 (two) times daily. 01/19/23   Sabharwal, Aditya, DO  spironolactone (ALDACTONE) 25 MG tablet Take 1 tablet (25 mg total) by mouth daily. 01/27/23   Sabharwal, Eliezer Lofts, DO      Allergies    Penicillins    Review  of Systems   Review of Systems  Constitutional:  Positive for fatigue. Negative for chills and fever.  Respiratory:  Negative for shortness of breath.   Cardiovascular:  Negative for chest pain.  Gastrointestinal:  Negative for abdominal pain.    Physical Exam Updated Vital Signs BP 138/81   Pulse 70   Temp 97.7 F (36.5 C)   Resp 16   Ht 5\' 3"  (1.6 m)   Wt 56.3 kg   SpO2 98%   BMI 21.99 kg/m  Physical Exam Vitals and nursing note reviewed.  Constitutional:      General: She is not in acute distress.    Appearance: Normal appearance. She is not ill-appearing or toxic-appearing.     Comments: Patient is awake, alert, and in no acute distress.   Eyes:     General: No scleral  icterus. Cardiovascular:     Rate and Rhythm: Normal rate.  Pulmonary:     Effort: Pulmonary effort is normal. No respiratory distress.     Breath sounds: Normal breath sounds.  Musculoskeletal:     Cervical back: Normal range of motion.     Right lower leg: No edema.     Left lower leg: No edema.  Skin:    General: Skin is warm and dry.  Neurological:     General: No focal deficit present.     Mental Status: She is alert. Mental status is at baseline.     Motor: No weakness.  Psychiatric:        Mood and Affect: Mood normal.     ED Results / Procedures / Treatments   Labs (all labs ordered are listed, but only abnormal results are displayed) Labs Reviewed  CBC WITH DIFFERENTIAL/PLATELET - Abnormal; Notable for the following components:      Result Value   WBC 3.3 (*)    Neutro Abs 1.5 (*)    All other components within normal limits  COMPREHENSIVE METABOLIC PANEL - Abnormal; Notable for the following components:   Glucose, Bld 109 (*)    BUN 24 (*)    All other components within normal limits    EKG EKG Interpretation  Date/Time:  Monday Mar 20 2023 18:00:38 EDT Ventricular Rate:  73 PR Interval:  172 QRS Duration: 83 QT Interval:  398 QTC Calculation: 439 R Axis:   -70 Text Interpretation: Sinus rhythm Consider left atrial enlargement Left anterior fascicular block Left ventricular hypertrophy Probable lateral infarct, age indeterminate ST elevation, consider inferior injury No significant change since last tracing Confirmed by Elayne Snare ([id number]) on 03/20/2023 7:07:38 PM  Radiology No results found.  Procedures Procedures    Medications Ordered in ED Medications - No data to display  ED Course/ Medical Decision Making/ A&P                            Medical Decision Making Amount and/or Complexity of Data Reviewed Labs: ordered.   77 y.o. female presents to the ER for evaluation of fatigue. Differential diagnosis includes but is not limited to  anemia, heart failure, malignancy, OSA, electrolyte abnormality. Vital signs unremarkable, no hypotension. Physical exam as noted above.   Basic lab work obtained.   I independently reviewed and interpreted the patient's labs.  Glucose at 109.  Mildly increased BUN at 24.  No other electrolyte or LFT abnormality.  CBC shows slight leukopenia at 3.3 with slight decrease in neutrophil count.  No anemia seen.  It appears  the patient was leukopenic last month as well during her lab results.  EKG reviewed and interpreted by attending and read as no significant change.  On previous chart review, I see the patient's been in contact with her provide her since 03-14-2023 feeling overmedicated, fatigue, low blood pressure.  From the last messages, it seems that they have asked that she discontinue her metoprolol and continue with the half of the Aldactone. She was recently seen by her PCP on 03/13/23 for complaints of fatigue and low BP.   The patient's BP on bilateral arms is within normal ranges. Her orthostatic vital signs are unremarkable as well. I had a long discussion with the patient about fatigue and her blood pressure. She has not been hypotensive since being here. I recommended that she invest in a new BP cuff, or bring her existing one to her next appointment to check calibration of it. I discussed with her that fatigue comes with a vary wide differential diagnosis, but her electrolytes and blood counts appear similar to her previous. I encouraged her to follow up with her cardiologist and PCP for additional testing and further discussion of this problem that has been going on for the past 3-4 weeks. I encouraged her to follow up for medication management. I do not see a need for any emergent additional workup for this problem.   We discussed the results of the labs/imaging. The plan is follow up with PCP and cardiologist, calibrate BP cuff. We discussed strict return precautions and red flag symptoms.  The patient verbalized their understanding and agrees to the plan. The patient is stable and being discharged home in good condition.  Portions of this report may have been transcribed using voice recognition software. Every effort was made to ensure accuracy; however, inadvertent computerized transcription errors may be present.   Final Clinical Impression(s) / ED Diagnoses Final diagnoses:  Other fatigue    Rx / DC Orders ED Discharge Orders     None         Achille Rich, PA-C 03/22/23 1440    Elayne Snare K, [place] 03/24/23 [phone]

## 2023-03-20 NOTE — ED Notes (Signed)
Pt. Reports she has been feeling fatigued for the last several days.  Pt. States the Dr. Has been changing her meds and she feels she has been taking too many due to the symptoms she is having.  Pt. Reports she needs to know more about her meds and if she needs really needs to take all of the ones she is on.

## 2023-03-20 NOTE — ED Triage Notes (Signed)
Pt states blood pressure has been low for 3-4 weeks  States was 85/79 earlier today  [facility], stopped carvedilol, 1/2 of spirinolactone,  States feels lethargic when waking up  BP normal at time of triage

## 2023-03-20 NOTE — Discharge Instructions (Addendum)
You were seen in the ER for evaluation of your fatigue.  From reading your charts, they recommend that you can stop taking your carvedilol and the metoprolol and can continue taking your half of your spironolactone.  Your blood work does not show any signs of anemia and your blood pressures have been great while here.  No signs of low blood pressure.  I recommend that she get a new blood pressure cuff and bring it to your next appointment so they can calibrate it with their medical equipment.  I recommend following up with your primary care office for further labs to workup your fatigue.  If you have any concerns, new or worsening symptoms, please return to the nearest emergency department for evaluation.  Contact a health care provider if: Your fatigue does not get better. You have a fever. You suddenly lose or gain weight. You have headaches. You have trouble falling asleep or sleeping through the night. You feel angry, guilty, anxious, or sad. You have swelling in your legs or another part of your body. Get help right away if: You feel confused, feel like you might faint, or faint. Your vision is blurry or you have a severe headache. You have severe pain in your abdomen, your back, or the area between your waist and hips (pelvis). You have chest pain, shortness of breath, or an irregular or fast heartbeat. You are unable to urinate, or you urinate less than normal. You have abnormal bleeding from the rectum, nose, lungs, nipples, or, if you are female, the vagina. You vomit blood. You have thoughts about hurting yourself or others. These symptoms may be an emergency. Get help right away. Call 911. Do not wait to see if the symptoms will go away. Do not drive yourself to the hospital. Get help right away if you feel like you may hurt yourself or others, or have thoughts about taking your own life. Go to your nearest emergency room or: Call 911. Call the National Suicide Prevention Lifeline  at 253-429-0640 or 988. This is open 24 hours a day. Text the Crisis Text Line at 9134383588.

## 2023-03-22 ENCOUNTER — Ambulatory Visit (HOSPITAL_COMMUNITY): Admission: RE | Admit: 2023-03-22 | Payer: Medicare HMO | Source: Ambulatory Visit

## 2023-03-22 ENCOUNTER — Ambulatory Visit
Admission: RE | Admit: 2023-03-22 | Discharge: 2023-03-22 | Disposition: A | Discharge status: Home / Self Care | Payer: Medicare HMO | Source: Ambulatory Visit | Attending: Family Medicine | Admitting: Family Medicine

## 2023-03-22 ENCOUNTER — Telehealth (HOSPITAL_COMMUNITY): Payer: Self-pay

## 2023-03-22 ENCOUNTER — Encounter (HOSPITAL_COMMUNITY): Payer: Medicare HMO

## 2023-03-22 DIAGNOSIS — R928 Other abnormal and inconclusive findings on diagnostic imaging of breast: Secondary | ICD-10-CM

## 2023-03-22 NOTE — Telephone Encounter (Signed)
Just FYI - patient did not show up for nuclear scan today. I tried to reach her for more information, but no answer

## 2023-03-23 ENCOUNTER — Other Ambulatory Visit: Payer: Self-pay | Admitting: Family Medicine

## 2023-03-23 DIAGNOSIS — R921 Mammographic calcification found on diagnostic imaging of breast: Secondary | ICD-10-CM

## 2023-03-24 ENCOUNTER — Telehealth: Payer: Self-pay | Admitting: Gastroenterology

## 2023-03-24 NOTE — Telephone Encounter (Signed)
Patient called, is currently seeing only [place] is no longer affiliated with [facility]. Patient believes she is having an acidic reaction to vitamins C, D. Records being sent from [facility] received.

## 2023-03-28 NOTE — Telephone Encounter (Signed)
Hi Dr. Barron Alvine,   We received records on this patient, she is requesting a transfer of care over to [facility] specifically over to you from [facility]. Records were scanned into Media for you to review and advise on scheduling.   Thanks

## 2023-03-30 NOTE — Telephone Encounter (Signed)
Called patient to schedule and she declined said she needs to be seen sooner and will go else where.

## 2023-04-11 ENCOUNTER — Telehealth: Payer: Self-pay | Admitting: Family Medicine

## 2023-04-11 NOTE — Telephone Encounter (Signed)
Patient sent a message requesting to transfer care from [facility] to [facility] here in [facility]. She did not give a reason to why. Is this ok?

## 2023-04-12 ENCOUNTER — Encounter: Payer: Self-pay | Admitting: Cardiovascular Disease

## 2023-04-13 ENCOUNTER — Telehealth (HOSPITAL_COMMUNITY): Payer: Self-pay

## 2023-04-13 NOTE — Telephone Encounter (Signed)
Patient called concerned about her blood pressure readings.  My Blood Pressure is not consistent.  All last week it was good 113/77   This morning took 3 readings all high Last one was 132/92   Inconsistency in my meds because of the fluctuations. This should not be happening.   Please respond! Thank you  The other night it was I do not see the Option to communicate with the [facility] my BP is 157/100 off the charts. She is not symptomatic. She is concerned about having breast biopsy next week because of BP readings.

## 2023-04-14 ENCOUNTER — Encounter (HOSPITAL_COMMUNITY): Payer: Self-pay

## 2023-04-14 NOTE — Telephone Encounter (Signed)
Response from patient:  U told me to stop the Metoprolol Now you're asking if I'm taking it???? Don't understand. I was prescribed it because my bp was running low. I want a comprehensive appointment to discuss this arsenal of pharmaceuticals back and forth. I as a patient would like a more conscious level of care.

## 2023-04-14 NOTE — Telephone Encounter (Signed)
Mychart message sent to patient and pharmacy appointment set up.

## 2023-04-14 NOTE — Telephone Encounter (Signed)
Called patient to make her aware of Laura's denial and to offer another provider. Patient was very upset stating she had called [place] and made the appointment with [place] and no one told her it had to be approved first. I informed patient that per her appointment, it looked like it was made through MyChart and once it was sent to [place] we saw that a TOC message was needed first. Patient stated she did not make it through MyChart and the appointment was made for her when she called [place]. I apologized to the patient for the confusion, and informed her that I would be reaching out to the manager to see what happened. Patient stated she was highly upset and would be reporting [place] because she believed she was being treated unfairly. I explained to the patient that while Vernona Rieger would not take her as a patient, I could send the [facility] to another provider and named the providers available. Patient requested their age and if they were Md's, to which I let her know that I was unable to give her their age but their profiles are on our website. I also informed her that we had no MD's at the moment taking new patients. Made patient aware that while the TOC was taking place, she could still see Dr. Doreene Burke while she found someone else. Patient stated she would not see him again, and that she would be reporting [place].

## 2023-04-14 NOTE — Telephone Encounter (Signed)
LMOM and mychart message sent.

## 2023-04-17 ENCOUNTER — Ambulatory Visit
Admission: RE | Admit: 2023-04-17 | Discharge: 2023-04-17 | Disposition: A | Discharge status: Home / Self Care | Payer: Medicare HMO | Source: Ambulatory Visit | Attending: Family Medicine | Admitting: Family Medicine

## 2023-04-17 DIAGNOSIS — R921 Mammographic calcification found on diagnostic imaging of breast: Secondary | ICD-10-CM

## 2023-04-17 HISTORY — PX: BREAST BIOPSY: SHX20

## 2023-04-17 NOTE — Telephone Encounter (Signed)
Pt is wanting to make sure she can keep seeing Dr Doreene Burke. She would like to eventually see a female doctor, but is very happy with Dr Doreene Burke.

## 2023-04-20 ENCOUNTER — Other Ambulatory Visit (HOSPITAL_COMMUNITY): Payer: Medicare HMO

## 2023-04-25 ENCOUNTER — Ambulatory Visit: Payer: Medicare HMO | Admitting: Family

## 2023-04-27 ENCOUNTER — Encounter: Payer: Self-pay | Admitting: Family Medicine

## 2023-04-27 ENCOUNTER — Ambulatory Visit (INDEPENDENT_AMBULATORY_CARE_PROVIDER_SITE_OTHER): Payer: Medicare HMO | Admitting: Family Medicine

## 2023-04-27 VITALS — BP 115/70 | HR 60 | Temp 97.7°F | Ht 63.0 in | Wt 125.0 lb

## 2023-04-27 DIAGNOSIS — R7303 Prediabetes: Secondary | ICD-10-CM

## 2023-04-27 NOTE — Progress Notes (Signed)
Established Patient Office Visit   Subjective:  Patient ID: Brooke Gray, female    DOB: August 12, 1946  Age: 77 y.o. MRN: [id number]  Chief Complaint  Patient presents with   Medical Management of Chronic Issues    3 month follow up, no concerns.     HPI Encounter Diagnoses  Name Primary?   Prediabetes Yes   For follow-up of above.  Continues empagliflozin for prediabetes.  Cardiac meds have been adjusted send the patient is no longer hypotensive.  Metoprolol is being held.  Continues iron and B12 supplementation.  Hiccups are improving but patient has a scheduled appointment with GI for evaluation.   Review of Systems  Constitutional: Negative.   HENT: Negative.    Eyes:  Negative for blurred vision, discharge and redness.  Respiratory: Negative.    Cardiovascular: Negative.   Gastrointestinal:  Negative for abdominal pain.  Genitourinary: Negative.   Musculoskeletal: Negative.  Negative for myalgias.  Skin:  Negative for rash.  Neurological:  Negative for tingling, loss of consciousness and weakness.  Endo/Heme/Allergies:  Negative for polydipsia.     Current Outpatient Medications:    acetaminophen (TYLENOL) 500 MG tablet, Take 500 mg by mouth every 6 (six) hours as needed for mild pain, headache or fever., Disp: , Rfl:    Ascorbic Acid (VITAMIN C) 1000 MG tablet, Take 1,000 mg by mouth daily., Disp: , Rfl:    aspirin EC 81 MG tablet, Take 1 tablet (81 mg total) by mouth daily. Swallow whole., Disp: 90 tablet, Rfl: 3   atorvastatin (LIPITOR) 40 MG tablet, Take 1 tablet (40 mg total) by mouth every evening., Disp: 30 tablet, Rfl: 5   Cholecalciferol (VITAMIN D3) 125 MCG (5000 UT) TABS, Take 5,000 mcg by mouth daily., Disp: , Rfl:    empagliflozin (JARDIANCE) 10 MG TABS tablet, Take 1 tablet (10 mg total) by mouth daily before breakfast., Disp: 30 tablet, Rfl: 8   ferrous sulfate 325 (65 FE) MG EC tablet, Take 325 mg by mouth daily as needed (cold hands)., Disp: , Rfl:     fluticasone (FLONASE) 50 MCG/ACT nasal spray, Place 2 sprays into both nostrils daily as needed for allergies or rhinitis., Disp: , Rfl:    latanoprost (XALATAN) 0.005 % ophthalmic solution, Place 1 drop into both eyes at bedtime. , Disp: , Rfl:    sacubitril-valsartan (ENTRESTO) 24-26 MG, Take 1 tablet by mouth 2 (two) times daily., Disp: 60 tablet, Rfl: 3   spironolactone (ALDACTONE) 25 MG tablet, Take 1 tablet (25 mg total) by mouth daily., Disp: 90 tablet, Rfl: 5   Objective:     BP 115/70 (BP Location: Right Arm, Patient Position: Sitting, Cuff Size: Normal)   Pulse 60   Temp 97.7 F (36.5 C) (Temporal)   Ht 5\' 3"  (1.6 m)   Wt 125 lb (56.7 kg)   SpO2 96%   BMI 22.14 kg/m    Physical Exam Constitutional:      General: She is not in acute distress.    Appearance: Normal appearance. She is not ill-appearing, toxic-appearing or diaphoretic.  HENT:     Head: Normocephalic and atraumatic.     Right Ear: External ear normal.     Left Ear: External ear normal.  Eyes:     General: No scleral icterus.       Right eye: No discharge.        Left eye: No discharge.     Extraocular Movements: Extraocular movements intact.     Conjunctiva/sclera: Conjunctivae  normal.  Cardiovascular:     Rate and Rhythm: Normal rate and regular rhythm.  Pulmonary:     Effort: Pulmonary effort is normal. No respiratory distress.     Breath sounds: Decreased air movement present.  Skin:    General: Skin is warm and dry.  Neurological:     Mental Status: She is alert and oriented to person, place, and time.  Psychiatric:        Mood and Affect: Mood normal.        Behavior: Behavior normal.      No results found for any visits on 04/27/23.    The 10-year ASCVD risk score (Arnett DK, et al., 2019) is: 27.8%    Assessment & Plan:   Prediabetes -     Basic metabolic panel -     Hemoglobin A1c    Return in about 6 months (around 10/27/2023), or if symptoms worsen or fail to improve.   Will continue Jardiance.  Will continue to follow iron and B12 levels.  Mliss Sax, MD

## 2023-04-28 LAB — BASIC METABOLIC PANEL
BUN: 18 mg/dL (ref 6–23)
CO2: 26 mEq/L (ref 19–32)
Calcium: 9.9 mg/dL (ref 8.4–10.5)
Chloride: 104 mEq/L (ref 96–112)
Creatinine, Ser: 0.76 mg/dL (ref 0.40–1.20)
GFR: 77.14 mL/min (ref >60.00)
Glucose, Bld: 92 mg/dL (ref 70–99)
Potassium: 4.1 mEq/L (ref 3.5–5.1)
Sodium: 138 mEq/L (ref 135–145)

## 2023-04-28 LAB — HEMOGLOBIN A1C: Hgb A1c MFr Bld: 5.8 % (ref 4.6–6.5)

## 2023-05-18 ENCOUNTER — Other Ambulatory Visit (HOSPITAL_COMMUNITY): Payer: Self-pay | Admitting: Cardiology

## 2023-05-19 ENCOUNTER — Encounter: Payer: Self-pay | Admitting: Family Medicine

## 2023-05-25 ENCOUNTER — Telehealth (HOSPITAL_COMMUNITY): Payer: Self-pay | Admitting: Cardiology

## 2023-05-25 NOTE — Telephone Encounter (Signed)
Patient called to report she has continued coreg as opposed to starting metoprolol as instructed in may.  Advised would confirm with provider if ok to continue and follow up appt made to review b/p and increased fatigue reported with coreg.     Follow up appt made 7/26 with Dr Suann Larry to refill coreg?

## 2023-05-25 NOTE — Telephone Encounter (Signed)
She is NOT taking metoprolol She IS currently taking COREG 3.125 BID

## 2023-05-25 NOTE — Telephone Encounter (Signed)
Please call and find out how much Toprol XL she is taking then we can decide.   Tyson Masin NP-C  10:18 AM

## 2023-05-25 NOTE — Telephone Encounter (Signed)
  She stopped coreg 03/20/23   Of note, she was set up for HF Pharmacy visit 04/20/2023 and cancelled.   PCP discontinued Toprol XL 04/27/23.   Please advise to stay off coreg for now. Bring all medications to the appointment with Dr Gasper Lloyd at the end of the month.   Zaakirah Kistner NP-C  10:50 AM

## 2023-05-27 ENCOUNTER — Other Ambulatory Visit (HOSPITAL_COMMUNITY): Payer: Self-pay | Admitting: Cardiology

## 2023-05-30 ENCOUNTER — Ambulatory Visit (HOSPITAL_COMMUNITY)
Admission: RE | Admit: 2023-05-30 | Discharge: 2023-05-30 | Disposition: A | Discharge status: Home / Self Care | Payer: Medicare HMO | Source: Ambulatory Visit | Attending: Cardiology | Admitting: Cardiology

## 2023-05-30 ENCOUNTER — Encounter (HOSPITAL_COMMUNITY): Payer: Self-pay | Admitting: Cardiology

## 2023-05-30 VITALS — BP 122/80 | HR 59 | Wt 126.8 lb

## 2023-05-30 DIAGNOSIS — Z7984 Long term (current) use of oral hypoglycemic drugs: Secondary | ICD-10-CM | POA: Insufficient documentation

## 2023-05-30 DIAGNOSIS — I480 Paroxysmal atrial fibrillation: Secondary | ICD-10-CM | POA: Diagnosis not present

## 2023-05-30 DIAGNOSIS — Z7982 Long term (current) use of aspirin: Secondary | ICD-10-CM | POA: Insufficient documentation

## 2023-05-30 DIAGNOSIS — I10 Essential (primary) hypertension: Secondary | ICD-10-CM | POA: Diagnosis not present

## 2023-05-30 DIAGNOSIS — E785 Hyperlipidemia, unspecified: Secondary | ICD-10-CM | POA: Diagnosis not present

## 2023-05-30 DIAGNOSIS — Z87891 Personal history of nicotine dependence: Secondary | ICD-10-CM | POA: Diagnosis not present

## 2023-05-30 DIAGNOSIS — Z8673 Personal history of transient ischemic attack (TIA), and cerebral infarction without residual deficits: Secondary | ICD-10-CM | POA: Diagnosis not present

## 2023-05-30 DIAGNOSIS — R0683 Snoring: Secondary | ICD-10-CM | POA: Insufficient documentation

## 2023-05-30 DIAGNOSIS — Z955 Presence of coronary angioplasty implant and graft: Secondary | ICD-10-CM | POA: Insufficient documentation

## 2023-05-30 DIAGNOSIS — Z79899 Other long term (current) drug therapy: Secondary | ICD-10-CM | POA: Diagnosis not present

## 2023-05-30 DIAGNOSIS — I5032 Chronic diastolic (congestive) heart failure: Secondary | ICD-10-CM | POA: Diagnosis not present

## 2023-05-30 DIAGNOSIS — I110 Hypertensive heart disease with heart failure: Secondary | ICD-10-CM | POA: Diagnosis not present

## 2023-05-30 DIAGNOSIS — I701 Atherosclerosis of renal artery: Secondary | ICD-10-CM

## 2023-05-30 DIAGNOSIS — Z7901 Long term (current) use of anticoagulants: Secondary | ICD-10-CM | POA: Diagnosis not present

## 2023-05-30 MED ORDER — CARVEDILOL 3.125 MG PO TABS: 3.1250 mg | ORAL_TABLET | Freq: Two times a day (BID) | ORAL | 3 refills | Status: DC

## 2023-05-30 NOTE — Progress Notes (Signed)
Blood collected for TTR genetic testing per Dr Daniel Nones.  Order form completed and both shipped by [facility] to [facility].

## 2023-05-30 NOTE — Patient Instructions (Addendum)
There has been no changes to your medications.  Genetic test has been done, this has to be sent to [place] to be processed and can take 1-2 weeks to get results back.  We will let you know the results.  You have been ordered a PYP Scan.  This is done in the Radiology Department of [facility].  When you come for this test please plan to be there 2-3 hours. ONCE YOUR INSURANCE HAS APPROVED THIS TEST YOU WILL BE CALLED TO HAVE THE TEST ARRANGED.   Your physician recommends that you schedule a follow-up appointment in: 2 months.  If you have any questions or concerns before your next appointment please send [place] a message through [place] or call our office at [phone].    TO LEAVE A MESSAGE FOR THE NURSE SELECT OPTION 2, PLEASE LEAVE A MESSAGE INCLUDING: YOUR NAME DATE OF BIRTH CALL BACK NUMBER REASON FOR CALL**this is important as we prioritize the call backs  YOU WILL RECEIVE A CALL BACK THE SAME DAY AS LONG AS YOU CALL BEFORE 4:00 PM  At the [facility], you and your health needs are our priority. As part of our continuing mission to provide you with exceptional heart care, we have created designated Provider Care Teams. These Care Teams include your primary Cardiologist (physician) and Advanced Practice Providers (APPs- Physician Assistants and Nurse Practitioners) who all work together to provide you with the care you need, when you need it.   You may see any of the following providers on your designated Care Team at your next follow up: Dr Arvilla Meres Dr Marca Ancona Dr. Marcos Eke, NP Robbie Lis, [place] [facility] [place], [place] Brynda Peon, NP Karle Plumber, PharmD   Please be sure to bring in all your medications bottles to every appointment.    Thank you for choosing [facility]

## 2023-05-30 NOTE — Telephone Encounter (Signed)
ADD ON 7/16 @ 140

## 2023-05-30 NOTE — Progress Notes (Signed)
ADVANCED HF CLINIC CLINIC NOTE  PCP: Mliss Sax, MD HF Cardiologist: Dr. Gasper Lloyd  HPI: 77 y/o female w/ h/o poorly controlled HTN, chronic diastolic dysfunction w/ severe LVH by echocardiogram, TIA, PAF, bradycardia, HLD, remote tobacco use and ascending aortic aneurysm.    Admitted (1/21) w/ TIA, BP noted to be markedly elevated. Echo showed normal EF 60-65% + severe LVH w/ mild speckled appearance, GIDD and normal RV. Wore heart monitor after discharge which detected afib. Was referred to Afib clinic and recommended to start DOAC. Started Eliquis. She had intolerance to high dose AVN blockers given bradycardia w/ occasional HRs in the 30s-40s. Unfortunately lost to f/u and not been seen by [facility] since 12/2019. Per Care Everywhere, was seen at [facility] 6/21 but no f/u since. Says she is now followed by [facility]. She has been on antihypertensives and diuretic therapy w/ torsemide.    Seen in the [facility] ED on 12/03/22 for acute dyspnea and respiratory distress, required BiPAP. Chest CT negative for PE. CXR showed pulmonary edema. BNP elevated 407. HS trop mildly elevated w/ flat trend, 237>>235. BP was elevated but not markedly high, 159/93. She was given dose of IV Lasix w/ good UOP and symptomatic response. Able to wean off BiPAP back to room air. Monitored in ED x 3 hrs post treatment w/ stable O2 sats and was discharged home.   Admitted 4 days later 12/07/22 with recurrent respiratory distress. Required BiPAP. BP elevated 197/113. Concern for recurrent flash pulmonary edema. Started on NTG gtt and given 80 mg IV Lasix. HsTroponin elevated. No chest pain or obvious ACS, elevation felt to be secondary to demain ischemia and hypertensive urgency. AHF consulted to assist with HF management. Echo showed  EF 60-65%, severe LVH, RV normal, trivial MR, mild TR. cMRI showed variant of hypertrophic cardiomyopathy LVEF 54% RV 55%. 2 week zio patch placed with  remote history of AFib. GDMT initially limited by AKI. GDMT titrated and she was discharged home, weight 125 lbs.  Underwent right renal artery stenting by Dr. Kirke Corin in 02/24.   Interval hx:   Back in May she became hypotensive and noticed increased fatigue. Coreg switched to metoprolol succinate and spiro was reduced.   There has since been confusion with multiple blood pressure medications. She took metoprolol for several days then went back to coreg. She is not taking evening dose of Entresto consistently and not taking spiro regularly until a few days ago. Her blood pressure has been averaging 140s-150s/90s-100s at home. She had one episode of dyspnea dancing a few days ago. Otherwise, she has been dancing and doing pilates/yoga several days a week without limitation. No orthopnea, PND or lower extremity edema. No dizziness.    Past Medical History:  Diagnosis Date   A-fib (HCC)    Cardiomegaly    Glaucoma    Hypertension    Current Outpatient Medications  Medication Sig Dispense Refill   acetaminophen (TYLENOL) 500 MG tablet Take 500 mg by mouth every 6 (six) hours as needed for mild pain, headache or fever.     Ascorbic Acid (VITAMIN C) 1000 MG tablet Take 1,000 mg by mouth daily.     aspirin EC 81 MG tablet Take 1 tablet (81 mg total) by mouth daily. Swallow whole. 90 tablet 3   atorvastatin (LIPITOR) 40 MG tablet Take 1 tablet (40 mg total) by mouth every evening. 30 tablet 5   Cholecalciferol (VITAMIN D3) 125 MCG (5000 UT) TABS Take 5,000  mcg by mouth daily.     empagliflozin (JARDIANCE) 10 MG TABS tablet Take 1 tablet (10 mg total) by mouth daily before breakfast. 30 tablet 8   ferrous sulfate 325 (65 FE) MG EC tablet Take 325 mg by mouth daily as needed (cold hands).     fluticasone (FLONASE) 50 MCG/ACT nasal spray Place 2 sprays into both nostrils daily as needed for allergies or rhinitis.     latanoprost (XALATAN) 0.005 % ophthalmic solution Place 1 drop into both eyes at  bedtime.      sacubitril-valsartan (ENTRESTO) 24-26 MG Take 1 tablet by mouth 2 (two) times daily. 60 tablet 3   spironolactone (ALDACTONE) 25 MG tablet Take 1 tablet (25 mg total) by mouth daily. 90 tablet 5   carvedilol (COREG) 3.125 MG tablet Take 1 tablet (3.125 mg total) by mouth 2 (two) times daily with a meal. 180 tablet 3   No current facility-administered medications for this encounter.   Allergies  Allergen Reactions   Penicillins Hives   Social History   Socioeconomic History   Marital status: Married    Spouse name: Not on file   Number of children: Not on file   Years of education: Not on file   Highest education level: Not on file  Occupational History   Not on file  Tobacco Use   Smoking status: Former    Current packs/day: 0.25    Average packs/day: 0.3 packs/day for 30.0 years (7.5 ttl pk-yrs)    Types: Cigarettes   Smokeless tobacco: Never  Vaping Use   Vaping status: Never Used  Substance and Sexual Activity   Alcohol use: Yes    Alcohol/week: 1.0 - 2.0 standard drink of alcohol    Types: 1 - 2 Glasses of wine per week    Comment: occasional   Drug use: No   Sexual activity: Not on file  Other Topics Concern   Not on file  Social History Narrative   Not on file   Social Determinants of Health   Financial Resource Strain: Low Risk  (09/02/2020)   Received from [facility]   Overall Financial Resource Strain (CARDIA)    Difficulty of Paying Living Expenses: Not hard at all  Food Insecurity: No Food Insecurity (12/08/2022)   Hunger Vital Sign    Worried About Running Out of Food in the Last Year: Never true    Ran Out of Food in the Last Year: Never true  Transportation Needs: No Transportation Needs (12/08/2022)   PRAPARE - Administrator, Civil Service (Medical): No    Lack of Transportation (Non-Medical): No  Physical Activity: Sufficiently Active (09/02/2020)   Received from [facility]   Exercise Vital Sign    Days of  Exercise per Week: 4 days    Minutes of Exercise per Session: 40 min  Stress: No Stress Concern Present (09/02/2020)   Received from [facility] of Occupational Health - Occupational Stress Questionnaire    Feeling of Stress : Not at all  Social Connections: Unknown (03/26/2022)   Received from [facility]   Social Network    Social Network: Not on file  Intimate Partner Violence: Not At Risk (12/08/2022)   Humiliation, Afraid, Rape, and Kick questionnaire    Fear of Current or Ex-Partner: No    Emotionally Abused: No    Physically Abused: No    Sexually Abused: No   Family History  Problem Relation Age of Onset   Allergic  rhinitis Son    Angioedema Neg Hx    Asthma Neg Hx    Eczema Neg Hx    Immunodeficiency Neg Hx    Urticaria Neg Hx    BP 122/80 (BP Location: Left Arm, Patient Position: Sitting, Cuff Size: Normal)   Pulse (!) 59   Wt 57.5 kg (126 lb 12.8 oz)   SpO2 98%   BMI 22.46 kg/m   Wt Readings from Last 3 Encounters:  05/30/23 57.5 kg (126 lb 12.8 oz)  04/27/23 56.7 kg (125 lb)  03/20/23 56.3 kg (124 lb 1.9 oz)   PHYSICAL EXAM: Vitals:   05/30/23 1343  BP: 122/80  Pulse: (!) 59  SpO2: 98%   General:  Well appearing. Appears younger than stated age HEENT: normal Neck: supple. no JVD. Carotids 2+ bilat; no bruits.  Cor: PMI nondisplaced. Regular rate & rhythm. No rubs, gallops or murmurs. Lungs: clear Abdomen: soft, nontender, nondistended.  Extremities: no cyanosis, clubbing, rash, edema Neuro: alert & orientedx3. Affect pleasant   Cardiac Studies - cMRI (1/24): LVEF 54%, severe asymmetric septal hypertrophy but no LVOT or mitral valve SAM, RVEF 55%, extensive non-coronary pattern mid-wall LGE, findings suggestive of variant of HCM, cannot fully rule out cardiac amyloidosis, but ECV < 40% less suggestive.  - Echo (1/24): EF 60-65%, severe LVH, RV normal, trivial MR, mild TR  - Heart Monitor (2/21)>>mostly SR at 55 bpm, episodes  of afib 2/22 and 2/23 with v rates of 160-180 bpm. 2 runs of VT of 4-5 beats.    - Echo (1/21): EF 60-65%, severe LVH w/ mild speckled appearance, grade I DD, normal RV  ASSESSMENT & PLAN: 1. Chronic Diastolic Heart Failure - Echo (2021): EF 60-65%, severe LVH w/ mild speckled appearance - Echo (1/24): EF 60-65%. Severe LVH. RV normal, trivial MR, mild TR - suspect hypertensive CM, though cannot r/o infiltrative CM (prior echo w/ speckled myocardium).  - cMRI (1/24): LVEF 54%, severe asymmetric septal hypertrophy but no LVOT or mitral valve SAM, RVEF 55%, extensive non-coronary pattern mid-wall LGE, findings suggestive of variant of HCM, cannot fully rule out cardiac amyloidosis, but ECV < 40% less suggestive. - Send [facility] gene testing to assess for HCM and TTR. Reordered PYP scan. - NYHA I, she is not volume overloaded today. Not requiring loop diuretic.  - Continue Coreg 3.125 - Continue Entresto 24/26 mg bid. - Continue Spiro 25 mg daily - Continue Jardiance 10 mg daily. - See below regarding possible medication adjustments - BMP stable in June, repeat labs next visit   2. Hypertension - long h/o poor control, despite reported med compliance - multiple presentations for flash pulmonary edema & hypertensive urgency. - S/p renal artery stenting 02/24 - BP recently elevated but has not been taking spironolactone or evening dose of Entresto consistently. She will take the meds as prescribed. If BP remains elevated after 2 weeks will increase Entresto further  3. RAS -Status post drug-eluting stent by Dr. Kirke Corin of the right renal artery. -Continue aspirin and statin   4. PAF - h/o TIA in 2001 w/ afib detection on Holter monitor.  - 2 week Zio 02/14: Sinus rhythm, 2 runs NSVT longest lasting 11 beats and 73 SVT runs longest lasting 5 mins and 59 seconds  5. Snoring -She has not completed home sleep study  6. Hyperlipidemia - Continue atorvastatin 40.  - LDL 143 in January 2024.  Repeat lipid panel at follow-up.   Follow-up: 2 months with Dr. Nilda Simmer, PA-C  2:37 PM

## 2023-06-09 ENCOUNTER — Encounter (HOSPITAL_COMMUNITY): Payer: Medicare HMO | Admitting: Cardiology

## 2023-06-12 ENCOUNTER — Other Ambulatory Visit (HOSPITAL_COMMUNITY): Payer: Self-pay | Admitting: Cardiology

## 2023-07-16 ENCOUNTER — Other Ambulatory Visit (HOSPITAL_COMMUNITY): Payer: Self-pay | Admitting: Cardiology

## 2023-07-31 ENCOUNTER — Ambulatory Visit (HOSPITAL_COMMUNITY)
Admission: RE | Admit: 2023-07-31 | Discharge: 2023-07-31 | Disposition: A | Discharge status: Home / Self Care | Payer: Medicare HMO | Source: Ambulatory Visit | Attending: Cardiology | Admitting: Cardiology

## 2023-07-31 ENCOUNTER — Encounter (HOSPITAL_COMMUNITY): Payer: Self-pay | Admitting: Cardiology

## 2023-07-31 VITALS — BP 118/80 | HR 60 | Wt 128.4 lb

## 2023-07-31 DIAGNOSIS — Z955 Presence of coronary angioplasty implant and graft: Secondary | ICD-10-CM | POA: Insufficient documentation

## 2023-07-31 DIAGNOSIS — G459 Transient cerebral ischemic attack, unspecified: Secondary | ICD-10-CM | POA: Diagnosis not present

## 2023-07-31 DIAGNOSIS — Z7902 Long term (current) use of antithrombotics/antiplatelets: Secondary | ICD-10-CM | POA: Insufficient documentation

## 2023-07-31 DIAGNOSIS — R0683 Snoring: Secondary | ICD-10-CM | POA: Insufficient documentation

## 2023-07-31 DIAGNOSIS — I10 Essential (primary) hypertension: Secondary | ICD-10-CM | POA: Diagnosis not present

## 2023-07-31 DIAGNOSIS — Z7984 Long term (current) use of oral hypoglycemic drugs: Secondary | ICD-10-CM | POA: Insufficient documentation

## 2023-07-31 DIAGNOSIS — Z79899 Other long term (current) drug therapy: Secondary | ICD-10-CM | POA: Diagnosis not present

## 2023-07-31 DIAGNOSIS — I701 Atherosclerosis of renal artery: Secondary | ICD-10-CM | POA: Diagnosis not present

## 2023-07-31 DIAGNOSIS — I480 Paroxysmal atrial fibrillation: Secondary | ICD-10-CM | POA: Diagnosis not present

## 2023-07-31 DIAGNOSIS — I5032 Chronic diastolic (congestive) heart failure: Secondary | ICD-10-CM | POA: Diagnosis not present

## 2023-07-31 DIAGNOSIS — I110 Hypertensive heart disease with heart failure: Secondary | ICD-10-CM | POA: Diagnosis not present

## 2023-07-31 DIAGNOSIS — I160 Hypertensive urgency: Secondary | ICD-10-CM | POA: Insufficient documentation

## 2023-07-31 DIAGNOSIS — Z8673 Personal history of transient ischemic attack (TIA), and cerebral infarction without residual deficits: Secondary | ICD-10-CM | POA: Insufficient documentation

## 2023-07-31 DIAGNOSIS — Z7901 Long term (current) use of anticoagulants: Secondary | ICD-10-CM | POA: Insufficient documentation

## 2023-07-31 DIAGNOSIS — Z7982 Long term (current) use of aspirin: Secondary | ICD-10-CM | POA: Insufficient documentation

## 2023-07-31 DIAGNOSIS — E785 Hyperlipidemia, unspecified: Secondary | ICD-10-CM | POA: Diagnosis not present

## 2023-07-31 LAB — LIPID PANEL
Cholesterol: 178 mg/dL (ref 0–200)
HDL: 73 mg/dL (ref >40)
LDL Cholesterol: 87 mg/dL (ref 0–99)
Total CHOL/HDL Ratio: 2.4 ratio
Triglycerides: 91 mg/dL (ref <150)
VLDL: 18 mg/dL (ref 0–40)

## 2023-07-31 LAB — COMPREHENSIVE METABOLIC PANEL
ALT: 22 U/L (ref 0–44)
AST: 26 U/L (ref 15–41)
Albumin: 3.9 g/dL (ref 3.5–5.0)
Alkaline Phosphatase: 51 U/L (ref 38–126)
Anion gap: 9 (ref 5–15)
BUN: 14 mg/dL (ref 8–23)
CO2: 28 mmol/L (ref 22–32)
Calcium: 9.6 mg/dL (ref 8.9–10.3)
Chloride: 104 mmol/L (ref 98–111)
Creatinine, Ser: 0.8 mg/dL (ref 0.44–1.00)
GFR, Estimated: >60 mL/min (ref >60)
Glucose, Bld: 91 mg/dL (ref 70–99)
Potassium: 4.4 mmol/L (ref 3.5–5.1)
Sodium: 141 mmol/L (ref 135–145)
Total Bilirubin: 1 mg/dL (ref 0.3–1.2)
Total Protein: 7.1 g/dL (ref 6.5–8.1)

## 2023-07-31 LAB — TSH: TSH: 0.818 u[IU]/mL (ref 0.350–4.500)

## 2023-07-31 LAB — MAGNESIUM: Magnesium: 2 mg/dL (ref 1.7–2.4)

## 2023-07-31 NOTE — Patient Instructions (Signed)
Medication Changes:  No Changes In Medications at this time.   Lab Work:  Labs done today, your results will be available in MyChart, we will contact you for abnormal readings.  Follow-Up in: 1 year PLEASE CALL OUR OFFICE AROUND July of 2025 TO GET SCHEDULED FOR YOUR APPOINTMENT. PHONE NUMBER IS [phone] OPTION 2    At the [facility], you and your health needs are our priority. We have a designated team specialized in the treatment of Heart Failure. This Care Team includes your primary Heart Failure Specialized Cardiologist (physician), Advanced Practice Providers (APPs- Physician Assistants and Nurse Practitioners), and Pharmacist who all work together to provide you with the care you need, when you need it.   You may see any of the following providers on your designated Care Team at your next follow up:  Dr. Arvilla Meres Dr. Marca Ancona Dr. Marcos Eke, NP Robbie Lis, [place] [facility] [place], [place] Brynda Peon, NP Karle Plumber, PharmD   Please be sure to bring in all your medications bottles to every appointment.   Need to Contact us:  If you have any questions or concerns before your next appointment please send [place] a message through [place] or call our office at [phone].    TO LEAVE A MESSAGE FOR THE NURSE SELECT OPTION 2, PLEASE LEAVE A MESSAGE INCLUDING: YOUR NAME DATE OF BIRTH CALL BACK NUMBER REASON FOR CALL**this is important as we prioritize the call backs  YOU WILL RECEIVE A CALL BACK THE SAME DAY AS LONG AS YOU CALL BEFORE 4:00 PM

## 2023-07-31 NOTE — Progress Notes (Signed)
ADVANCED HF CLINIC CLINIC NOTE  PCP: Mliss Sax, MD HF Cardiologist: Dr. Gasper Lloyd  HPI: 77 y/o female w/ h/o poorly controlled HTN, chronic diastolic dysfunction w/ severe LVH by echocardiogram, TIA, PAF, bradycardia, HLD, remote tobacco use and ascending aortic aneurysm.    Admitted (1/21) w/ TIA, BP noted to be markedly elevated. Echo showed normal EF 60-65% + severe LVH w/ mild speckled appearance, GIDD and normal RV. Wore heart monitor after discharge which detected afib. Was referred to Afib clinic and recommended to start DOAC. Started Eliquis. She had intolerance to high dose AVN blockers given bradycardia w/ occasional HRs in the 30s-40s. Unfortunately lost to f/u and not been seen by [facility] since 12/2019. Per Care Everywhere, was seen at [facility] 6/21 but no f/u since. Says she is now followed by [facility]. She has been on antihypertensives and diuretic therapy w/ torsemide.    Seen in the [facility] ED on 12/03/22 for acute dyspnea and respiratory distress, required BiPAP. Chest CT negative for PE. CXR showed pulmonary edema. BNP elevated 407. HS trop mildly elevated w/ flat trend, 237>>235. BP was elevated but not markedly high, 159/93. She was given dose of IV Lasix w/ good UOP and symptomatic response. Able to wean off BiPAP back to room air. Monitored in ED x 3 hrs post treatment w/ stable O2 sats and was discharged home.   Admitted 4 days later 12/07/22 with recurrent respiratory distress. Required BiPAP. BP elevated 197/113. Concern for recurrent flash pulmonary edema. Started on NTG gtt and given 80 mg IV Lasix. HsTroponin elevated. No chest pain or obvious ACS, elevation felt to be secondary to demain ischemia and hypertensive urgency. AHF consulted to assist with HF management. Echo showed  EF 60-65%, severe LVH, RV normal, trivial MR, mild TR. cMRI showed variant of hypertrophic cardiomyopathy LVEF 54% RV 55%. 2 week zio patch placed with  remote history of AFib. GDMT initially limited by AKI. GDMT titrated and she was discharged home, weight 125 lbs.  Interval hx:  Complaining of daily hiccups today which she is very frustrated by.  In addition she is complaining about constant gas and potential medication side effects.  From a heart failure and hypertension standpoint she is doing very well.  No longer has lower extremity edema.  No chest pain.  Minimal dyspnea.  And blood pressure is now well-controlled.   Past Medical History:  Diagnosis Date   A-fib (HCC)    Cardiomegaly    Glaucoma    Hypertension    Current Outpatient Medications  Medication Sig Dispense Refill   acetaminophen (TYLENOL) 500 MG tablet Take 500 mg by mouth every 6 (six) hours as needed for mild pain, headache or fever.     Ascorbic Acid (VITAMIN C) 1000 MG tablet Take 1,000 mg by mouth daily.     aspirin EC 81 MG tablet Take 1 tablet (81 mg total) by mouth daily. Swallow whole. 90 tablet 3   atorvastatin (LIPITOR) 40 MG tablet Take 1 tablet (40 mg total) by mouth every evening. 30 tablet 5   carvedilol (COREG) 3.125 MG tablet Take 1 tablet (3.125 mg total) by mouth 2 (two) times daily with a meal. 180 tablet 3   Cholecalciferol (VITAMIN D3) 125 MCG (5000 UT) TABS Take 5,000 mcg by mouth daily.     empagliflozin (JARDIANCE) 10 MG TABS tablet Take 1 tablet (10 mg total) by mouth daily before breakfast. 30 tablet 8   ferrous sulfate 325 (65  FE) MG EC tablet Take 325 mg by mouth daily as needed (cold hands).     fluticasone (FLONASE) 50 MCG/ACT nasal spray Place 2 sprays into both nostrils daily as needed for allergies or rhinitis.     latanoprost (XALATAN) 0.005 % ophthalmic solution Place 1 drop into both eyes at bedtime.      sacubitril-valsartan (ENTRESTO) 24-26 MG Take 1 tablet by mouth twice daily 60 tablet 1   spironolactone (ALDACTONE) 25 MG tablet Take 1 tablet (25 mg total) by mouth daily. 90 tablet 5   No current facility-administered  medications for this visit.   Allergies  Allergen Reactions   Penicillins Hives   Social History   Socioeconomic History   Marital status: Married    Spouse name: Not on file   Number of children: Not on file   Years of education: Not on file   Highest education level: Not on file  Occupational History   Not on file  Tobacco Use   Smoking status: Former    Current packs/day: 0.25    Average packs/day: 0.3 packs/day for 30.0 years (7.5 ttl pk-yrs)    Types: Cigarettes   Smokeless tobacco: Never  Vaping Use   Vaping status: Never Used  Substance and Sexual Activity   Alcohol use: Yes    Alcohol/week: 1.0 - 2.0 standard drink of alcohol    Types: 1 - 2 Glasses of wine per week    Comment: occasional   Drug use: No   Sexual activity: Not on file  Other Topics Concern   Not on file  Social History Narrative   Not on file   Social Determinants of Health   Financial Resource Strain: Low Risk  (09/02/2020)   Received from [facility], [facility]   Overall Financial Resource Strain (CARDIA)    Difficulty of Paying Living Expenses: Not hard at all  Food Insecurity: No Food Insecurity (12/08/2022)   Hunger Vital Sign    Worried About Running Out of Food in the Last Year: Never true    Ran Out of Food in the Last Year: Never true  Transportation Needs: No Transportation Needs (12/08/2022)   PRAPARE - Administrator, Civil Service (Medical): No    Lack of Transportation (Non-Medical): No  Physical Activity: Sufficiently Active (09/02/2020)   Received from [facility], [facility]   Exercise Vital Sign    Days of Exercise per Week: 4 days    Minutes of Exercise per Session: 40 min  Stress: No Stress Concern Present (09/02/2020)   Received from [facility], [facility] of Occupational Health - Occupational Stress Questionnaire    Feeling of Stress : Not at all  Social Connections: Unknown (03/26/2022)   Received from [facility],  [facility]   Social Network    Social Network: Not on file  Intimate Partner Violence: Not At Risk (12/08/2022)   Humiliation, Afraid, Rape, and Kick questionnaire    Fear of Current or Ex-Partner: No    Emotionally Abused: No    Physically Abused: No    Sexually Abused: No   Family History  Problem Relation Age of Onset   Allergic rhinitis Son    Angioedema Neg Hx    Asthma Neg Hx    Eczema Neg Hx    Immunodeficiency Neg Hx    Urticaria Neg Hx    There were no vitals taken for this visit.  Wt Readings from Last 3 Encounters:  05/30/23 57.5  kg (126 lb 12.8 oz)  04/27/23 56.7 kg (125 lb)  03/20/23 56.3 kg (124 lb 1.9 oz)   PHYSICAL EXAM: Vitals:   07/31/23 1447  BP: 118/80  Pulse: 60  SpO2: 100%   GENERAL: Well nourished, well developed, and in no apparent distress at rest.  HEENT: Negative for arcus senilis or xanthelasma. There is no scleral icterus.  The mucous membranes are pink and moist.   NECK: Supple, No masses. Normal carotid upstrokes without bruits. No masses or thyromegaly.    CHEST: There are no chest wall deformities. There is no chest wall tenderness. Respirations are unlabored.  Lungs- CTA B/L CARDIAC:  JVP: 7 cm          Normal rate with regular rhythm. No murmurs, rubs or gallops.  Pulses are 2+ and symmetrical in upper and lower extremities. No edema.  ABDOMEN: Soft, non-tender, non-distended. There are no masses or hepatomegaly. There are normal bowel sounds.  EXTREMITIES: Warm and well perfused with no cyanosis, clubbing.  LYMPHATIC: No axillary or supraclavicular lymphadenopathy.  NEUROLOGIC: Patient is oriented x3 with no focal or lateralizing neurologic deficits.  PSYCH: Patients affect is appropriate, there is no evidence of anxiety or depression.  SKIN: Warm and dry; no lesions or wounds.     ECG: SB 52 bpm (personally reviewed)  Cardiac Studies - cMRI (1/24): LVEF 54%, severe asymmetric septal hypertrophy but no LVOT or mitral valve SAM,  RVEF 55%, extensive non-coronary pattern mid-wall LGE, findings suggestive of variant of HCM, cannot fully rule out cardiac amyloidosis, but ECV < 40% less suggestive.  - Echo (1/24): EF 60-65%, severe LVH, RV normal, trivial MR, mild TR  - Heart Monitor (2/21)>>mostly SR at 55 bpm, episodes of afib 2/22 and 2/23 with v rates of 160-180 bpm. 2 runs of VT of 4-5 beats.    - Echo (1/21): EF 60-65%, severe LVH w/ mild speckled appearance, grade I DD, normal RV  ASSESSMENT & PLAN: 1. Chronic Diastolic Heart Failure - Echo (2021): EF 60-65%, severe LVH w/ mild speckled appearance - Echo (1/24): EF 60-65%. Severe LVH. RV normal, trivial MR, mild TR - suspect hypertensive CM, though cannot r/o infiltrative CM (prior echo w/ speckled myocardium).  - cMRI (1/24): LVEF 54%, severe asymmetric septal hypertrophy but no LVOT or mitral valve SAM, RVEF 55%, extensive non-coronary pattern mid-wall LGE, findings suggestive of variant of HCM, cannot fully rule out cardiac amyloidosis, but ECV < 40% less suggestive. - Send [facility] gene testing to assess for HCM and TTR, and arrange PYP scan. - NYHA I, she is not volume overloaded today. -Continue Coreg 3.125 - Continue Entresto 24/26mg  BID - She has been holding her spironolactone due to lightheadedness; she has now restarted 12.5mg  daily.  - Continue Jardiance 10 mg daily. - Continue Lasix 40 mg PRN. -BMP, BNP, TSH and lipid panel today.   2. Hypertension - long h/o poor control, despite reported med compliance - multiple presentations for flash pulmonary edema & hypertensive urgency. -Very well-controlled now after renal artery stenting.  Will continue to monitor.  3. RAS -Status post drug-eluting stent by Dr. Kirke Corin of the right renal artery. -Continue Plavix and aspirin   4. PAF - h/o TIA in 2001 w/ afib detection on Holter monitor.  - Zio 2 week in place.  -EKG normal sinus rhythm.  Ziopatch results pending.   5. Snoring -Sleep study  pending  6. Hyperlipidemia - Continue atorvastatin 40. LDL 143.  Reviewed and interpreted EKGs, echocardiogram personally. Reviewed angiography  of renal artery with patient.    Jade Burright 1:28 PM

## 2023-08-03 ENCOUNTER — Ambulatory Visit (HOSPITAL_COMMUNITY)
Admission: RE | Admit: 2023-08-03 | Discharge: 2023-08-03 | Disposition: A | Discharge status: Home / Self Care | Payer: Medicare HMO | Source: Ambulatory Visit | Attending: Cardiovascular Disease | Admitting: Cardiovascular Disease

## 2023-08-03 DIAGNOSIS — I701 Atherosclerosis of renal artery: Secondary | ICD-10-CM | POA: Diagnosis not present

## 2023-08-06 ENCOUNTER — Other Ambulatory Visit: Payer: Self-pay | Admitting: Cardiovascular Disease

## 2023-08-07 NOTE — Telephone Encounter (Signed)
Refill Request.  

## 2023-08-09 ENCOUNTER — Other Ambulatory Visit (HOSPITAL_COMMUNITY): Payer: Self-pay | Admitting: *Deleted

## 2023-08-09 DIAGNOSIS — I701 Atherosclerosis of renal artery: Secondary | ICD-10-CM

## 2023-08-14 ENCOUNTER — Telehealth: Payer: Self-pay | Admitting: Cardiovascular Disease

## 2023-08-14 NOTE — Telephone Encounter (Signed)
Patient states she is returning a call. 

## 2023-08-15 NOTE — Telephone Encounter (Signed)
Call was not placed by this nurse to the patient. There is no note in epic pertaining to a phone call.

## 2023-08-28 ENCOUNTER — Ambulatory Visit (INDEPENDENT_AMBULATORY_CARE_PROVIDER_SITE_OTHER): Payer: Medicare HMO

## 2023-08-28 DIAGNOSIS — Z0000 Encounter for general adult medical examination without abnormal findings: Secondary | ICD-10-CM | POA: Diagnosis not present

## 2023-08-28 NOTE — Progress Notes (Signed)
Subjective:   Brooke Gray is a 77 y.o. female who presents for Medicare Annual (Subsequent) preventive examination.  Visit Complete: Virtual I connected with  Andera Cranmer on 08/28/23 by a audio enabled telemedicine application and verified that I am speaking with the correct person using two identifiers.  Patient Location: Home  Provider Location: Office/Clinic  I discussed the limitations of evaluation and management by telemedicine. The patient expressed understanding and agreed to proceed.  Vital Signs: Because this visit was a virtual/telehealth visit, some criteria may be missing or patient reported. Any vitals not documented were not able to be obtained and vitals that have been documented are patient reported.  Patient Medicare AWV questionnaire was completed by the patient on 08/24/2023; I have confirmed that all information answered by patient is correct and no changes since this date.  Cardiac Risk Factors include: advanced age (>40men, >39 women);dyslipidemia;hypertension     Objective:    Today's Vitals   There is no height or weight on file to calculate BMI.     08/28/2023    3:29 PM 03/20/2023    5:58 PM 12/28/2022    6:58 AM 12/07/2022    3:46 AM 12/03/2022    2:13 AM 07/25/2021   10:08 PM 05/30/2020    9:36 PM  Advanced Directives  Does Patient Have a Medical Advance Directive? No No No No No No No  Would patient like information on creating a medical advance directive?   No - Patient declined No - Patient declined No - Patient declined Yes (ED - Information included in AVS)     Current Medications (verified) Outpatient Encounter Medications as of 08/28/2023  Medication Sig   acetaminophen (TYLENOL) 500 MG tablet Take 500 mg by mouth every 6 (six) hours as needed for mild pain, headache or fever.   Ascorbic Acid (VITAMIN C) 1000 MG tablet Take 1,000 mg by mouth daily.   aspirin EC 81 MG tablet Take 1 tablet (81 mg total) by mouth daily. Swallow whole.    atorvastatin (LIPITOR) 40 MG tablet TAKE 1 TABLET BY MOUTH ONCE DAILY IN THE EVENING   carvedilol (COREG) 3.125 MG tablet Take 1 tablet (3.125 mg total) by mouth 2 (two) times daily with a meal.   Cholecalciferol (VITAMIN D3) 125 MCG (5000 UT) TABS Take 5,000 mcg by mouth daily.   empagliflozin (JARDIANCE) 10 MG TABS tablet Take 1 tablet (10 mg total) by mouth daily before breakfast.   ferrous sulfate 325 (65 FE) MG EC tablet Take 325 mg by mouth daily as needed (cold hands).   fluticasone (FLONASE) 50 MCG/ACT nasal spray Place 2 sprays into both nostrils daily as needed for allergies or rhinitis.   latanoprost (XALATAN) 0.005 % ophthalmic solution Place 1 drop into both eyes at bedtime.    sacubitril-valsartan (ENTRESTO) 24-26 MG Take 1 tablet by mouth twice daily   spironolactone (ALDACTONE) 25 MG tablet Take 12.5 mg by mouth daily.   No facility-administered encounter medications on file as of 08/28/2023.    Allergies (verified) Penicillins   History: Past Medical History:  Diagnosis Date   A-fib (HCC)    Cardiomegaly    Glaucoma    Hypertension    Past Surgical History:  Procedure Laterality Date   BREAST BIOPSY Right 04/17/2023   MM RT BREAST BX W LOC DEV 1ST LESION IMAGE BX SPEC STEREO GUIDE 04/17/2023 [facility] MAMMOGRAPHY   no past surgery     RENAL ANGIOGRAPHY N/A 12/28/2022   Procedure: RENAL ANGIOGRAPHY;  Surgeon:  Iran Ouch, MD;  Location: [facility] INVASIVE CV LAB;  Service: Cardiovascular;  Laterality: N/A;   RENAL INTERVENTION  12/28/2022   Procedure: RENAL INTERVENTION;  Surgeon: Iran Ouch, MD;  Location: [facility] INVASIVE CV LAB;  Service: Cardiovascular;;  right renal   Family History  Problem Relation Age of Onset   Allergic rhinitis Son    Angioedema Neg Hx    Asthma Neg Hx    Eczema Neg Hx    Immunodeficiency Neg Hx    Urticaria Neg Hx    Social History   Socioeconomic History   Marital status: Married    Spouse name: Not on file   Number of children:  Not on file   Years of education: Not on file   Highest education level: Not on file  Occupational History   Not on file  Tobacco Use   Smoking status: Former    Current packs/day: 0.25    Average packs/day: 0.3 packs/day for 30.0 years (7.5 ttl pk-yrs)    Types: Cigarettes   Smokeless tobacco: Never  Vaping Use   Vaping status: Never Used  Substance and Sexual Activity   Alcohol use: Yes    Alcohol/week: 1.0 - 2.0 standard drink of alcohol    Types: 1 - 2 Glasses of wine per week    Comment: occasional   Drug use: No   Sexual activity: Not on file  Other Topics Concern   Not on file  Social History Narrative   Not on file   Social Determinants of Health   Financial Resource Strain: Low Risk  (08/24/2023)   Overall Financial Resource Strain (CARDIA)    Difficulty of Paying Living Expenses: Not hard at all  Food Insecurity: No Food Insecurity (08/24/2023)   Hunger Vital Sign    Worried About Running Out of Food in the Last Year: Never true    Ran Out of Food in the Last Year: Never true  Transportation Needs: No Transportation Needs (08/24/2023)   PRAPARE - Administrator, Civil Service (Medical): No    Lack of Transportation (Non-Medical): No  Physical Activity: Insufficiently Active (08/24/2023)   Exercise Vital Sign    Days of Exercise per Week: 2 days    Minutes of Exercise per Session: 60 min  Stress: No Stress Concern Present (08/24/2023)   [employer] of Occupational Health - Occupational Stress Questionnaire    Feeling of Stress : Not at all  Social Connections: Unknown (08/24/2023)   Social Connection and Isolation Panel [NHANES]    Frequency of Communication with Friends and Family: More than three times a week    Frequency of Social Gatherings with Friends and Family: Twice a week    Attends Religious Services: Not on Marketing executive or Organizations: Yes    Attends Engineer, structural: More than 4 times per year     Marital Status: Married    Tobacco Counseling Counseling given: Not Answered   Clinical Intake:  Pre-visit preparation completed: Yes  Pain : No/denies pain     Nutritional Risks: None Diabetes: No  How often do you need to have someone help you when you read instructions, pamphlets, or other written materials from your doctor or pharmacy?: 1 - Never  Interpreter Needed?: No  Information entered by :: NAllen LPN   Activities of Daily Living    08/24/2023    7:24 PM 12/08/2022    2:25 PM  In your present state  of health, do you have any difficulty performing the following activities:  Hearing? 0 0  Vision? 0 0  Difficulty concentrating or making decisions? 0 0  Walking or climbing stairs? 0 0  Dressing or bathing? 0 0  Doing errands, shopping? 0 0  Preparing Food and eating ? N   Using the Toilet? N   In the past six months, have you accidently leaked urine? N   Do you have problems with loss of bowel control? N   Managing your Medications? N   Managing your Finances? N   Housekeeping or managing your Housekeeping? N     Patient Care Team: Mliss Sax, MD as PCP - General (Family Medicine) Iran Ouch, MD as PCP - [facility] (Cardiology)  Indicate any recent Medical Services you may have received from other than [facility] providers in the past year (date may be approximate).     Assessment:   This is a routine wellness examination for Enna.  Hearing/Vision screen Hearing Screening - Comments:: Denies hearing issues Vision Screening - Comments:: Regular eye exams, [facility]   Goals Addressed             This Visit's Progress    Patient Stated       08/28/2023, increase workout       Depression Screen    08/28/2023    3:30 PM 04/27/2023    3:07 PM 03/13/2023   11:28 AM 01/24/2023    2:31 PM  PHQ 2/9 Scores  PHQ - 2 Score 0 0 0 0  PHQ- 9 Score 0       Fall Risk    08/24/2023    7:24 PM 04/27/2023    3:07 PM  03/13/2023   11:28 AM 01/24/2023    2:31 PM  Fall Risk   Falls in the past year? 0 0 0 0  Number falls in past yr: 0 0 0 0  Injury with Fall? 0 0 0 0  Risk for fall due to : Medication side effect No Fall Risks No Fall Risks No Fall Risks  Follow up Falls prevention discussed;Falls evaluation completed Falls evaluation completed Falls evaluation completed     MEDICARE RISK AT HOME: Medicare Risk at Home Any stairs in or around the home?: Yes If so, are there any without handrails?: No Home free of loose throw rugs in walkways, pet beds, electrical cords, etc?: No Adequate lighting in your home to reduce risk of falls?: Yes Life alert?: No Use of a cane, walker or w/c?: No Grab bars in the bathroom?: Yes Shower chair or bench in shower?: No Elevated toilet seat or a handicapped toilet?: No  TIMED UP AND GO:  Was the test performed?  No    Cognitive Function:        08/28/2023    3:30 PM  6CIT Screen  What Year? 0 points  What month? 0 points  What time? 3 points  Count back from 20 0 points  Months in reverse 0 points  Repeat phrase 0 points  Total Score 3 points    Immunizations Immunization History  Administered Date(s) Administered   Gamma Globulin 02/07/2014   PFIZER(Purple Top)SARS-COV-2 Vaccination 01/06/2020, 01/27/2020, 10/11/2020   Pneumococcal Polysaccharide-23 09/28/2020    TDAP status: Up to date  Flu Vaccine status: Declined, Education has been provided regarding the importance of this vaccine but patient still declined. Advised may receive this vaccine at local pharmacy or Health Dept. Aware to provide a  copy of the vaccination record if obtained from local pharmacy or Health Dept. Verbalized acceptance and understanding.  Pneumococcal vaccine status: Up to date  Covid-19 vaccine status: Information provided on how to obtain vaccines.   Qualifies for Shingles Vaccine? Yes   Zostavax completed No   Shingrix Completed?: No.    Education has been  provided regarding the importance of this vaccine. Patient has been advised to call insurance company to determine out of pocket expense if they have not yet received this vaccine. Advised may also receive vaccine at local pharmacy or Health Dept. Verbalized acceptance and understanding.  Screening Tests Health Maintenance  Topic Date Due   Zoster Vaccines- Shingrix (1 of 2) Never done   INFLUENZA VACCINE  Never done   Pneumonia Vaccine 61+ Years old (2 of 2 - PCV) 04/26/2024 (Originally 09/28/2021)   Medicare Annual Wellness (AWV)  08/27/2024   MAMMOGRAM  03/21/2025   Colonoscopy  08/23/2032   DEXA SCAN  Completed   Hepatitis C Screening  Completed   HPV VACCINES  Aged Out   DTaP/Tdap/Td  Discontinued   COVID-19 Vaccine  Discontinued    Health Maintenance  Health Maintenance Due  Topic Date Due   Zoster Vaccines- Shingrix (1 of 2) Never done   INFLUENZA VACCINE  Never done    Colorectal cancer screening: Type of screening: Colonoscopy. Completed 08/23/2022. Repeat every 10 years  Mammogram status: Completed 03/22/2023. Repeat every year  Bone Density status: Completed 08/23/2022.   Lung Cancer Screening: (Low Dose CT Chest recommended if Age 105-80 years, 20 pack-year currently smoking OR have quit w/in 15years.) does not qualify.   Lung Cancer Screening Referral: no  Additional Screening:  Hepatitis C Screening: does qualify; Completed 12/07/2022  Vision Screening: Recommended annual ophthalmology exams for early detection of glaucoma and other disorders of the eye. Is the patient up to date with their annual eye exam?  Yes  Who is the provider or what is the name of the office in which the patient attends annual eye exams? [facility] If pt is not established with a provider, would they like to be referred to a provider to establish care? No .   Dental Screening: Recommended annual dental exams for proper oral hygiene  Diabetic Foot Exam: n/a  Community  Resource Referral / Chronic Care Management: CRR required this visit?  No   CCM required this visit?  No     Plan:     I have personally reviewed and noted the following in the patient's chart:   Medical and social history Use of alcohol, tobacco or illicit drugs  Current medications and supplements including opioid prescriptions. Patient is not currently taking opioid prescriptions. Functional ability and status Nutritional status Physical activity Advanced directives List of other physicians Hospitalizations, surgeries, and ER visits in previous 12 months Vitals Screenings to include cognitive, depression, and falls Referrals and appointments  In addition, I have reviewed and discussed with patient certain preventive protocols, quality metrics, and best practice recommendations. A written personalized care plan for preventive services as well as general preventive health recommendations were provided to patient.     Barb Merino, LPN   [id number]   After Visit Summary: (MyChart) Due to this being a telephonic visit, the after visit summary with patients personalized plan was offered to patient via MyChart   Nurse Notes: none

## 2023-08-28 NOTE — Patient Instructions (Signed)
Brooke Gray , Thank you for taking time to come for your Medicare Wellness Visit. I appreciate your ongoing commitment to your health goals. Please review the following plan we discussed and let me know if I can assist you in the future.   Referrals/Orders/Follow-Ups/Clinician Recommendations: none  This is a list of the screening recommended for you and due dates:  Health Maintenance  Topic Date Due   Zoster (Shingles) Vaccine (1 of 2) Never done   Flu Shot  Never done   Pneumonia Vaccine (2 of 2 - PCV) 04/26/2024*   Medicare Annual Wellness Visit  08/27/2024   Mammogram  03/21/2025   Colon Cancer Screening  08/23/2032   DEXA scan (bone density measurement)  Completed   Hepatitis C Screening  Completed   HPV Vaccine  Aged Out   DTaP/Tdap/Td vaccine  Discontinued   COVID-19 Vaccine  Discontinued  *Topic was postponed. The date shown is not the original due date.    Advanced directives: (ACP Link)Information on Advanced Care Planning can be found at [facility] [place] Advance Health Care Directives Advance Health Care Directives ([email or web address]   Next Medicare Annual Wellness Visit scheduled for next year: Yes  Insert Preventive Care attachment Insert FALL PREVENTION attachment if needed

## 2023-12-28 DIAGNOSIS — H401112 Primary open-angle glaucoma, right eye, moderate stage: Secondary | ICD-10-CM | POA: Diagnosis not present

## 2023-12-28 DIAGNOSIS — H353131 Nonexudative age-related macular degeneration, bilateral, early dry stage: Secondary | ICD-10-CM | POA: Diagnosis not present

## 2023-12-28 DIAGNOSIS — H2513 Age-related nuclear cataract, bilateral: Secondary | ICD-10-CM | POA: Diagnosis not present

## 2023-12-28 DIAGNOSIS — H401121 Primary open-angle glaucoma, left eye, mild stage: Secondary | ICD-10-CM | POA: Diagnosis not present

## 2024-01-01 ENCOUNTER — Other Ambulatory Visit: Payer: Self-pay | Admitting: Family Medicine

## 2024-01-01 DIAGNOSIS — Z09 Encounter for follow-up examination after completed treatment for conditions other than malignant neoplasm: Secondary | ICD-10-CM

## 2024-01-08 NOTE — Progress Notes (Deleted)
 Cardiology Office Note   Date:  01/08/2024   ID:  Maddyx, Vallie 1946/10/08, MRN [id number]  PCP:  Mliss Sax, MD  Cardiologist:   Dr. Gasper Lloyd  No chief complaint on file.     History of Present Illness: Brooke Gray is a 78 y.o. female who is here today for follow-up visit after recent right renal artery stenting.    She has known history of poorly controlled hypertension, chronic diastolic heart failure, severe left ventricular hypertrophy on echo, TIA, questionable paroxysmal atrial fibrillation, hyperlipidemia, previous tobacco use and ascending aortic aneurysm.  She was seen last month for severe right renal artery stenosis with refractory hypertension and recurrent pulmonary edema.  Angiography was performed on February 14 which confirmed severe ostial right renal artery stenosis.  I performed successful angioplasty and stent placement.  She has been doing well since then but reports persistent hiccups which started 2 weeks after the procedure.  A follow-up renal artery duplex showed patent right renal artery.  She is doing well overall but Is concerned about taking too many medications.  Past Medical History:  Diagnosis Date   A-fib [facility])    Cardiomegaly    Glaucoma    Hypertension     Past Surgical History:  Procedure Laterality Date   BREAST BIOPSY Right 04/17/2023   MM RT BREAST BX W LOC DEV 1ST LESION IMAGE BX SPEC STEREO GUIDE 04/17/2023 [facility]   no past surgery     RENAL ANGIOGRAPHY N/A 12/28/2022   Procedure: RENAL ANGIOGRAPHY;  Surgeon: Iran Ouch, MD;  [facility]: [facility];  Service: Cardiovascular;  Laterality: N/A;   RENAL INTERVENTION  12/28/2022   Procedure: RENAL INTERVENTION;  Surgeon: Iran Ouch, MD;  Location: [facility];  Service: Cardiovascular;;  right renal     Current Outpatient Medications  Medication Sig Dispense Refill   acetaminophen (TYLENOL) 500 MG tablet Take 500 mg by  mouth every 6 (six) hours as needed for mild pain, headache or fever.     Ascorbic Acid (VITAMIN C) 1000 MG tablet Take 1,000 mg by mouth daily.     aspirin EC 81 MG tablet Take 1 tablet (81 mg total) by mouth daily. Swallow whole. 90 tablet 3   atorvastatin (LIPITOR) 40 MG tablet TAKE 1 TABLET BY MOUTH ONCE DAILY IN THE EVENING 90 tablet 2   carvedilol (COREG) 3.125 MG tablet Take 1 tablet (3.125 mg total) by mouth 2 (two) times daily with a meal. 180 tablet 3   Cholecalciferol (VITAMIN D3) 125 MCG (5000 UT) TABS Take 5,000 mcg by mouth daily.     empagliflozin (JARDIANCE) 10 MG TABS tablet Take 1 tablet (10 mg total) by mouth daily before breakfast. 30 tablet 8   ferrous sulfate 325 (65 FE) MG EC tablet Take 325 mg by mouth daily as needed (cold hands).     fluticasone (FLONASE) 50 MCG/ACT nasal spray Place 2 sprays into both nostrils daily as needed for allergies or rhinitis.     latanoprost (XALATAN) 0.005 % ophthalmic solution Place 1 drop into both eyes at bedtime.      sacubitril-valsartan (ENTRESTO) 24-26 MG Take 1 tablet by mouth twice daily 60 tablet 1   spironolactone (ALDACTONE) 25 MG tablet Take 12.5 mg by mouth daily.     No current facility-administered medications for this visit.    Allergies:   Penicillins    Social History:  The patient  reports that she has quit smoking. Her  smoking use included cigarettes. She has a 7.5 pack-year smoking history. She has never used smokeless tobacco. She reports current alcohol use of about 1.0 - 2.0 standard drink of alcohol per week. She reports that she does not use drugs.   Family History:  The patient's family history includes Allergic rhinitis in her son.    ROS:  Please see the history of present illness.   Otherwise, review of systems are positive for none.   All other systems are reviewed and negative.    PHYSICAL EXAM: VS:  There were no vitals taken for this visit. , BMI There is no height or weight on file to calculate  BMI. GEN: Well nourished, well developed, in no acute distress  HEENT: normal  Neck: no JVD, carotid bruits, or masses Cardiac: RRR; no murmurs, rubs, or gallops,no edema  Respiratory:  clear to auscultation bilaterally, normal work of breathing GI: soft, nontender, nondistended, + BS MS: no deformity or atrophy  Skin: warm and dry, no rash Neuro:  Strength and sensation are intact Psych: euthymic mood, full affect No groin hematoma.  EKG:  EKG is not ordered today.    Recent Labs: 03/20/2023: Hemoglobin 12.6; Platelets 169 07/31/2023: ALT 22; BUN 14; Creatinine, Ser 0.80; Magnesium 2.0; Potassium 4.4; Sodium 141; TSH 0.818    Lipid Panel    Component Value Date/Time   CHOL 178 07/31/2023 1531   TRIG 91 07/31/2023 1531   HDL 73 07/31/2023 1531   CHOLHDL 2.4 07/31/2023 1531   VLDL 18 07/31/2023 1531   LDLCALC 87 07/31/2023 1531      Wt Readings from Last 3 Encounters:  07/31/23 128 lb 6.4 oz (58.2 kg)  05/30/23 126 lb 12.8 oz (57.5 kg)  04/27/23 125 lb (56.7 kg)           No data to display            ASSESSMENT AND PLAN:  1.  Renal artery stenosis: Most recent right renal artery stenting with excellent results.  Follow-up renal artery duplex showed patent stent with no significant restenosis.  Repeat study in 6 months.  Continue aspirin 81 mg once daily.   Her hiccups could be related to clopidogrel and given that it has been more than 1 month since the procedure, I elected to discontinue clopidogrel today.  2.  Chronic diastolic heart failure: She appears to be euvolemic without any diuretics.  She is on optimal medications.  3.  Hyperlipidemia: Continue atorvastatin 40 mg daily with a target LDL of less than 70.  4.  Essential hypertension: Blood pressure is controlled on current medications.    Disposition:   Continue regular follow-up in the heart failure clinic.  Follow-up with me in 1 year.   Signed,  Lorine Bears, MD  01/08/2024 6:24 PM     Brooke Gray

## 2024-01-09 ENCOUNTER — Ambulatory Visit: Payer: Medicare HMO | Admitting: Cardiovascular Disease

## 2024-01-16 ENCOUNTER — Ambulatory Visit: Payer: Medicare HMO | Attending: Cardiovascular Disease | Admitting: Cardiovascular Disease

## 2024-01-16 ENCOUNTER — Encounter: Payer: Self-pay | Admitting: Cardiovascular Disease

## 2024-01-16 ENCOUNTER — Other Ambulatory Visit (HOSPITAL_COMMUNITY): Payer: Self-pay | Admitting: Cardiology

## 2024-01-16 VITALS — BP 134/94 | HR 61 | Ht 63.0 in | Wt 130.4 lb

## 2024-01-16 DIAGNOSIS — I5032 Chronic diastolic (congestive) heart failure: Secondary | ICD-10-CM | POA: Diagnosis not present

## 2024-01-16 DIAGNOSIS — I701 Atherosclerosis of renal artery: Secondary | ICD-10-CM | POA: Diagnosis not present

## 2024-01-16 DIAGNOSIS — E785 Hyperlipidemia, unspecified: Secondary | ICD-10-CM

## 2024-01-16 DIAGNOSIS — I10 Essential (primary) hypertension: Secondary | ICD-10-CM | POA: Diagnosis not present

## 2024-01-16 MED ORDER — SPIRONOLACTONE 25 MG PO TABS: 25.0000 mg | ORAL_TABLET | Freq: Every day | ORAL | 3 refills | Status: DC

## 2024-01-16 MED ORDER — EZETIMIBE 10 MG PO TABS: 10.0000 mg | ORAL_TABLET | Freq: Every day | ORAL | 3 refills | Status: AC

## 2024-01-16 NOTE — Patient Instructions (Signed)
 Medication Instructions:  Your physician recommends the following medication changes.  STOP TAKING: Carvedilol Atorvastatin  START TAKING: Ezetimibe (Zetia) 10 mg once daily  INCREASE: Spironolactone to 25 mg once daily  *If you need a refill on your cardiac medications before your next appointment, please call your pharmacy*   Lab Work: Your provider would like for you to return in 6 weeks to have the following labs drawn: fasting Lipid and CMET. You do not need an appointment for the lab. Once in our office lobby there is a podium where you can sign in and ring the doorbell to alert [place] that you are here. The lab is open from 8:00 am to 4 pm; closed for lunch from 12:45pm-1:45pm.  You may also go to any of these [facility] locations:   [facility] - [address] ([facility]) - [address] [id number] [address]   [place] - [address]    [place]  - [address]    [place] - [address] - [address] [employer]  - [address] - [address] ([facility])    Follow-Up: At [facility], you and your health needs are our priority.  As part of our continuing mission to provide you with exceptional heart care, we have created designated Provider Care Teams.  These Care Teams include your primary Cardiologist (physician) and Advanced Practice Providers (APPs -  Physician Assistants and Nurse Practitioners) who all work together to provide you with the care you need, when you need it.  We recommend signing up for the patient portal called "MyChart".  Sign up information is provided on this After Visit Summary.  MyChart is used to connect with patients for Virtual Visits (Telemedicine).  Patients are able to view lab/test results, encounter notes, upcoming appointments, etc.  Non-urgent messages can be sent to your provider as well.   To  learn more about what you can do with MyChart, go to [email or web address].    Your next appointment:   12 month(s)  Provider:   Dr. Kirke Corin

## 2024-01-16 NOTE — Progress Notes (Signed)
 Cardiology Office Note   Date:  01/16/2024   ID:  Brooke Gray, DOB Mar 24, 1946, MRN [id number]  PCP:  Mliss Sax, MD  Cardiologist:   Dr. Gasper Lloyd  No chief complaint on file.     History of Present Illness: Brooke Gray is a 78 y.o. female who is here today for follow-up visit regarding renal artery stenosis status post right renal artery stenting.    She has known history of poorly controlled hypertension, chronic diastolic heart failure, severe left ventricular hypertrophy on echo, TIA, questionable paroxysmal atrial fibrillation, hyperlipidemia, previous tobacco use and ascending aortic aneurysm.  She was seen in 2024 for severe right renal artery stenosis with refractory hypertension and recurrent pulmonary edema.  Angiography was performed in February 2024 which confirmed severe ostial right renal artery stenosis.  I performed successful angioplasty and stent placement.   Most recent renal artery duplex in September 2024 showed patent right renal artery stent.  She has multiple concerns about medications.  She is having significant hair loss and she thinks it is related to atorvastatin.  She also complains of fatigue and difficulty sleeping when she takes carvedilol.  She has difficulty splitting the dose of spironolactone. Otherwise, she has been doing reasonably well with no chest pain or shortness of breath.   Past Medical History:  Diagnosis Date   A-fib [facility])    Cardiomegaly    Glaucoma    Hypertension     Past Surgical History:  Procedure Laterality Date   BREAST BIOPSY Right 04/17/2023   MM RT BREAST BX W LOC DEV 1ST LESION IMAGE BX SPEC STEREO GUIDE 04/17/2023 [facility] M[facility]MO[facility]   no past surgery     RENAL ANGIOGRAPHY N/A 12/28/2022   Procedure: RENAL ANGIOGRAPHY;  Surgeon: Iran Ouch, MD;  Location: [facility];  Service: Cardiovascular;  Laterality: N/A;   RENAL INTERVENTION  12/28/2022   Procedure: RENAL INTERVENTION;  Surgeon:  Iran Ouch, MD;  Location: [facility];  Service: Cardiovascular;;  right renal     Current Outpatient Medications  Medication Sig Dispense Refill   Ascorbic Acid (VITAMIN C) 1000 MG tablet Take 1,000 mg by mouth daily.     aspirin EC 81 MG tablet Take 1 tablet (81 mg total) by mouth daily. Swallow whole. 90 tablet 3   atorvastatin (LIPITOR) 40 MG tablet TAKE 1 TABLET BY MOUTH ONCE DAILY IN THE EVENING 90 tablet 2   carvedilol (COREG) 3.125 MG tablet Take 1 tablet (3.125 mg total) by mouth 2 (two) times daily with a meal. 180 tablet 3   Cholecalciferol (VITAMIN D3) 125 MCG (5000 UT) TABS Take 5,000 mcg by mouth daily.     empagliflozin (JARDIANCE) 10 MG TABS tablet Take 1 tablet (10 mg total) by mouth daily before breakfast. 30 tablet 8   ferrous sulfate 325 (65 FE) MG EC tablet Take 325 mg by mouth daily as needed (cold hands).     latanoprost (XALATAN) 0.005 % ophthalmic solution Place 1 drop into both eyes at bedtime.      sacubitril-valsartan (ENTRESTO) 24-26 MG Take 1 tablet by mouth twice daily 180 tablet 2   spironolactone (ALDACTONE) 25 MG tablet Take 12.5 mg by mouth daily.     acetaminophen (TYLENOL) 500 MG tablet Take 500 mg by mouth every 6 (six) hours as needed for mild pain, headache or fever. (Patient not taking: Reported on 01/16/2024)     fluticasone (FLONASE) 50 MCG/ACT nasal spray Place 2 sprays into both nostrils daily  as needed for allergies or rhinitis. (Patient not taking: Reported on 01/16/2024)     No current facility-administered medications for this visit.    Allergies:   Penicillins    Social History:  The patient  reports that she has quit smoking. Her smoking use included cigarettes. She has a 7.5 pack-year smoking history. She has never used smokeless tobacco. She reports current alcohol use of about 1.0 - 2.0 standard drink of alcohol per week. She reports that she does not use drugs.   Family History:  The patient's family history includes  Allergic rhinitis in her son.    ROS:  Please see the history of present illness.   Otherwise, review of systems are positive for none.   All other systems are reviewed and negative.    PHYSICAL EXAM: VS:  BP (!) 134/94   Pulse 61   Ht 5\' 3"  (1.6 m)   Wt 130 lb 6.4 oz (59.1 kg)   SpO2 99%   BMI 23.10 kg/m  , BMI Body mass index is 23.1 kg/m. GEN: Well nourished, well developed, in no acute distress  HEENT: normal  Neck: no JVD, carotid bruits, or masses Cardiac: RRR; no murmurs, rubs, or gallops,no edema  Respiratory:  clear to auscultation bilaterally, normal work of breathing GI: soft, nontender, nondistended, + BS MS: no deformity or atrophy  Skin: warm and dry, no rash Neuro:  Strength and sensation are intact Psych: euthymic mood, full affect   EKG:  EKG is ordered today. EKG showed:  Normal sinus rhythm  with sinus arrhythmia Left anterior fascicular block Left ventricular hypertrophy ( R in aVL , Cornell product , Romhilt-Estes ) Cannot rule out Septal infarct , age undetermined     Recent Labs: 03/20/2023: Hemoglobin 12.6; Platelets 169 07/31/2023: ALT 22; BUN 14; Creatinine, Ser 0.80; Magnesium 2.0; Potassium 4.4; Sodium 141; TSH 0.818    Lipid Panel    Component Value Date/Time   CHOL 178 07/31/2023 1531   TRIG 91 07/31/2023 1531   HDL 73 07/31/2023 1531   CHOLHDL 2.4 07/31/2023 1531   VLDL 18 07/31/2023 1531   LDLCALC 87 07/31/2023 1531      Wt Readings from Last 3 Encounters:  01/16/24 130 lb 6.4 oz (59.1 kg)  07/31/23 128 lb 6.4 oz (58.2 kg)  05/30/23 126 lb 12.8 oz (57.5 kg)           No data to display            ASSESSMENT AND PLAN:  1.  Renal artery stenosis status post stenting: Most recent duplex in September showed patent stent with no significant restenosis.  Repeat study in September of this year.  Continue aspirin 81 mg daily.    2.  Chronic diastolic heart failure: She appears to be euvolemic without any diuretics.  She is  on optimal medications.  However, she reports fatigue and difficulty sleeping when she takes carvedilol.  She is only on a small dose anyway and thus I discontinued the medication and increased spironolactone to 25 mg daily.  3.  Hyperlipidemia: She reports hair loss since she was started on atorvastatin and she wants to come off statin medications.  She improved her lifestyle.  I elected to switch to ezetimibe 10 mg daily.  Check lipid profile and CMP in 6 weeks.  4.  Essential hypertension: I made changes as outlined above.    Disposition:   Follow-up with me in 1 year.   Signed,  Lorine Bears, MD  01/16/2024 8:41 AM    [facility]

## 2024-01-26 ENCOUNTER — Other Ambulatory Visit (HOSPITAL_COMMUNITY): Payer: Self-pay | Admitting: Cardiology

## 2024-02-06 ENCOUNTER — Telehealth (HOSPITAL_COMMUNITY): Payer: Self-pay

## 2024-02-06 NOTE — Telephone Encounter (Signed)
 Advanced Heart Failure Patient Advocate Encounter  The patient was approved for a [employer] grant that will help cover the cost of Entresto, Jardiance, Spironolactone.  Total amount awarded, $10,000.  Effective: 01/07/2024 - 01/05/2025.  BIN [id number] PCN [id number] Group [id number] ID [id number]  Pharmacy provided with approval and processing information. Patient informed via voicemail.  Burnell Blanks, CPhT Rx Patient Advocate Phone: [phone]

## 2024-02-22 ENCOUNTER — Telehealth (HOSPITAL_COMMUNITY): Payer: Self-pay | Admitting: Cardiology

## 2024-03-12 ENCOUNTER — Other Ambulatory Visit: Payer: Self-pay | Admitting: Family Medicine

## 2024-03-12 DIAGNOSIS — R921 Mammographic calcification found on diagnostic imaging of breast: Secondary | ICD-10-CM

## 2024-03-18 ENCOUNTER — Ambulatory Visit
Admission: RE | Admit: 2024-03-18 | Discharge: 2024-03-18 | Disposition: A | Discharge status: Home / Self Care | Payer: Medicare HMO | Source: Ambulatory Visit | Attending: Family Medicine | Admitting: Family Medicine

## 2024-03-18 DIAGNOSIS — R921 Mammographic calcification found on diagnostic imaging of breast: Secondary | ICD-10-CM | POA: Diagnosis not present

## 2024-03-28 DIAGNOSIS — H401112 Primary open-angle glaucoma, right eye, moderate stage: Secondary | ICD-10-CM | POA: Diagnosis not present

## 2024-03-28 DIAGNOSIS — H04123 Dry eye syndrome of bilateral lacrimal glands: Secondary | ICD-10-CM | POA: Diagnosis not present

## 2024-03-28 DIAGNOSIS — H401121 Primary open-angle glaucoma, left eye, mild stage: Secondary | ICD-10-CM | POA: Diagnosis not present

## 2024-04-01 ENCOUNTER — Telehealth (HOSPITAL_COMMUNITY): Payer: Self-pay | Admitting: Cardiology

## 2024-04-18 ENCOUNTER — Other Ambulatory Visit: Payer: Self-pay | Admitting: Cardiovascular Disease

## 2024-04-23 ENCOUNTER — Ambulatory Visit (HOSPITAL_COMMUNITY)
Admission: RE | Admit: 2024-04-23 | Discharge: 2024-04-23 | Disposition: A | Discharge status: Home / Self Care | Source: Ambulatory Visit | Attending: Cardiology | Admitting: Cardiology

## 2024-04-23 VITALS — BP 136/84 | HR 71 | Wt 131.2 lb

## 2024-04-23 DIAGNOSIS — Z955 Presence of coronary angioplasty implant and graft: Secondary | ICD-10-CM | POA: Insufficient documentation

## 2024-04-23 DIAGNOSIS — I10 Essential (primary) hypertension: Secondary | ICD-10-CM | POA: Diagnosis not present

## 2024-04-23 DIAGNOSIS — Z8673 Personal history of transient ischemic attack (TIA), and cerebral infarction without residual deficits: Secondary | ICD-10-CM | POA: Insufficient documentation

## 2024-04-23 DIAGNOSIS — I5032 Chronic diastolic (congestive) heart failure: Secondary | ICD-10-CM | POA: Diagnosis not present

## 2024-04-23 DIAGNOSIS — Z7982 Long term (current) use of aspirin: Secondary | ICD-10-CM | POA: Insufficient documentation

## 2024-04-23 DIAGNOSIS — I480 Paroxysmal atrial fibrillation: Secondary | ICD-10-CM | POA: Insufficient documentation

## 2024-04-23 DIAGNOSIS — E785 Hyperlipidemia, unspecified: Secondary | ICD-10-CM | POA: Diagnosis not present

## 2024-04-23 DIAGNOSIS — R0683 Snoring: Secondary | ICD-10-CM | POA: Insufficient documentation

## 2024-04-23 DIAGNOSIS — Z7902 Long term (current) use of antithrombotics/antiplatelets: Secondary | ICD-10-CM | POA: Insufficient documentation

## 2024-04-23 DIAGNOSIS — Z79899 Other long term (current) drug therapy: Secondary | ICD-10-CM | POA: Insufficient documentation

## 2024-04-23 DIAGNOSIS — I110 Hypertensive heart disease with heart failure: Secondary | ICD-10-CM | POA: Diagnosis not present

## 2024-04-23 LAB — LIPID PANEL
Cholesterol: 181 mg/dL (ref 0–200)
HDL: 66 mg/dL (ref >40)
LDL Cholesterol: 92 mg/dL (ref 0–99)
Total CHOL/HDL Ratio: 2.7 ratio
Triglycerides: 114 mg/dL (ref <150)
VLDL: 23 mg/dL (ref 0–40)

## 2024-04-23 LAB — BASIC METABOLIC PANEL WITH GFR
Anion gap: 8 (ref 5–15)
BUN: 17 mg/dL (ref 8–23)
CO2: 25 mmol/L (ref 22–32)
Calcium: 9.9 mg/dL (ref 8.9–10.3)
Chloride: 107 mmol/L (ref 98–111)
Creatinine, Ser: 0.94 mg/dL (ref 0.44–1.00)
GFR, Estimated: >60 mL/min (ref >60)
Glucose, Bld: 103 mg/dL — ABNORMAL HIGH (ref 70–99)
Potassium: 4 mmol/L (ref 3.5–5.1)
Sodium: 140 mmol/L (ref 135–145)

## 2024-04-23 NOTE — Progress Notes (Signed)
 ADVANCED HF CLINIC CLINIC NOTE  PCP: Tonna Frederic, MD HF Cardiologist: Dr. Bruce Caper  CC: [id number]  HPI: Ms Brooke Gray is a 78 y/o female w/ h/o poorly controlled HTN, chronic diastolic dysfunction w/ severe LVH by echocardiogram, TIA, PAF, bradycardia, HLD, remote tobacco use and ascending aortic aneurysm.    Admitted (1/21) w/ TIA, BP noted to be markedly elevated. Echo showed normal EF 60-65% + severe LVH w/ mild speckled appearance, GIDD and normal RV. Wore heart monitor after discharge which detected afib. Was referred to Afib clinic and recommended to start DOAC. Started Eliquis . She had intolerance to high dose AVN blockers given bradycardia w/ occasional HRs in the 30s-40s. Unfortunately lost to f/u and not been seen by [facility] since 12/2019. Per Care Everywhere, was seen at [facility] 6/21 but no f/u since. Says she is now followed by [facility]. She has been on antihypertensives and diuretic therapy w/ torsemide.    Seen in the [facility] ED on 12/03/22 for acute dyspnea and respiratory distress, required BiPAP. Chest CT negative for PE. CXR showed pulmonary edema. BNP elevated 407. HS trop mildly elevated w/ flat trend, 237>>235. BP was elevated but not markedly high, 159/93. She was given dose of IV Lasix  w/ good UOP and symptomatic response. Able to wean off BiPAP back to room air. Monitored in ED x 3 hrs post treatment w/ stable O2 sats and was discharged home.   Admitted 4 days later 12/07/22 with recurrent respiratory distress. Required BiPAP. BP elevated 197/113. Concern for recurrent flash pulmonary edema. Started on NTG gtt and given 80 mg IV Lasix . HsTroponin elevated. No chest pain or obvious ACS, elevation felt to be secondary to demain ischemia and hypertensive urgency. AHF consulted to assist with HF management. Echo showed  EF 60-65%, severe LVH, RV normal, trivial MR, mild TR. cMRI showed variant of hypertrophic cardiomyopathy LVEF 54% RV 55%.  2 week zio patch placed with remote history of AFib. GDMT initially limited by AKI. GDMT titrated and she was discharged home, weight 125 lbs.  Interval hx:  Today she returns for HF follow up.Overall feeling fine. Remains active with no limitations. Denies SOB/PND/Orthopnea. Appetite ok. No fever or chills. . Taking all medications. Many question about croeg and entresto . BP at home 140/100.  She stopped Spiro stopped due to visual disturbance and hair loss.    Past Medical History:  Diagnosis Date   A-fib (HCC)    Cardiomegaly    Glaucoma    Hypertension    Current Outpatient Medications  Medication Sig Dispense Refill   acetaminophen  (TYLENOL ) 500 MG tablet Take 500 mg by mouth every 6 (six) hours as needed for mild pain (pain score 1-3), headache or fever.     Ascorbic Acid (VITAMIN C) 1000 MG tablet Take 1,000 mg by mouth daily.     aspirin  EC 81 MG tablet Take 1 tablet (81 mg total) by mouth daily. Swallow whole. 90 tablet 3   carvedilol  (COREG ) 3.125 MG tablet Take 3.125 mg by mouth 2 (two) times daily with a meal.     Cholecalciferol (VITAMIN D3) 125 MCG (5000 UT) TABS Take 5,000 mcg by mouth daily.     empagliflozin  (JARDIANCE ) 10 MG TABS tablet TAKE 1 TABLET BY MOUTH ONCE DAILY BEFORE BREAKFAST 90 tablet 1   ezetimibe  (ZETIA ) 10 MG tablet Take 1 tablet (10 mg total) by mouth daily. 90 tablet 3   ferrous sulfate  325 (65 FE) MG EC tablet Take 325 mg  by mouth daily as needed (cold hands).     fluticasone  (FLONASE ) 50 MCG/ACT nasal spray Place 2 sprays into both nostrils daily as needed for allergies or rhinitis.     latanoprost  (XALATAN ) 0.005 % ophthalmic solution Place 1 drop into both eyes at bedtime.      sacubitril -valsartan  (ENTRESTO ) 24-26 MG Take 1 tablet by mouth twice daily 180 tablet 2   No current facility-administered medications for this encounter.     BP 136/84   Pulse 71   Wt 59.5 kg (131 lb 3.2 oz)   SpO2 98%   BMI 23.24 kg/m   Wt Readings from Last 3  Encounters:  04/23/24 59.5 kg (131 lb 3.2 oz)  01/16/24 59.1 kg (130 lb 6.4 oz)  07/31/23 58.2 kg (128 lb 6.4 oz)   PHYSICAL EXAM: Vitals:   04/23/24 1525  BP: 136/84  Pulse: 71  SpO2: 98%   General:   No resp difficulty Neck: supple. no JVD.  Cor: PMI nondisplaced. Regular rate & rhythm. No rubs, gallops or murmurs. Lungs: clear Abdomen: soft, nontender, nondistended.  Extremities: no cyanosis, clubbing, rash, edema Neuro: alert & oriented x3   Cardiac Studies - cMRI (1/24): LVEF 54%, severe asymmetric septal hypertrophy but no LVOT or mitral valve SAM, RVEF 55%, extensive non-coronary pattern mid-wall LGE, findings suggestive of variant of HCM, cannot fully rule out cardiac amyloidosis, but ECV < 40% less suggestive.  - Echo (1/24): EF 60-65%, severe LVH, RV normal, trivial MR, mild TR  - Heart Monitor (2/21)>>mostly SR at 55 bpm, episodes of afib 2/22 and 2/23 with v rates of 160-180 bpm. 2 runs of VT of 4-5 beats.    - Echo (1/21): EF 60-65%, severe LVH w/ mild speckled appearance, grade I DD, normal RV  ASSESSMENT & PLAN: 1. Chronic Diastolic Heart Failure - Echo (2021): EF 60-65%, severe LVH w/ mild speckled appearance - Echo (1/24): EF 60-65%. Severe LVH. RV normal, trivial MR, mild TR - suspect hypertensive CM, though cannot r/o infiltrative CM (prior echo w/ speckled myocardium).  - cMRI (1/24): LVEF 54%, severe asymmetric septal hypertrophy but no LVOT or mitral valve SAM, RVEF 55%, extensive non-coronary pattern mid-wall LGE, findings suggestive of variant of HCM, cannot fully rule out cardiac amyloidosis, but ECV < 40% less suggestive. - Send [facility] gene testing to assess for HCM and TTR, and arrange PYP scan. - NYHA I. Appear euvolemic.  -Continue coreg  3.125 mg twice a day  - Continue Entresto  24/26mg  BID - Continue Jardiance  10 mg daily. - Intolerant spiro due to visual disturbance and hair loss. I updated allergy list.  - Continue Lasix  as needed.  - Check  Echo. Check BMEt    2. Hypertension -Stable - Continue coreg  3.125 mg twice a day  -Continue entresto  24-26 mg twice a day.  Check BMET   3. RAS -Status post drug-eluting stent by Dr. Alvenia Aus of the right renal artery. -Continue Plavix  and aspirin  - Followed yearly.    4. PAF - h/o TIA in 2001 w/ afib detection on Holter monitor.   5. Snoring -Sleep study pending  6. Hyperlipidemia - Continue zetia   10 mg daily  -Check lipid panel. .  Follow up in 6-8 weeks with ECHO. Check BMET.    Darwin Rothlisberger NP-C  3:41 PM

## 2024-04-23 NOTE — Patient Instructions (Signed)
 Medication Changes:  No Changes In Medications at this time. ,  Lab Work:  Labs done today, your results will be available in MyChart, we will contact you for abnormal readings.  ECHOCARDIOGRAM AS SCHEDULED   Follow-Up in: AROUND 6 WEEKS AS SCHEDULED WITH DR. Bruce Caper   At the [facility], you and your health needs are our priority. We have a designated team specialized in the treatment of Heart Failure. This Care Team includes your primary Heart Failure Specialized Cardiologist (physician), Advanced Practice Providers (APPs- Physician Assistants and Nurse Practitioners), and Pharmacist who all work together to provide you with the care you need, when you need it.   You may see any of the following providers on your designated Care Team at your next follow up:  Dr. Jules Oar Dr. Peder Bourdon Dr. Alwin Baars Dr. Judyth Nunnery Nieves Bars, NP Ruddy Corral, [place] [facility] [place], [place] Dennise Fitz, NP [place] Lee, NP Luster Salters, PharmD   Please be sure to bring in all your medications bottles to every appointment.   Need to Contact Us :  If you have any questions or concerns before your next appointment please send us  a message through [place] or call our office at [phone].    TO LEAVE A MESSAGE FOR THE NURSE SELECT OPTION 2, PLEASE LEAVE A MESSAGE INCLUDING: YOUR NAME DATE OF BIRTH CALL BACK NUMBER REASON FOR CALL**this is important as we prioritize the call backs  YOU WILL RECEIVE A CALL BACK THE SAME DAY AS LONG AS YOU CALL BEFORE 4:00 PM

## 2024-04-24 ENCOUNTER — Ambulatory Visit (HOSPITAL_COMMUNITY): Payer: Self-pay | Admitting: Adult Health

## 2024-04-25 DIAGNOSIS — I5030 Unspecified diastolic (congestive) heart failure: Secondary | ICD-10-CM | POA: Diagnosis not present

## 2024-04-25 DIAGNOSIS — I151 Hypertension secondary to other renal disorders: Secondary | ICD-10-CM | POA: Diagnosis not present

## 2024-04-25 DIAGNOSIS — E782 Mixed hyperlipidemia: Secondary | ICD-10-CM | POA: Diagnosis not present

## 2024-04-25 DIAGNOSIS — E559 Vitamin D deficiency, unspecified: Secondary | ICD-10-CM | POA: Diagnosis not present

## 2024-04-25 DIAGNOSIS — Z6823 Body mass index (BMI) 23.0-23.9, adult: Secondary | ICD-10-CM | POA: Diagnosis not present

## 2024-04-25 DIAGNOSIS — G478 Other sleep disorders: Secondary | ICD-10-CM | POA: Diagnosis not present

## 2024-04-25 DIAGNOSIS — R7303 Prediabetes: Secondary | ICD-10-CM | POA: Diagnosis not present

## 2024-05-09 ENCOUNTER — Ambulatory Visit (HOSPITAL_COMMUNITY)
Admission: RE | Admit: 2024-05-09 | Discharge: 2024-05-09 | Disposition: A | Discharge status: Home / Self Care | Source: Ambulatory Visit | Attending: Cardiology | Admitting: Cardiology

## 2024-05-09 DIAGNOSIS — I5032 Chronic diastolic (congestive) heart failure: Secondary | ICD-10-CM | POA: Insufficient documentation

## 2024-05-09 DIAGNOSIS — I340 Nonrheumatic mitral (valve) insufficiency: Secondary | ICD-10-CM | POA: Insufficient documentation

## 2024-05-09 DIAGNOSIS — I517 Cardiomegaly: Secondary | ICD-10-CM | POA: Diagnosis not present

## 2024-05-09 LAB — ECHOCARDIOGRAM COMPLETE
Area-P 1/2: 3.02 cm2
S' Lateral: 2.6 cm

## 2024-05-13 DIAGNOSIS — N958 Other specified menopausal and perimenopausal disorders: Secondary | ICD-10-CM | POA: Diagnosis not present

## 2024-05-13 DIAGNOSIS — E2839 Other primary ovarian failure: Secondary | ICD-10-CM | POA: Diagnosis not present

## 2024-05-13 DIAGNOSIS — M8588 Other specified disorders of bone density and structure, other site: Secondary | ICD-10-CM | POA: Diagnosis not present

## 2024-05-20 DIAGNOSIS — E782 Mixed hyperlipidemia: Secondary | ICD-10-CM | POA: Diagnosis not present

## 2024-05-20 DIAGNOSIS — I151 Hypertension secondary to other renal disorders: Secondary | ICD-10-CM | POA: Diagnosis not present

## 2024-05-20 DIAGNOSIS — I5030 Unspecified diastolic (congestive) heart failure: Secondary | ICD-10-CM | POA: Diagnosis not present

## 2024-05-20 NOTE — Telephone Encounter (Addendum)
 Pt aware, agreeable, and verbalized understanding   ----- Message from [facility] sent at 05/20/2024  8:57 AM EDT ----- ECHO LVEF 70-75% . Moderate LVH. Normal RV. No change . Continue current regimen. Please call.  ----- Message ----- From: Interface, [employer] Sent: 05/09/2024   5:46 PM EDT To: Greig JONETTA Mosses, NP

## 2024-05-23 NOTE — Progress Notes (Addendum)
 ADVANCED HF CLINIC CLINIC NOTE  PCP: Berneta Elsie Sayre, MD HF Cardiologist: Dr. Gardenia  CC: [id number]  HPI: Brooke Gray is a 78 y/o female w/ h/o poorly controlled HTN, chronic diastolic dysfunction w/ severe LVH by echocardiogram, TIA, PAF, bradycardia, HLD, remote tobacco use and ascending aortic aneurysm.    Admitted (1/21) w/ TIA, BP noted to be markedly elevated. Echo showed normal EF 60-65% + severe LVH w/ mild speckled appearance, GIDD and normal RV. Wore heart monitor after discharge which detected afib. Was referred to Afib clinic and recommended to start DOAC. Started Eliquis . She had intolerance to high dose AVN blockers given bradycardia w/ occasional HRs in the 30s-40s. Unfortunately lost to f/u and not been seen by [facility] since 12/2019. Per Care Everywhere, was seen at [facility] 6/21 but no f/u since. Says she is now followed by [facility]. She has been on antihypertensives and diuretic therapy w/ torsemide.    Seen in the [facility] ED on 12/03/22 for acute dyspnea and respiratory distress, required BiPAP. Chest CT negative for PE. CXR showed pulmonary edema. BNP elevated 407. HS trop mildly elevated w/ flat trend, 237>>235. BP was elevated but not markedly high, 159/93. She was given dose of IV Lasix  w/ good UOP and symptomatic response. Able to wean off BiPAP back to room air. Monitored in ED x 3 hrs post treatment w/ stable O2 sats and was discharged home.   Admitted 4 days later 12/07/22 with recurrent respiratory distress. Required BiPAP. BP elevated 197/113. Concern for recurrent flash pulmonary edema. Started on NTG gtt and given 80 mg IV Lasix . HsTroponin elevated. No chest pain or obvious ACS, elevation felt to be secondary to demain ischemia and hypertensive urgency. AHF consulted to assist with HF management. Echo showed  EF 60-65%, severe LVH, RV normal, trivial MR, mild TR. cMRI showed variant of hypertrophic cardiomyopathy LVEF 54% RV 55%.  2 week zio patch placed with remote history of AFib. GDMT initially limited by AKI. GDMT titrated and she was discharged home, weight 125 lbs.  Interval hx:  - Presents today for follow up. Overall she has been feeling fairly well. She has switched PCPs now and feels better after starting coq10 and omega 3 oils.  - Asymptomatic from HF standpoint   Past Medical History:  Diagnosis Date   A-fib (HCC)    Cardiomegaly    Glaucoma    Hypertension    Current Outpatient Medications  Medication Sig Dispense Refill   acetaminophen  (TYLENOL ) 500 MG tablet Take 500 mg by mouth every 6 (six) hours as needed for mild pain (pain score 1-3), headache or fever.     Ascorbic Acid (VITAMIN C) 1000 MG tablet Take 1,000 mg by mouth daily.     aspirin  EC 81 MG tablet Take 1 tablet (81 mg total) by mouth daily. Swallow whole. 90 tablet 3   carvedilol  (COREG ) 3.125 MG tablet Take 3.125 mg by mouth 2 (two) times daily with a meal.     Cholecalciferol (VITAMIN D3) 125 MCG (5000 UT) TABS Take 5,000 mcg by mouth daily.     empagliflozin  (JARDIANCE ) 10 MG TABS tablet TAKE 1 TABLET BY MOUTH ONCE DAILY BEFORE BREAKFAST 90 tablet 1   ezetimibe  (ZETIA ) 10 MG tablet Take 1 tablet (10 mg total) by mouth daily. 90 tablet 3   ferrous sulfate  325 (65 FE) MG EC tablet Take 325 mg by mouth daily as needed (cold hands).     fluticasone  (FLONASE ) 50 MCG/ACT  nasal spray Place 2 sprays into both nostrils daily as needed for allergies or rhinitis.     latanoprost  (XALATAN ) 0.005 % ophthalmic solution Place 1 drop into both eyes at bedtime.      sacubitril -valsartan  (ENTRESTO ) 24-26 MG Take 1 tablet by mouth twice daily 180 tablet 2   No current facility-administered medications for this encounter.     BP 120/70 (BP Location: Left Arm)   Pulse (!) 56   Wt 58.6 kg (129 lb 3.2 oz)   SpO2 99%   BMI 22.89 kg/m   Wt Readings from Last 3 Encounters:  05/24/24 58.6 kg (129 lb 3.2 oz)  04/23/24 59.5 kg (131 lb 3.2 oz)   01/16/24 59.1 kg (130 lb 6.4 oz)   PHYSICAL EXAM: Vitals:   05/24/24 1509  BP: 120/70  Pulse: (!) 56  SpO2: 99%   GENERAL: NAD Lungs- CTA CARDIAC:  JVP: 6 cm          Normal rate with regular rhythm. no murmur.  Pulses 2+. no edema.  ABDOMEN: Soft, non-tender, non-distended.  EXTREMITIES: Warm and well perfused.  NEUROLOGIC: No obvious FND    Cardiac Studies - cMRI (1/24): LVEF 54%, severe asymmetric septal hypertrophy but no LVOT or mitral valve SAM, RVEF 55%, extensive non-coronary pattern mid-wall LGE, findings suggestive of variant of HCM, cannot fully rule out cardiac amyloidosis, but ECV < 40% less suggestive.  - Echo (1/24): EF 60-65%, severe LVH, RV normal, trivial MR, mild TR  - Heart Monitor (2/21)>>mostly SR at 55 bpm, episodes of afib 2/22 and 2/23 with v rates of 160-180 bpm. 2 runs of VT of 4-5 beats.    - Echo (1/21): EF 60-65%, severe LVH w/ mild speckled appearance, grade I DD, normal RV  ASSESSMENT & PLAN: 1. Chronic Diastolic Heart Failure - Echo (2021): EF 60-65%, severe LVH w/ mild speckled appearance - Echo (1/24): EF 60-65%. Severe LVH. RV normal, trivial MR, mild TR - suspect hypertensive CM, though cannot r/o infiltrative CM (prior echo w/ speckled myocardium).  - cMRI (1/24): LVEF 54%, severe asymmetric septal hypertrophy but no LVOT or mitral valve SAM, RVEF 55%, extensive non-coronary pattern mid-wall LGE, findings suggestive of variant of HCM, cannot fully rule out cardiac amyloidosis, but ECV < 40% less suggestive. - Send [facility] gene testing to assess for HCM and TTR, and arrange PYP scan. - She wishes to be on less medications; NYHA I functional class; Will discontinue coreg  today.  - Continue Entresto  24/26mg  BID - Continue Jardiance  10 mg daily. - Intolerant spiro due to visual disturbance and hair loss. - Continue Lasix  as needed.    2. Hypertension -stable -Continue entresto  24/26mg  BID  3. RAS -Status post drug-eluting stent by Dr.  Darron of the right renal artery. -Continue Plavix  and aspirin  - Followed yearly.    4. PAF - h/o TIA in 2001 w/ afib detection on Holter monitor.   5. Snoring -Sleep study pending  6. Hyperlipidemia - Continue zetia   10 mg daily  -Check lipid panel. .  7. Pre-diabetes - A1C, BMP today.   Brees Hounshell DO 3:45 PM

## 2024-05-24 ENCOUNTER — Encounter (HOSPITAL_COMMUNITY): Payer: Self-pay | Admitting: Cardiology

## 2024-05-24 ENCOUNTER — Ambulatory Visit (HOSPITAL_COMMUNITY)
Admission: RE | Admit: 2024-05-24 | Discharge: 2024-05-24 | Disposition: A | Discharge status: Home / Self Care | Source: Ambulatory Visit | Attending: Cardiology | Admitting: Cardiology

## 2024-05-24 VITALS — BP 120/70 | HR 56 | Wt 129.2 lb

## 2024-05-24 DIAGNOSIS — R7303 Prediabetes: Secondary | ICD-10-CM | POA: Insufficient documentation

## 2024-05-24 DIAGNOSIS — Z7902 Long term (current) use of antithrombotics/antiplatelets: Secondary | ICD-10-CM | POA: Insufficient documentation

## 2024-05-24 DIAGNOSIS — Z7901 Long term (current) use of anticoagulants: Secondary | ICD-10-CM | POA: Diagnosis not present

## 2024-05-24 DIAGNOSIS — Z8673 Personal history of transient ischemic attack (TIA), and cerebral infarction without residual deficits: Secondary | ICD-10-CM | POA: Diagnosis not present

## 2024-05-24 DIAGNOSIS — Z79899 Other long term (current) drug therapy: Secondary | ICD-10-CM | POA: Diagnosis not present

## 2024-05-24 DIAGNOSIS — I480 Paroxysmal atrial fibrillation: Secondary | ICD-10-CM | POA: Insufficient documentation

## 2024-05-24 DIAGNOSIS — I110 Hypertensive heart disease with heart failure: Secondary | ICD-10-CM | POA: Diagnosis not present

## 2024-05-24 DIAGNOSIS — I5032 Chronic diastolic (congestive) heart failure: Secondary | ICD-10-CM | POA: Insufficient documentation

## 2024-05-24 DIAGNOSIS — I150 Renovascular hypertension: Secondary | ICD-10-CM | POA: Diagnosis not present

## 2024-05-24 DIAGNOSIS — Z955 Presence of coronary angioplasty implant and graft: Secondary | ICD-10-CM | POA: Insufficient documentation

## 2024-05-24 DIAGNOSIS — R0683 Snoring: Secondary | ICD-10-CM | POA: Insufficient documentation

## 2024-05-24 DIAGNOSIS — E785 Hyperlipidemia, unspecified: Secondary | ICD-10-CM | POA: Diagnosis not present

## 2024-05-24 DIAGNOSIS — Z7982 Long term (current) use of aspirin: Secondary | ICD-10-CM | POA: Insufficient documentation

## 2024-05-24 LAB — BASIC METABOLIC PANEL WITH GFR
Anion gap: 9 (ref 5–15)
BUN: 20 mg/dL (ref 8–23)
CO2: 25 mmol/L (ref 22–32)
Calcium: 9.8 mg/dL (ref 8.9–10.3)
Chloride: 105 mmol/L (ref 98–111)
Creatinine, Ser: 0.75 mg/dL (ref 0.44–1.00)
GFR, Estimated: >60 mL/min (ref >60)
Glucose, Bld: 79 mg/dL (ref 70–99)
Potassium: 4.3 mmol/L (ref 3.5–5.1)
Sodium: 139 mmol/L (ref 135–145)

## 2024-05-24 NOTE — Patient Instructions (Signed)
 STOP Carvedilol    Labs done today, your results will be available in MyChart, we will contact you for abnormal readings.  Your physician recommends that you schedule a follow-up appointment in: 6 months ( January 2026) ** PLEASE CALL THE OFFICE IN NOVEMBER TO ARRANGE YOUR FOLLOW UP APPOINTMENT.**  If you have any questions or concerns before your next appointment please send us  a message through [place] or call our office at [phone].    TO LEAVE A MESSAGE FOR THE NURSE SELECT OPTION 2, PLEASE LEAVE A MESSAGE INCLUDING: YOUR NAME DATE OF BIRTH CALL BACK NUMBER REASON FOR CALL**this is important as we prioritize the call backs  YOU WILL RECEIVE A CALL BACK THE SAME DAY AS LONG AS YOU CALL BEFORE 4:00 PM  At the [facility], you and your health needs are our priority. As part of our continuing mission to provide you with exceptional heart care, we have created designated Provider Care Teams. These Care Teams include your primary Cardiologist (physician) and Advanced Practice Providers (APPs- Physician Assistants and Nurse Practitioners) who all work together to provide you with the care you need, when you need it.   You may see any of the following providers on your designated Care Team at your next follow up: Dr Toribio Fuel Dr Ezra Shuck Dr. Ria Commander Dr. Morene Brownie Amy Lenetta, NP Caffie Shed, [place] [facility] [place], [place] Beckey Coe, NP [place] Lee, NP Ellouise Class, NP Tinnie Redman, PharmD Jaun Bash, PharmD   Please be sure to bring in all your medications bottles to every appointment.    Thank you for choosing [facility]

## 2024-05-24 NOTE — Addendum Note (Signed)
 Encounter addended by: Marcelina Lisa HERO, RN on: 05/24/2024 3:58 PM  Actions taken: Order list changed, Diagnosis association updated, Clinical Note Signed, Charge Capture section accepted

## 2024-05-25 LAB — HEMOGLOBIN A1C
Hgb A1c MFr Bld: 5.6 % (ref 4.8–5.6)
Mean Plasma Glucose: 114 mg/dL

## 2024-05-31 DIAGNOSIS — E782 Mixed hyperlipidemia: Secondary | ICD-10-CM | POA: Diagnosis not present

## 2024-05-31 DIAGNOSIS — I151 Hypertension secondary to other renal disorders: Secondary | ICD-10-CM | POA: Diagnosis not present

## 2024-05-31 DIAGNOSIS — G478 Other sleep disorders: Secondary | ICD-10-CM | POA: Diagnosis not present

## 2024-05-31 DIAGNOSIS — R7303 Prediabetes: Secondary | ICD-10-CM | POA: Diagnosis not present

## 2024-05-31 DIAGNOSIS — I5030 Unspecified diastolic (congestive) heart failure: Secondary | ICD-10-CM | POA: Diagnosis not present

## 2024-06-03 ENCOUNTER — Other Ambulatory Visit (HOSPITAL_COMMUNITY): Payer: Self-pay | Admitting: Cardiology

## 2024-06-14 ENCOUNTER — Telehealth: Payer: Self-pay

## 2024-06-14 NOTE — Progress Notes (Signed)
   06/14/2024  Patient ID: Brooke Gray, female   DOB: June 13, 1946, 78 y.o.   MRN: [id number]  This patient is appearing on a report for being at risk of failing the adherence measure for diabetes medications this calendar year.   Medication: Jardiance  10mg  Last fill date: 06/01/2024 for 90 day supply  Insurance report was not up to date. No action needed at this time.   Channing DELENA Mealing, PharmD, DPLA

## 2024-07-01 ENCOUNTER — Other Ambulatory Visit: Payer: Self-pay | Admitting: *Deleted

## 2024-07-01 DIAGNOSIS — I701 Atherosclerosis of renal artery: Secondary | ICD-10-CM

## 2024-07-29 DIAGNOSIS — H2513 Age-related nuclear cataract, bilateral: Secondary | ICD-10-CM | POA: Diagnosis not present

## 2024-07-29 DIAGNOSIS — H401112 Primary open-angle glaucoma, right eye, moderate stage: Secondary | ICD-10-CM | POA: Diagnosis not present

## 2024-07-29 DIAGNOSIS — H401121 Primary open-angle glaucoma, left eye, mild stage: Secondary | ICD-10-CM | POA: Diagnosis not present

## 2024-07-29 DIAGNOSIS — H04123 Dry eye syndrome of bilateral lacrimal glands: Secondary | ICD-10-CM | POA: Diagnosis not present

## 2024-08-07 DIAGNOSIS — L851 Acquired keratosis [keratoderma] palmaris et plantaris: Secondary | ICD-10-CM | POA: Diagnosis not present

## 2024-08-07 DIAGNOSIS — L84 Corns and callosities: Secondary | ICD-10-CM | POA: Diagnosis not present

## 2024-08-07 DIAGNOSIS — M21622 Bunionette of left foot: Secondary | ICD-10-CM | POA: Diagnosis not present

## 2024-08-08 ENCOUNTER — Encounter (HOSPITAL_COMMUNITY)

## 2024-08-19 ENCOUNTER — Encounter (HOSPITAL_COMMUNITY)

## 2024-09-02 ENCOUNTER — Ambulatory Visit (HOSPITAL_COMMUNITY)
Admission: RE | Admit: 2024-09-02 | Discharge: 2024-09-02 | Disposition: A | Discharge status: Home / Self Care | Source: Ambulatory Visit | Attending: Cardiovascular Disease | Admitting: Cardiovascular Disease

## 2024-09-02 DIAGNOSIS — I701 Atherosclerosis of renal artery: Secondary | ICD-10-CM | POA: Insufficient documentation

## 2024-09-04 ENCOUNTER — Ambulatory Visit: Payer: Self-pay | Admitting: Cardiovascular Disease

## 2024-09-04 DIAGNOSIS — I701 Atherosclerosis of renal artery: Secondary | ICD-10-CM

## 2024-09-06 ENCOUNTER — Other Ambulatory Visit (HOSPITAL_COMMUNITY): Payer: Self-pay | Admitting: Cardiology

## 2024-09-08 ENCOUNTER — Telehealth: Admitting: Nurse Practitioner

## 2024-09-08 DIAGNOSIS — J40 Bronchitis, not specified as acute or chronic: Secondary | ICD-10-CM

## 2024-09-08 MED ORDER — AZITHROMYCIN 250 MG PO TABS: ORAL_TABLET | ORAL | 0 refills | Status: AC

## 2024-09-08 NOTE — Progress Notes (Signed)
 Virtual Visit Consent   Brooke Gray, you are scheduled for a virtual visit with a [facility] provider today. Just as with appointments in the office, your consent must be obtained to participate. Your consent will be active for this visit and any virtual visit you may have with one of our providers in the next 365 days. If you have a MyChart account, a copy of this consent can be sent to you electronically.  As this is a virtual visit, video technology does not allow for your provider to perform a traditional examination. This may limit your provider's ability to fully assess your condition. If your provider identifies any concerns that need to be evaluated in person or the need to arrange testing (such as labs, EKG, etc.), we will make arrangements to do so. Although advances in technology are sophisticated, we cannot ensure that it will always work on either your end or our end. If the connection with a video visit is poor, the visit may have to be switched to a telephone visit. With either a video or telephone visit, we are not always able to ensure that we have a secure connection.  By engaging in this virtual visit, you consent to the provision of healthcare and authorize for your insurance to be billed (if applicable) for the services provided during this visit. Depending on your insurance coverage, you may receive a charge related to this service.  I need to obtain your verbal consent now. Are you willing to proceed with your visit today? Pina Sirianni has provided verbal consent on 09/08/2024 for a virtual visit (video or telephone). Haze LELON Servant, NP  Date: 09/08/2024 9:52 AM   Virtual Visit via Video Note   I, Haze LELON Servant, connected with  Brooke Gray  ([id number], April 26, 1946) on 09/08/24 at  9:15 AM EDT by a video-enabled telemedicine application and verified that I am speaking with the correct person using two identifiers.  Location: Patient: Virtual Visit Location  Patient: Home Provider: Virtual Visit Location Provider: Home Office   I discussed the limitations of evaluation and management by telemedicine and the availability of in person appointments. The patient expressed understanding and agreed to proceed.    History of Present Illness: Brooke Gray is a 78 y.o. who identifies as a female who was assigned female at birth, and is being seen today for persistent cough.  Brooke Gray has been experiencing persistent cough over the past 2 weeks. Initially she had a runny nose, sneezing, chest congestion and chills. At this time the other symptoms have resolved however the cough continues to be persistent with congestion and chest tightness  She is a former smoker. Taking robitussin with little improvement  Problems:  Patient Active Problem List   Diagnosis Date Noted   Prediabetes 04/27/2023   Hiccups 03/13/2023   Hypotension due to drugs 03/13/2023   Elevated glucose 01/24/2023   Low TSH level 01/24/2023   Normocytic anemia 01/24/2023   Sepsis due to pneumonia (HCC) 12/08/2022   Septic shock (HCC) 12/08/2022   Heart failure (HCC) 12/08/2022   Acute respiratory failure with hypoxia (HCC) 12/07/2022   Paroxysmal atrial fibrillation (HCC) 12/07/2022   Thrombocytopenia 12/07/2022   Transaminitis 12/07/2022   Hypertensive urgency 12/01/2019   Elevated troponin 12/01/2019   Hypertensive crisis 11/30/2019   Ascending aortic aneurysm 11/24/2019   LVH (left ventricular hypertrophy) due to hypertensive disease, without heart failure 11/24/2019   Acute on chronic diastolic CHF (congestive heart failure) (HCC) 11/24/2019   Hyperlipemia 11/23/2019  TIA (transient ischemic attack) 11/22/2019   Globus sensation 04/10/2019   Nonallergic rhinitis 04/10/2019   History of tobacco use 04/10/2019    Allergies:  Allergies  Allergen Reactions   Spironolactone  Other (See Comments)    Visual disturbance.    Penicillins Hives   Medications:  Current  Outpatient Medications:    azithromycin (ZITHROMAX) 250 MG tablet, Take 2 tablets on day 1, then 1 tablet daily on days 2 through 5, Disp: 6 tablet, Rfl: 0   acetaminophen  (TYLENOL ) 500 MG tablet, Take 500 mg by mouth every 6 (six) hours as needed for mild pain (pain score 1-3), headache or fever., Disp: , Rfl:    Ascorbic Acid (VITAMIN C) 1000 MG tablet, Take 1,000 mg by mouth daily., Disp: , Rfl:    aspirin  EC 81 MG tablet, Take 1 tablet (81 mg total) by mouth daily. Swallow whole., Disp: 90 tablet, Rfl: 3   Cholecalciferol (VITAMIN D3) 125 MCG (5000 UT) TABS, Take 5,000 mcg by mouth daily., Disp: , Rfl:    empagliflozin  (JARDIANCE ) 10 MG TABS tablet, TAKE 1 TABLET BY MOUTH ONCE DAILY BEFORE BREAKFAST, Disp: 90 tablet, Rfl: 1   ezetimibe  (ZETIA ) 10 MG tablet, Take 1 tablet (10 mg total) by mouth daily., Disp: 90 tablet, Rfl: 3   ferrous sulfate  325 (65 FE) MG EC tablet, Take 325 mg by mouth daily as needed (cold hands)., Disp: , Rfl:    fluticasone  (FLONASE ) 50 MCG/ACT nasal spray, Place 2 sprays into both nostrils daily as needed for allergies or rhinitis., Disp: , Rfl:    latanoprost  (XALATAN ) 0.005 % ophthalmic solution, Place 1 drop into both eyes at bedtime. , Disp: , Rfl:    sacubitril -valsartan  (ENTRESTO ) 24-26 MG, Take 1 tablet by mouth twice daily, Disp: 180 tablet, Rfl: 2  Observations/Objective: Patient is well-developed, well-nourished in no acute distress.  Resting comfortably at home.  Head is normocephalic, atraumatic.  No labored breathing.  Speech is clear and coherent with logical content.  Patient is alert and oriented at baseline.    Assessment and Plan: 1. Bronchitis (Primary) - azithromycin (ZITHROMAX) 250 MG tablet; Take 2 tablets on day 1, then 1 tablet daily on days 2 through 5  Dispense: 6 tablet; Refill: 0 Continue tussin. If blood pressure increases will need to switch to coricidin  Follow Up Instructions: I discussed the assessment and treatment plan with  the patient. The patient was provided an opportunity to ask questions and all were answered. The patient agreed with the plan and demonstrated an understanding of the instructions.  A copy of instructions were sent to the patient via MyChart unless otherwise noted below.     The patient was advised to call back or seek an in-person evaluation if the symptoms worsen or if the condition fails to improve as anticipated.    Brooke Chambless W Jaeleigh Monaco, NP

## 2024-09-08 NOTE — Patient Instructions (Addendum)
 Arland Clap, thank you for joining Haze LELON Servant, NP for today's virtual visit.  While this provider is not your primary care provider (PCP), if your PCP is located in our provider database this encounter information will be shared with them immediately following your visit.   A [facility] MyChart account gives you access to today's visit and all your visits, tests, and labs performed at [facility]  click here if you don't have a [facility] MyChart account or go to [email or web address]  Consent: (Patient) Brooke Gray provided verbal consent for this virtual visit at the beginning of the encounter.  Current Medications:  Current Outpatient Medications:    azithromycin (ZITHROMAX) 250 MG tablet, Take 2 tablets on day 1, then 1 tablet daily on days 2 through 5, Disp: 6 tablet, Rfl: 0   acetaminophen  (TYLENOL ) 500 MG tablet, Take 500 mg by mouth every 6 (six) hours as needed for mild pain (pain score 1-3), headache or fever., Disp: , Rfl:    Ascorbic Acid (VITAMIN C) 1000 MG tablet, Take 1,000 mg by mouth daily., Disp: , Rfl:    aspirin  EC 81 MG tablet, Take 1 tablet (81 mg total) by mouth daily. Swallow whole., Disp: 90 tablet, Rfl: 3   Cholecalciferol (VITAMIN D3) 125 MCG (5000 UT) TABS, Take 5,000 mcg by mouth daily., Disp: , Rfl:    empagliflozin  (JARDIANCE ) 10 MG TABS tablet, TAKE 1 TABLET BY MOUTH ONCE DAILY BEFORE BREAKFAST, Disp: 90 tablet, Rfl: 1   ezetimibe  (ZETIA ) 10 MG tablet, Take 1 tablet (10 mg total) by mouth daily., Disp: 90 tablet, Rfl: 3   ferrous sulfate  325 (65 FE) MG EC tablet, Take 325 mg by mouth daily as needed (cold hands)., Disp: , Rfl:    fluticasone  (FLONASE ) 50 MCG/ACT nasal spray, Place 2 sprays into both nostrils daily as needed for allergies or rhinitis., Disp: , Rfl:    latanoprost  (XALATAN ) 0.005 % ophthalmic solution, Place 1 drop into both eyes at bedtime. , Disp: , Rfl:    sacubitril -valsartan  (ENTRESTO ) 24-26 MG, Take 1 tablet by  mouth twice daily, Disp: 180 tablet, Rfl: 2   Medications ordered in this encounter:  Meds ordered this encounter  Medications   azithromycin (ZITHROMAX) 250 MG tablet    Sig: Take 2 tablets on day 1, then 1 tablet daily on days 2 through 5    Dispense:  6 tablet    Refill:  0    Supervising Provider:   LAMPTEY, PHILIP O [phone]     *If you need refills on other medications prior to your next appointment, please contact your pharmacy*  Follow-Up: Call back or seek an in-person evaluation if the symptoms worsen or if the condition fails to improve as anticipated.  [facility] [phone]  Other Instructions Continue tussin. If blood pressure increases will need to switch to coricidin  If you have been instructed to have an in-person evaluation today at a local Urgent Care facility, please use the link below. It will take you to a list of all of our available [facility], including address, phone number and hours of operation. Please do not delay care.  [facility]  If you or a family member do not have a primary care provider, use the link below to schedule a visit and establish care. When you choose a [facility] primary care physician or advanced practice provider, you gain a long-term partner in health. Find a Primary Care Provider  Learn more about [facility]'s in-office and virtual care options: [facility] - Get Care Now

## 2024-09-10 DIAGNOSIS — J22 Unspecified acute lower respiratory infection: Secondary | ICD-10-CM | POA: Diagnosis not present

## 2024-09-10 DIAGNOSIS — R062 Wheezing: Secondary | ICD-10-CM | POA: Diagnosis not present

## 2024-09-26 ENCOUNTER — Telehealth (HOSPITAL_COMMUNITY): Payer: Self-pay | Admitting: Cardiology

## 2024-09-26 NOTE — Telephone Encounter (Signed)
 Patient called to report yeast infection Feels its related to jardiance   Reports she was advised to try otc monistat by pcp. Requests an appointment to follow up on her heart and discuss alternatives to jardiance   Reports she will hold jardiance  until follow up Add on made 11/21 @ 330

## 2024-10-08 ENCOUNTER — Ambulatory Visit (HOSPITAL_COMMUNITY)

## 2024-10-08 NOTE — Progress Notes (Incomplete)
 ADVANCED HF CLINIC CLINIC NOTE  PCP: Brooke Elsie Sayre, MD HF Cardiologist: Dr. Gardenia  CC: [id number]  HPI: Brooke [facility] is a 78 y/o female w/ h/o poorly controlled HTN, chronic diastolic dysfunction w/ severe LVH by echocardiogram, TIA, PAF, bradycardia, HLD, remote tobacco use and ascending aortic aneurysm.    Admitted (1/21) w/ TIA, BP noted to be markedly elevated. Echo showed normal EF 60-65% + severe LVH w/ mild speckled appearance, GIDD and normal RV. Wore heart monitor after discharge which detected afib. Was referred to Afib clinic and recommended to start DOAC. Started Eliquis . She had intolerance to high dose AVN blockers given bradycardia w/ occasional HRs in the 30s-40s. Unfortunately lost to f/u and not been seen by [facility] since 12/2019. Per Care Everywhere, was seen at [facility] 6/21 but no f/u since. Says she is now followed by [facility]. She has been on antihypertensives and diuretic therapy w/ torsemide.    Seen in the [facility] on 12/03/22 for acute dyspnea and respiratory distress, required BiPAP. Chest CT negative for PE. CXR showed pulmonary edema. BNP elevated 407. HS trop mildly elevated w/ flat trend, 237>>235. BP was elevated but not markedly high, 159/93. She was given dose of IV Lasix  w/ good UOP and symptomatic response. Able to wean off BiPAP back to room air. Monitored in ED x 3 hrs post treatment w/ stable O2 sats and was discharged home.   Admitted 4 days later 12/07/22 with recurrent respiratory distress. Required BiPAP. BP elevated 197/113. Concern for recurrent flash pulmonary edema. Started on NTG gtt and given 80 mg IV Lasix . HsTroponin elevated. No chest pain or obvious ACS, elevation felt to be secondary to demain ischemia and hypertensive urgency. AHF consulted to assist with HF management. Echo showed  EF 60-65%, severe LVH, RV normal, trivial MR, mild TR. cMRI showed variant of hypertrophic cardiomyopathy LVEF 54% RV 55%.  2 week zio patch placed with remote history of AFib. GDMT initially limited by AKI. GDMT titrated and she was discharged home, weight 125 lbs.  Interval hx:  - Presents today for follow up. Overall she has been feeling fairly well. She has switched PCPs now and feels better after starting coq10 and omega 3 oils.  - Asymptomatic from HF standpoint   Past Medical History:  Diagnosis Date   A-fib (HCC)    Cardiomegaly    Glaucoma    Hypertension    Current Outpatient Medications  Medication Sig Dispense Refill   acetaminophen  (TYLENOL ) 500 MG tablet Take 500 mg by mouth every 6 (six) hours as needed for mild pain (pain score 1-3), headache or fever.     Ascorbic Acid (VITAMIN C) 1000 MG tablet Take 1,000 mg by mouth daily.     aspirin  EC 81 MG tablet Take 1 tablet (81 mg total) by mouth daily. Swallow whole. 90 tablet 3   Cholecalciferol (VITAMIN D3) 125 MCG (5000 UT) TABS Take 5,000 mcg by mouth daily.     empagliflozin  (JARDIANCE ) 10 MG TABS tablet TAKE 1 TABLET BY MOUTH ONCE DAILY BEFORE BREAKFAST 90 tablet 1   ezetimibe  (ZETIA ) 10 MG tablet Take 1 tablet (10 mg total) by mouth daily. 90 tablet 3   ferrous sulfate  325 (65 FE) MG EC tablet Take 325 mg by mouth daily as needed (cold hands).     fluticasone  (FLONASE ) 50 MCG/ACT nasal spray Place 2 sprays into both nostrils daily as needed for allergies or rhinitis.     latanoprost  (XALATAN )  0.005 % ophthalmic solution Place 1 drop into both eyes at bedtime.      sacubitril -valsartan  (ENTRESTO ) 24-26 MG Take 1 tablet by mouth twice daily 180 tablet 2   No current facility-administered medications for this visit.     There were no vitals taken for this visit.  Wt Readings from Last 3 Encounters:  05/24/24 58.6 kg (129 lb 3.2 oz)  04/23/24 59.5 kg (131 lb 3.2 oz)  01/16/24 59.1 kg (130 lb 6.4 oz)   PHYSICAL EXAM: There were no vitals filed for this visit.  GENERAL: NAD Lungs- CTA CARDIAC:  JVP: 6 cm          Normal rate with  regular rhythm. no murmur.  Pulses 2+. no edema.  ABDOMEN: Soft, non-tender, non-distended.  EXTREMITIES: Warm and well perfused.  NEUROLOGIC: No obvious FND    Cardiac Studies - cMRI (1/24): LVEF 54%, severe asymmetric septal hypertrophy but no LVOT or mitral valve SAM, RVEF 55%, extensive non-coronary pattern mid-wall LGE, findings suggestive of variant of HCM, cannot fully rule out cardiac amyloidosis, but ECV < 40% less suggestive.  - Echo (1/24): EF 60-65%, severe LVH, RV normal, trivial MR, mild TR  - Heart Monitor (2/21)>>mostly SR at 55 bpm, episodes of afib 2/22 and 2/23 with v rates of 160-180 bpm. 2 runs of VT of 4-5 beats.    - Echo (1/21): EF 60-65%, severe LVH w/ mild speckled appearance, grade I DD, normal RV  ASSESSMENT & PLAN: 1. Chronic Diastolic Heart Failure - Echo (2021): EF 60-65%, severe LVH w/ mild speckled appearance - Echo (1/24): EF 60-65%. Severe LVH. RV normal, trivial MR, mild TR - suspect hypertensive CM, though cannot r/o infiltrative CM (prior echo w/ speckled myocardium).  - cMRI (1/24): LVEF 54%, severe asymmetric septal hypertrophy but no LVOT or mitral valve SAM, RVEF 55%, extensive non-coronary pattern mid-wall LGE, findings suggestive of variant of HCM, cannot fully rule out cardiac amyloidosis, but ECV < 40% less suggestive. - Send [employer] gene testing to assess for HCM and TTR, and arrange PYP scan. - She wishes to be on less medications; NYHA I functional class; Will discontinue coreg  today.  - Continue Entresto  24/26mg  BID - Continue Jardiance  10 mg daily. - Intolerant spiro due to visual disturbance and hair loss. - Continue Lasix  as needed.    2. Hypertension -stable -Continue entresto  24/26mg  BID  3. RAS -Status post drug-eluting stent by Dr. Darron of the right renal artery. -Continue Plavix  and aspirin  - Followed yearly.    4. PAF - h/o TIA in 2001 w/ afib detection on Holter monitor.   5. Snoring -Sleep study pending  6.  Hyperlipidemia - Continue zetia   10 mg daily  -Check lipid panel. .  7. Pre-diabetes - A1C, BMP today.   Brooke HERO Mattias Walmsley DO 8:05 AM

## 2024-10-18 ENCOUNTER — Telehealth (HOSPITAL_COMMUNITY): Payer: Self-pay

## 2024-10-18 NOTE — Telephone Encounter (Signed)
 Called to confirm/remind patient of their appointment at the [facility] on 10/21/24 .   Appointment:   [] Confirmed  [x] Left mess   [] No answer/No voice mail  [] VM Full/unable to leave message  [] Phone not in service  Patient reminded to bring all medications and/or complete list.  Confirmed patient has transportation. Gave directions, instructed to utilize valet parking.

## 2024-10-21 ENCOUNTER — Ambulatory Visit (HOSPITAL_COMMUNITY)

## 2024-10-21 NOTE — Progress Notes (Incomplete)
 ADVANCED HF CLINIC CLINIC NOTE  PCP: Berneta Elsie Sayre, MD HF Cardiologist: Dr. Gardenia  CC: HFpEF  HPI: [facility] is a 78 y/o female w/ h/o poorly controlled HTN, chronic diastolic dysfunction w/ severe LVH by echocardiogram, TIA, PAF, bradycardia, HLD, remote tobacco use and ascending aortic aneurysm.    Admitted (1/21) w/ TIA, BP noted to be markedly elevated. Echo showed normal EF 60-65% + severe LVH w/ mild speckled appearance, GIDD and normal RV. Wore heart monitor after discharge which detected afib. Was referred to Afib clinic and started Eliquis . She had intolerance to high dose AVN blockers given bradycardia w/ occasional HRs in the 30s-40s. Unfortunately lost to f/u and not been seen by [facility] since 12/2019.    Seen in the [facility] ED on 12/03/22 for acute dyspnea and respiratory distress, required BiPAP. Chest CT negative for PE. CXR showed pulmonary edema. BP was elevated but not markedly high, 159/93. She was given dose of IV Lasix  w/ good UOP and symptomatic response. Able to wean off BiPAP back to room air. Monitored in ED x 3 hrs post treatment w/ stable O2 sats and was discharged home.   Admitted 4 days later 12/07/22 with recurrent respiratory distress. Required BiPAP. BP elevated 197/113. Concern for recurrent flash pulmonary edema. Started on NTG gtt and given 80 mg IV Lasix . HsTroponin elevated. No chest pain or obvious ACS, elevation felt to be secondary to demain ischemia and hypertensive urgency. AHF consulted to assist with HF management. Echo showed  EF 60-65%, severe LVH, RV normal, trivial MR, mild TR. cMRI showed variant of hypertrophic cardiomyopathy LVEF 54% RV 55%. 2 week zio patch placed with remote history of AFib. GDMT initially limited by AKI. GDMT titrated and she was discharged home, weight 125 lbs.  Interval hx:     Past Medical History:  Diagnosis Date   A-fib (HCC)    Cardiomegaly    Glaucoma    Hypertension    Current  Outpatient Medications  Medication Sig Dispense Refill   acetaminophen  (TYLENOL ) 500 MG tablet Take 500 mg by mouth every 6 (six) hours as needed for mild pain (pain score 1-3), headache or fever.     Ascorbic Acid (VITAMIN C) 1000 MG tablet Take 1,000 mg by mouth daily.     aspirin  EC 81 MG tablet Take 1 tablet (81 mg total) by mouth daily. Swallow whole. 90 tablet 3   Cholecalciferol (VITAMIN D3) 125 MCG (5000 UT) TABS Take 5,000 mcg by mouth daily.     empagliflozin  (JARDIANCE ) 10 MG TABS tablet TAKE 1 TABLET BY MOUTH ONCE DAILY BEFORE BREAKFAST 90 tablet 1   ezetimibe  (ZETIA ) 10 MG tablet Take 1 tablet (10 mg total) by mouth daily. 90 tablet 3   ferrous sulfate  325 (65 FE) MG EC tablet Take 325 mg by mouth daily as needed (cold hands).     fluticasone  (FLONASE ) 50 MCG/ACT nasal spray Place 2 sprays into both nostrils daily as needed for allergies or rhinitis.     latanoprost  (XALATAN ) 0.005 % ophthalmic solution Place 1 drop into both eyes at bedtime.      sacubitril -valsartan  (ENTRESTO ) 24-26 MG Take 1 tablet by mouth twice daily 180 tablet 2   No current facility-administered medications for this visit.     There were no vitals taken for this visit.  Wt Readings from Last 3 Encounters:  05/24/24 58.6 kg (129 lb 3.2 oz)  04/23/24 59.5 kg (131 lb 3.2 oz)  01/16/24 59.1 kg (130  lb 6.4 oz)   PHYSICAL EXAM: There were no vitals filed for this visit.  GENERAL: NAD Lungs- CTA CARDIAC:  JVP: 6 cm          Normal rate with regular rhythm. no murmur.  Pulses 2+. no edema.  ABDOMEN: Soft, non-tender, non-distended.  EXTREMITIES: Warm and well perfused.  NEUROLOGIC: No obvious FND    Cardiac Studies - cMRI (1/24): LVEF 54%, severe asymmetric septal hypertrophy but no LVOT or mitral valve SAM, RVEF 55%, extensive non-coronary pattern mid-wall LGE, findings suggestive of variant of HCM, cannot fully rule out cardiac amyloidosis, but ECV < 40% less suggestive.  - Echo (1/24): EF  60-65%, severe LVH, RV normal, trivial MR, mild TR  - Heart Monitor (2/21)>>mostly SR at 55 bpm, episodes of afib 2/22 and 2/23 with v rates of 160-180 bpm. 2 runs of VT of 4-5 beats.    - Echo (1/21): EF 60-65%, severe LVH w/ mild speckled appearance, grade I DD, normal RV  ASSESSMENT & PLAN: 1. Chronic Diastolic Heart Failure - Echo (2021): EF 60-65%, severe LVH w/ mild speckled appearance - Echo (1/24): EF 60-65%. Severe LVH. RV normal, trivial MR, mild TR - suspect hypertensive CM, though cannot r/o infiltrative CM (prior echo w/ speckled myocardium).  - cMRI (1/24): LVEF 54%, severe asymmetric septal hypertrophy but no LVOT or mitral valve SAM, RVEF 55%, extensive non-coronary pattern mid-wall LGE, findings suggestive of variant of HCM, cannot fully rule out cardiac amyloidosis, but ECV < 40% less suggestive. [place] Koch 7/25: LVEF 70-75%, moderate LVH, RV okay - Send [employer] gene testing to assess for HCM and TTR, and arrange PYP scan. - She wishes to be on less medications; NYHA I functional class; Will discontinue coreg  today.  - Continue Entresto  24/26mg  BID - Continue Jardiance  10 mg daily. - Intolerant spiro due to visual disturbance and hair loss. - Continue Lasix  as needed.    2. Hypertension -stable -Continue entresto  24/26mg  BID  3. RAS -Status post drug-eluting stent by Dr. Darron of the right renal artery. -Continue Plavix  and aspirin  - Followed yearly.    4. PAF - h/o TIA in 2001 w/ afib detection on Holter monitor.   5. Snoring -Sleep study pending  6. Hyperlipidemia - Continue zetia   10 mg daily  -Check lipid panel.   7. Pre-diabetes - A1C, BMP today.   Follow-up: ***  Jaylina Ramdass N PA-C 8:21 AM

## 2024-10-30 ENCOUNTER — Telehealth (HOSPITAL_COMMUNITY): Payer: Self-pay

## 2024-10-30 NOTE — Progress Notes (Signed)
 ADVANCED HF CLINIC CLINIC NOTE  PCP: Berneta Elsie Sayre, MD HF Cardiologist: Dr. Zenaida  CC: HFpEF  HPI: Ms Morgenthaler is a 78 y/o female w/ h/o poorly controlled HTN, chronic diastolic dysfunction w/ severe LVH by echocardiogram, TIA, PAF, bradycardia, HLD, remote tobacco use and ascending aortic aneurysm.    Admitted 1/21 w/ TIA, BP noted to be markedly elevated. Echo showed EF 60-65% + severe LVH w/ mild speckled appearance, GIDD and normal RV. Wore heart monitor after discharge which detected afib. Was referred to Afib clinic and started Eliquis . She had intolerance to high dose AVN blockers given bradycardia w/ occasional HRs in the 30s-40s. Unfortunately lost to f/u and not been seen by [facility] since 12/2019.    Seen in the [facility] ED on 1//24 for acute dyspnea and respiratory distress, required BiPAP. CTPE negative. She was diuresed and discharged home. She was then admitted 4 days later with flash pulmonary edema. Echo showed  EF 60-65%, severe LVH, RV normal, trivial MR, mild TR. cMRI showed variant of hypertrophic cardiomyopathy LVEF 54% RV 55%. GDMT initially limited by AKI. GDMT titrated and she was discharged home, weight 125 lbs.  Most recent Echo 6/25 EF 70-75%, mod LVH, nl RV  She returns today for HF follow up. Overall feeling well. NYHA I. No SOB or chest pain. She does have a yeast infection and has been off her jardiance  for the past 4 days. Able to perform ADLs. Appetite okay. Compliant with all medications. Concerned about medication side effects.  Cardiac Studies - Echo (6/25): EF 70-75%, mod LVH, RV stable.  - Zio (2/24): predom SR at 61 bpm, 2 VT (11b), 73 SVT (longest ~6 min with avg HR 128) - cMRI (1/24): LVEF 54%, severe asymmetric septal hypertrophy but no LVOT or mitral valve SAM, RVEF 55%, extensive non-coronary pattern mid-wall LGE, findings suggestive of variant of HCM, cannot fully rule out cardiac amyloidosis, but ECV < 40% less suggestive. -  Echo (1/24): EF 60-65%, severe LVH, RV normal, trivial MR, mild TR - Heart Monitor (2/21): mostly SR at 55 bpm, episodes of afib 2/22 and 2/23 with v rates of 160-180 bpm. 2 runs of VT of 4-5 beats,  - Echo (1/21): EF 60-65%, severe LVH w/ mild speckled appearance, grade I DD, normal RV   Past Medical History:  Diagnosis Date   A-fib (HCC)    Cardiomegaly    Glaucoma    Hypertension    Current Outpatient Medications  Medication Sig Dispense Refill   acetaminophen  (TYLENOL ) 500 MG tablet Take 500 mg by mouth every 6 (six) hours as needed for mild pain (pain score 1-3), headache or fever.     Ascorbic Acid (VITAMIN C) 1000 MG tablet Take 1,000 mg by mouth daily.     aspirin  EC 81 MG tablet Take 1 tablet (81 mg total) by mouth daily. Swallow whole. 90 tablet 3   ezetimibe  (ZETIA ) 10 MG tablet Take 1 tablet (10 mg total) by mouth daily. 90 tablet 3   ferrous sulfate  325 (65 FE) MG EC tablet Take 325 mg by mouth daily as needed (cold hands).     Finerenone  (KERENDIA ) 20 MG TABS Take 1 tablet (20 mg total) by mouth daily. 30 tablet 11   fluticasone  (FLONASE ) 50 MCG/ACT nasal spray Place 2 sprays into both nostrils daily as needed for allergies or rhinitis.     latanoprost  (XALATAN ) 0.005 % ophthalmic solution Place 1 drop into both eyes at bedtime.  sacubitril -valsartan  (ENTRESTO ) 24-26 MG Take 1 tablet by mouth twice daily 180 tablet 2   Cholecalciferol (VITAMIN D3) 125 MCG (5000 UT) TABS Take 5,000 mcg by mouth daily. (Patient not taking: Reported on 10/31/2024)     No current facility-administered medications for this encounter.   Blood pressure 122/84, pulse 61, weight 57.7 kg (127 lb 3.2 oz), SpO2 100%.  Wt Readings from Last 3 Encounters:  10/31/24 57.7 kg (127 lb 3.2 oz)  05/24/24 58.6 kg (129 lb 3.2 oz)  04/23/24 59.5 kg (131 lb 3.2 oz)   PHYSICAL EXAM: General: Well appearing. No distress  Cardiac: JVP flat. No murmurs  Extremities: Warm and dry.  No edema.  Neuro: A&O x3.  Affect pleasant.   ECG (personally reviewed): SB 57 bpm, mod LVH  ASSESSMENT & PLAN: 1. Chronic Diastolic Heart Failure - Echo (2021): EF 60-65%, severe LVH w/ mild speckled appearance - Echo (1/24): EF 60-65%. Severe LVH. RV normal, trivial MR, mild TR - suspect hypertensive CM, though cannot r/o infiltrative CM (prior echo w/ speckled myocardium).  - cMRI (1/24): LVEF 54%, severe asymmetric septal hypertrophy but no LVOT or mitral valve SAM, RVEF 55%, extensive non-coronary pattern mid-wall LGE, findings suggestive of variant of HCM, cannot fully rule out cardiac amyloidosis, but ECV < 40% less suggestive. - Genetic testing with 2 variants of uncertain significance: DHTKD1, POLG - Echo (7/25): LVEF 70-75%, moderate LVH, RV okay - PYP has been ordered x2, she has not completed - NYHA I. Euvolemic on exam. Has PRN lasix , has not needed. - Continue entresto  24/26 mg BID - Trial finerenone  20 mg daily, repeat BMET in 10-14 days.  - Off jardiance  with yeast infections - Intolerant spiro due to visual disturbance and hair loss   2. Hypertension - controlled on entresto   3. RAS - s/p drug-eluting stent by Dr. Darron of the right renal artery. - patent on renal US  10/25 - continue Plavix  and aspirin  - followed annually by Dr. Darron.    4. PAF - h/o TIA in 2001 w/ afib detection on Holter monitor - note noted on Holter 2/24  5. SVT - Zio (14d) 2/24: predom SR. 2 VT (11b), 73 SVT runs (longest ~79min) - coreg  stopped at last visit - no palpitations, ECG SR today - consider repeat Zio at next follow up  6. Snoring - did not complete home sleep study  7. Hyperlipidemia - Continue zetia  10 mg daily  - per PCP  Follow up in 6 month with Dr. Stoner  Faithe Ariola, NP 10:27 AM

## 2024-10-30 NOTE — Telephone Encounter (Signed)
 Called to confirm/remind patient of their appointment at the [facility] on 10/31/24.   Appointment:   [] Confirmed  [x] Left mess   [] No answer/No voice mail  [] VM Full/unable to leave message  [] Phone not in service  And to bring in all medications and/or complete list.

## 2024-10-31 ENCOUNTER — Other Ambulatory Visit (HOSPITAL_COMMUNITY): Payer: Self-pay | Admitting: Cardiology

## 2024-10-31 ENCOUNTER — Telehealth (HOSPITAL_COMMUNITY): Payer: Self-pay

## 2024-10-31 ENCOUNTER — Encounter (HOSPITAL_COMMUNITY): Payer: Self-pay

## 2024-10-31 ENCOUNTER — Ambulatory Visit (HOSPITAL_COMMUNITY)
Admission: RE | Admit: 2024-10-31 | Discharge: 2024-10-31 | Disposition: A | Discharge status: Home / Self Care | Source: Ambulatory Visit | Attending: Cardiology | Admitting: Cardiology

## 2024-10-31 ENCOUNTER — Other Ambulatory Visit (HOSPITAL_COMMUNITY): Payer: Self-pay

## 2024-10-31 VITALS — BP 122/84 | HR 61 | Wt 127.2 lb

## 2024-10-31 DIAGNOSIS — Z8673 Personal history of transient ischemic attack (TIA), and cerebral infarction without residual deficits: Secondary | ICD-10-CM | POA: Diagnosis not present

## 2024-10-31 DIAGNOSIS — Z79899 Other long term (current) drug therapy: Secondary | ICD-10-CM | POA: Diagnosis not present

## 2024-10-31 DIAGNOSIS — I10 Essential (primary) hypertension: Secondary | ICD-10-CM

## 2024-10-31 DIAGNOSIS — I701 Atherosclerosis of renal artery: Secondary | ICD-10-CM

## 2024-10-31 DIAGNOSIS — Z7901 Long term (current) use of anticoagulants: Secondary | ICD-10-CM | POA: Diagnosis not present

## 2024-10-31 DIAGNOSIS — Z87891 Personal history of nicotine dependence: Secondary | ICD-10-CM | POA: Insufficient documentation

## 2024-10-31 DIAGNOSIS — R0683 Snoring: Secondary | ICD-10-CM | POA: Insufficient documentation

## 2024-10-31 DIAGNOSIS — E785 Hyperlipidemia, unspecified: Secondary | ICD-10-CM | POA: Diagnosis not present

## 2024-10-31 DIAGNOSIS — I471 Supraventricular tachycardia, unspecified: Secondary | ICD-10-CM | POA: Insufficient documentation

## 2024-10-31 DIAGNOSIS — I480 Paroxysmal atrial fibrillation: Secondary | ICD-10-CM | POA: Insufficient documentation

## 2024-10-31 DIAGNOSIS — Z955 Presence of coronary angioplasty implant and graft: Secondary | ICD-10-CM | POA: Diagnosis not present

## 2024-10-31 DIAGNOSIS — I110 Hypertensive heart disease with heart failure: Secondary | ICD-10-CM | POA: Insufficient documentation

## 2024-10-31 DIAGNOSIS — I5032 Chronic diastolic (congestive) heart failure: Secondary | ICD-10-CM | POA: Insufficient documentation

## 2024-10-31 DIAGNOSIS — Z7902 Long term (current) use of antithrombotics/antiplatelets: Secondary | ICD-10-CM | POA: Insufficient documentation

## 2024-10-31 DIAGNOSIS — Z7982 Long term (current) use of aspirin: Secondary | ICD-10-CM | POA: Diagnosis not present

## 2024-10-31 MED ORDER — KERENDIA 20 MG PO TABS: 20.0000 mg | ORAL_TABLET | Freq: Every day | ORAL | 11 refills | Status: DC

## 2024-10-31 NOTE — Patient Instructions (Signed)
 STOP Jardiance .  START Kerenida 20 mg daily.  DO NOT START FARXIGA.  BLOOD WORK IN 2 WEEKS.  Your physician recommends that you schedule a follow-up appointment in: 6 months ( June 2026) ** PLEASE CALL THE OFFICE IN APRIL TO ARRANGE YOUR FOLLOW UP APPOINTMENT.**  If you have any questions or concerns before your next appointment please send us  a message through [place] or call our office at [phone].    TO LEAVE A MESSAGE FOR THE NURSE SELECT OPTION 2, PLEASE LEAVE A MESSAGE INCLUDING: YOUR NAME DATE OF BIRTH CALL BACK NUMBER REASON FOR CALL**this is important as we prioritize the call backs  YOU WILL RECEIVE A CALL BACK THE SAME DAY AS LONG AS YOU CALL BEFORE 4:00 PM  At the [facility], you and your health needs are our priority. As part of our continuing mission to provide you with exceptional heart care, we have created designated Provider Care Teams. These Care Teams include your primary Cardiologist (physician) and Advanced Practice Providers (APPs- Physician Assistants and Nurse Practitioners) who all work together to provide you with the care you need, when you need it.   You may see any of the following providers on your designated Care Team at your next follow up: Dr Toribio Fuel Dr Ezra Shuck Dr. Morene Brownie Greig Mosses, NP Caffie Shed, [place] [facility], [place] Beckey Coe, NP Jordan Lee, NP Ellouise Class, NP Tinnie Redman, PharmD Jaun Bash, PharmD   Please be sure to bring in all your medications bottles to every appointment.    Thank you for choosing [facility]

## 2024-10-31 NOTE — Telephone Encounter (Signed)
 Advanced Heart Failure Patient Advocate Encounter  Test billing for this patient's current coverage ([employer]) returns a $47 copay for 30 day supply of Kerendia , and $97.62 for a 90 day supply.  This test claim was processed through [facility]- copay amounts may vary at other pharmacies due to pharmacy/plan contracts, or as the patient moves through the different stages of their insurance plan.  Rachel DEL, CPhT Rx Patient Advocate Phone: [phone]

## 2024-11-09 ENCOUNTER — Inpatient Hospital Stay (HOSPITAL_COMMUNITY)
Admission: EM | Admit: 2024-11-09 | Discharge: 2024-11-13 | DRG: 871 | Disposition: A | Discharge status: Home / Self Care

## 2024-11-09 ENCOUNTER — Emergency Department (HOSPITAL_COMMUNITY)

## 2024-11-09 ENCOUNTER — Encounter (HOSPITAL_COMMUNITY): Payer: Self-pay | Admitting: Emergency Medicine

## 2024-11-09 ENCOUNTER — Other Ambulatory Visit: Payer: Self-pay

## 2024-11-09 DIAGNOSIS — T380X5A Adverse effect of glucocorticoids and synthetic analogues, initial encounter: Secondary | ICD-10-CM | POA: Diagnosis present

## 2024-11-09 DIAGNOSIS — J1001 Influenza due to other identified influenza virus with the same other identified influenza virus pneumonia: Secondary | ICD-10-CM | POA: Diagnosis present

## 2024-11-09 DIAGNOSIS — I701 Atherosclerosis of renal artery: Secondary | ICD-10-CM | POA: Diagnosis present

## 2024-11-09 DIAGNOSIS — J101 Influenza due to other identified influenza virus with other respiratory manifestations: Principal | ICD-10-CM | POA: Diagnosis present

## 2024-11-09 DIAGNOSIS — A419 Sepsis, unspecified organism: Secondary | ICD-10-CM | POA: Insufficient documentation

## 2024-11-09 DIAGNOSIS — Z79899 Other long term (current) drug therapy: Secondary | ICD-10-CM

## 2024-11-09 DIAGNOSIS — F32A Depression, unspecified: Secondary | ICD-10-CM | POA: Diagnosis present

## 2024-11-09 DIAGNOSIS — A4189 Other specified sepsis: Principal | ICD-10-CM | POA: Diagnosis present

## 2024-11-09 DIAGNOSIS — Z7982 Long term (current) use of aspirin: Secondary | ICD-10-CM

## 2024-11-09 DIAGNOSIS — J9601 Acute respiratory failure with hypoxia: Secondary | ICD-10-CM | POA: Diagnosis present

## 2024-11-09 DIAGNOSIS — I110 Hypertensive heart disease with heart failure: Secondary | ICD-10-CM | POA: Diagnosis present

## 2024-11-09 DIAGNOSIS — I5033 Acute on chronic diastolic (congestive) heart failure: Secondary | ICD-10-CM | POA: Diagnosis present

## 2024-11-09 DIAGNOSIS — R7989 Other specified abnormal findings of blood chemistry: Secondary | ICD-10-CM | POA: Diagnosis present

## 2024-11-09 DIAGNOSIS — F17210 Nicotine dependence, cigarettes, uncomplicated: Secondary | ICD-10-CM | POA: Diagnosis present

## 2024-11-09 DIAGNOSIS — Z880 Allergy status to penicillin: Secondary | ICD-10-CM

## 2024-11-09 DIAGNOSIS — I161 Hypertensive emergency: Secondary | ICD-10-CM | POA: Diagnosis present

## 2024-11-09 DIAGNOSIS — I422 Other hypertrophic cardiomyopathy: Secondary | ICD-10-CM | POA: Diagnosis present

## 2024-11-09 DIAGNOSIS — I5043 Acute on chronic combined systolic (congestive) and diastolic (congestive) heart failure: Secondary | ICD-10-CM

## 2024-11-09 DIAGNOSIS — Z8673 Personal history of transient ischemic attack (TIA), and cerebral infarction without residual deficits: Secondary | ICD-10-CM

## 2024-11-09 DIAGNOSIS — Z1152 Encounter for screening for COVID-19: Secondary | ICD-10-CM

## 2024-11-09 DIAGNOSIS — H409 Unspecified glaucoma: Secondary | ICD-10-CM | POA: Diagnosis present

## 2024-11-09 DIAGNOSIS — I5A Non-ischemic myocardial injury (non-traumatic): Secondary | ICD-10-CM | POA: Diagnosis present

## 2024-11-09 DIAGNOSIS — E785 Hyperlipidemia, unspecified: Secondary | ICD-10-CM | POA: Diagnosis present

## 2024-11-09 DIAGNOSIS — I480 Paroxysmal atrial fibrillation: Secondary | ICD-10-CM | POA: Diagnosis present

## 2024-11-09 DIAGNOSIS — R0902 Hypoxemia: Secondary | ICD-10-CM

## 2024-11-09 DIAGNOSIS — I2489 Other forms of acute ischemic heart disease: Secondary | ICD-10-CM | POA: Diagnosis present

## 2024-11-09 LAB — COMPREHENSIVE METABOLIC PANEL WITH GFR
ALT: 49 U/L — ABNORMAL HIGH (ref 0–44)
AST: 59 U/L — ABNORMAL HIGH (ref 15–41)
Albumin: 4.4 g/dL (ref 3.5–5.0)
Alkaline Phosphatase: 83 U/L (ref 38–126)
Anion gap: 12 (ref 5–15)
BUN: 14 mg/dL (ref 8–23)
CO2: 23 mmol/L (ref 22–32)
Calcium: 9.1 mg/dL (ref 8.9–10.3)
Chloride: 102 mmol/L (ref 98–111)
Creatinine, Ser: 0.77 mg/dL (ref 0.44–1.00)
GFR, Estimated: >60 mL/min
Glucose, Bld: 221 mg/dL — ABNORMAL HIGH (ref 70–99)
Potassium: 4 mmol/L (ref 3.5–5.1)
Sodium: 137 mmol/L (ref 135–145)
Total Bilirubin: 0.4 mg/dL (ref 0.0–1.2)
Total Protein: 7.5 g/dL (ref 6.5–8.1)

## 2024-11-09 LAB — CBC
HCT: 43.3 % (ref 36.0–46.0)
Hemoglobin: 14.9 g/dL (ref 12.0–15.0)
MCH: 31.6 pg (ref 26.0–34.0)
MCHC: 34.4 g/dL (ref 30.0–36.0)
MCV: 91.9 fL (ref 80.0–100.0)
Platelets: 162 K/uL (ref 150–400)
RBC: 4.71 MIL/uL (ref 3.87–5.11)
RDW: 12.9 % (ref 11.5–15.5)
WBC: 11.6 K/uL — ABNORMAL HIGH (ref 4.0–10.5)
nRBC: 0 % (ref 0.0–0.2)

## 2024-11-09 LAB — PRO BRAIN NATRIURETIC PEPTIDE: Pro Brain Natriuretic Peptide: 1400 pg/mL — ABNORMAL HIGH

## 2024-11-09 LAB — RESP PANEL BY RT-PCR (RSV, FLU A&B, COVID)  RVPGX2
Influenza A by PCR: POSITIVE — AB
Influenza B by PCR: NEGATIVE
Resp Syncytial Virus by PCR: NEGATIVE
SARS Coronavirus 2 by RT PCR: NEGATIVE

## 2024-11-09 LAB — PROTIME-INR
INR: 1.1 (ref 0.8–1.2)
Prothrombin Time: 15.1 s (ref 11.4–15.2)

## 2024-11-09 LAB — TROPONIN T, HIGH SENSITIVITY: Troponin T High Sensitivity: 48 ng/L — ABNORMAL HIGH (ref 0–19)

## 2024-11-09 MED ORDER — SODIUM CHLORIDE 0.9 % IV SOLN
500.0000 mg | Freq: Once | INTRAVENOUS | Status: AC
  Administered 2024-11-09: 500 mg via INTRAVENOUS
  Filled 2024-11-09: qty 5

## 2024-11-09 MED ORDER — ALBUTEROL SULFATE (2.5 MG/3ML) 0.083% IN NEBU
2.5000 mg | INHALATION_SOLUTION | RESPIRATORY_TRACT | Status: DC | PRN
  Administered 2024-11-09: 2.5 mg via RESPIRATORY_TRACT
  Filled 2024-11-09: qty 3

## 2024-11-09 MED ORDER — SODIUM CHLORIDE 0.9 % IV SOLN
1.0000 g | Freq: Once | INTRAVENOUS | Status: AC
  Administered 2024-11-09: 1 g via INTRAVENOUS
  Filled 2024-11-09: qty 10

## 2024-11-09 MED ORDER — ALBUTEROL SULFATE HFA 108 (90 BASE) MCG/ACT IN AERS: 2.0000 | INHALATION_SPRAY | RESPIRATORY_TRACT | Status: DC | PRN

## 2024-11-09 MED ORDER — NITROGLYCERIN 2 % TD OINT
1.0000 [in_us] | TOPICAL_OINTMENT | Freq: Once | TRANSDERMAL | Status: AC
  Administered 2024-11-09: 1 [in_us] via TOPICAL
  Filled 2024-11-09: qty 1

## 2024-11-09 NOTE — ED Notes (Signed)
 RT and MD at bedside.

## 2024-11-09 NOTE — ED Triage Notes (Signed)
 Pt via EMS from home c.o sudden onset SOB. Hx CHF. O2 76% on room air, bilateral exp wheezes. Duoneb x 2, 2g Mag, 4mg  zofran , 125mg  solumedrol, 0.5mg  epi IM in right shoulder. Minimal improvement noted.   BP 240/140 HR 120 ST/PVC O2 96% at 10L with duoneb 18ga in left AC

## 2024-11-09 NOTE — ED Provider Notes (Signed)
 " [place] [facility] Provider Note   CSN: [id number] Arrival date & time: 11/09/24  2052     Patient presents with: Shortness of Breath   Brooke Gray is a 78 y.o. female.   Patient to ED by EMS reporting sudden onset shortness of breath while at rest this evening. EMS reporting 76% O2 saturation on their arrival. The patient denies chest pain. She had vomiting in route but denies ongoing nausea. No recent fever. Per chart review, history of CHF, afib, HTN. On arrival, patient is in severe respiratory distress, significantly hypertensive at 231/130, HR 130's, diaphoretic. She received duonebs x 2 by EMS without significant change.   The history is provided by the patient. No language interpreter was used.  Shortness of Breath      Prior to Admission medications  Medication Sig Start Date End Date Taking? Authorizing Provider  acetaminophen  (TYLENOL ) 500 MG tablet Take 500 mg by mouth every 6 (six) hours as needed for mild pain (pain score 1-3), headache or fever.    [provider]  Ascorbic Acid (VITAMIN C) 1000 MG tablet Take 1,000 mg by mouth daily.    [provider]  aspirin  EC 81 MG tablet Take 1 tablet (81 mg total) by mouth daily. Swallow whole. 12/13/22   Arida, Muhammad A, MD  Cholecalciferol (VITAMIN D3) 125 MCG (5000 UT) TABS Take 5,000 mcg by mouth daily. Patient not taking: Reported on 10/31/2024    [provider]  ezetimibe  (ZETIA ) 10 MG tablet Take 1 tablet (10 mg total) by mouth daily. 01/16/24 04/23/25  Darron Deatrice LABOR, MD  ferrous sulfate  325 (65 FE) MG EC tablet Take 325 mg by mouth daily as needed (cold hands).    [provider]  Finerenone  (KERENDIA ) 20 MG TABS Take 1 tablet (20 mg total) by mouth daily. 10/31/24   Lee, Jordan, NP  fluticasone  (FLONASE ) 50 MCG/ACT nasal spray Place 2 sprays into both nostrils daily as needed for allergies or rhinitis.    [provider]   latanoprost  (XALATAN ) 0.005 % ophthalmic solution Place 1 drop into both eyes at bedtime.  03/30/19   [provider]  sacubitril -valsartan  (ENTRESTO ) 24-26 MG Take 1 tablet by mouth twice daily 01/16/24   Sabharwal, Aditya, DO    Allergies: Spironolactone  and Penicillins    Review of Systems  Respiratory:  Positive for shortness of breath.     Updated Vital Signs BP (!) 104/93   Pulse 89   Temp 97.6 F (36.4 C) (Axillary)   Resp 18   Ht 5' 3 (1.6 m)   Wt 57.6 kg   SpO2 95%   BMI 22.50 kg/m   Physical Exam Vitals and nursing note reviewed.  Constitutional:      General: She is in acute distress.     Appearance: She is diaphoretic.  HENT:     Head: Normocephalic and atraumatic.  Cardiovascular:     Rate and Rhythm: Tachycardia present.  Pulmonary:     Effort: Tachypnea and respiratory distress present.     Comments: Prolonged expirations, accessory muscle use. Inspiratory and expiratory rales throughout.  Abdominal:     Palpations: Abdomen is soft.  Musculoskeletal:     Right lower leg: No edema.     Left lower leg: No edema.  Skin:    Coloration: Skin is not pale.  Neurological:     Comments: Responsive.      (all labs ordered are listed, but only abnormal results  are displayed) Labs Reviewed  RESP PANEL BY RT-PCR (RSV, FLU A&B, COVID)  RVPGX2 - Abnormal; Notable for the following components:      Result Value   Influenza A by PCR POSITIVE (*)    All other components within normal limits  CBC - Abnormal; Notable for the following components:   WBC 11.6 (*)    All other components within normal limits  PRO BRAIN NATRIURETIC PEPTIDE - Abnormal; Notable for the following components:   Pro Brain Natriuretic Peptide 1,400.0 (*)    All other components within normal limits  COMPREHENSIVE METABOLIC PANEL WITH GFR - Abnormal; Notable for the following components:   Glucose, Bld 221 (*)    AST 59 (*)    ALT 49 (*)    All other components within normal  limits  TROPONIN T, HIGH SENSITIVITY - Abnormal; Notable for the following components:   Troponin T High Sensitivity 48 (*)    All other components within normal limits  PROTIME-INR  TROPONIN T, HIGH SENSITIVITY    EKG: EKG Interpretation Date/Time:  Saturday November 09 2024 20:57:46 EST Ventricular Rate:  123 PR Interval:  172 QRS Duration:  83 QT Interval:  299 QTC Calculation: 428 R Axis:   -65  Text Interpretation: Sinus tachycardia Probable left atrial enlargement LVH with secondary repolarization abnormality Inferior infarct, acute (RCA) Anterior infarct, acute Lateral leads are also involved Probable RV involvement, suggest recording right precordial leads >>> Acute MI <<< sinus tachycardia, no STEMI Confirmed by Bari Flank [phone]) on 11/09/2024 8:59:20 PM  Radiology: [facility] Chest Port 1 View Result Date: 11/09/2024 EXAM: 1 VIEW(S) XRAY OF THE CHEST 11/09/2024 09:19:00 PM COMPARISON: None available. CLINICAL HISTORY: sob FINDINGS: LUNGS AND PLEURA: Moderate interstitial and alveolar pulmonary edema, with asymmetric infiltrate at the right lung base likely reflecting asymmetric pulmonary edema. Superimposed pneumonic consolidation, however, is difficult to exclude. This appears similar to prior examination of 11/07/2023, favoring recurrent edema, however. Superimposed small right pleural effusion. No pneumothorax. HEART AND MEDIASTINUM: Heart size at upper limits. Aortic atherosclerosis. BONES AND SOFT TISSUES: No acute osseous abnormality. IMPRESSION: 1. Moderate interstitial and alveolar pulmonary edema with asymmetric right basilar opacity, likely asymmetric pulmonary edema; superimposed pneumonic consolidation is difficult to exclude. 2. Small right pleural effusion. 3. Heart size at the upper limits of normal. 4. Aortic atherosclerosis. Electronically signed by: Dorethia Molt MD 11/09/2024 10:29 PM EST RP Workstation: [id number]     .Critical Care  Performed by: Odell Balls, PA-C Authorized by: Odell Balls, PA-C   Critical care provider statement:    Critical care time (minutes):  50   Critical care was necessary to treat or prevent imminent or life-threatening deterioration of the following conditions:  Respiratory failure   Critical care was time spent personally by me on the following activities:  Development of treatment plan with patient or surrogate, discussions with consultants, discussions with primary provider, evaluation of patient's response to treatment, examination of patient, interpretation of cardiac output measurements, ordering and review of laboratory studies, ordering and review of radiographic studies, ordering and performing treatments and interventions, pulse oximetry, re-evaluation of patient's condition and review of old charts    Medications Ordered in the ED  albuterol  (PROVENTIL ) (2.5 MG/3ML) 0.083% nebulizer solution 2.5 mg (2.5 mg Nebulization Given 11/09/24 2112)  nitroGLYCERIN  (NITROGLYN) 2 % ointment 1 inch (1 inch Topical Given 11/09/24 2102)  cefTRIAXone  (ROCEPHIN ) 1 g in sodium chloride  0.9 % 100 mL IVPB (0 g Intravenous Stopped 11/09/24 2214)  azithromycin  (ZITHROMAX ) 500  mg in sodium chloride  0.9 % 250 mL IVPB (0 mg Intravenous Stopped 11/09/24 2253)    Clinical Course as of 11/09/24 2356  Sat Nov 09, 2024  2228 Recheck - Blood pressure 110 systolic. HR 106. Breathing easily. Feel Bipap can be removed - RT made aware.  [SU]  2255 NTG paste removed as BP going low, last 98/76.  [SU]    Clinical Course User Index [SU] Odell Balls, PA-C                                 Medical Decision Making This patient presents to the ED for concern of sudden onset severe SOB, this involves an extensive number of treatment options, and is a complaint that carries with it a high risk of complications and morbidity.  The differential diagnosis includes ACS, CHF, flash edema, PNA, PE, PTX, COPD   Co morbidities / Chronic  conditions that complicate the patient evaluation  poorly controlled HTN, chronic diastolic dysfunction w/ severe LVH by echocardiogram, TIA, PAF, bradycardia, HLD, remote tobacco use and ascending aortic aneurysm.  Additional history obtained: Cardiology office note references history of afib in 2021, started on Eliquis  and then lost to follow up  Additional history obtained from EMR External records from outside source obtained and reviewed including na/   Lab Tests:  I Ordered, and personally interpreted labs.  The pertinent results include:       Imaging Studies ordered:  I ordered imaging studies including CXR  Per radiologist interpretation: IMPRESSION: 1. Moderate interstitial and alveolar pulmonary edema with asymmetric right basilar opacity, likely asymmetric pulmonary edema; superimposed pneumonic consolidation is difficult to exclude. 2. Small right pleural effusion. 3. Heart size at the upper limits of normal. 4. Aortic atherosclerosis.    Cardiac Monitoring: / EKG:  The patient was maintained on a cardiac monitor.  I personally viewed and interpreted the cardiac monitored which showed an underlying rhythm of: Sinus tachycardia EKG Interpretation Date/Time:  Saturday November 09 2024 20:57:46 EST Ventricular Rate:  123 PR Interval:  172 QRS Duration:  83 QT Interval:  299 QTC Calculation: 428 R Axis:   -65  Text Interpretation: Sinus tachycardia Probable left atrial enlargement LVH with secondary repolarization abnormality Inferior infarct, acute (RCA) Anterior infarct, acute Lateral leads are also involved Probable RV involvement, suggest recording right precordial leads >>> Acute MI <<< sinus tachycardia, no STEMI Confirmed by Bari Flank [phone]) on 11/09/2024 8:59:20 PM  Problem List / ED Course / Critical interventions / Medication management Dr. MARLA. Horton at bedside on arrival  I ordered medication including nitroglycerin  paste, BiPap started  immediately, 2 IV's initiated.  Reevaluation of the patient after these medicines showed that the patient  21:25: Patient improving on BiPap, BP decreasing now 156/106, HR 121 22:40 - BP 110 systolic, HR 106. Breathing has slowed and patient no longer in distress. Ordered BiPap removed.    I have reviewed the patients home medicines and have made adjustments as needed   Consultations Obtained:  I requested consultation with the hospitalist for admission,  and discussed lab and imaging findings as well as pertinent plan.   Social Determinants of Health:  Lives at home w/husband   Test / Admission - Considered:  Arrived in significant respiratory distress, BP 231/130, tachycardic, hypoxic BiPap placed, nitroglycerin  paste - resulting in normalized blood pressure, reduced HR.  CXR significant abnormal - consider mixed picture edema/fluid overload with admission for similar  presentation in January 2024 vs infection - Lasix  considered but BP 98 systolic. NTG paste removed. She is weaned from BiPap. BP improved to 110 - 40 mg Lasix  ordered. Of note, was recently established as a [facility] and medications were changed 12/18. Antibiotics started for CAP  Influenza A+ Stable - will Admit     Amount and/or Complexity of Data Reviewed Labs: ordered. Radiology: ordered.  Risk Prescription drug management. Decision regarding hospitalization.        Final diagnoses:  Influenza A  Hypoxia  Acute on chronic combined systolic and diastolic congestive heart failure [facility])    ED Discharge Orders     None          Odell Balls, PA-C 11/09/24 2356  "

## 2024-11-09 NOTE — ED Notes (Addendum)
 PA  Margit Paris removed nitroglycerin  paste due to dropping BP. PA states ok to wean off of BIPAP.

## 2024-11-10 ENCOUNTER — Encounter (HOSPITAL_COMMUNITY): Payer: Self-pay | Admitting: Internal Medicine

## 2024-11-10 ENCOUNTER — Inpatient Hospital Stay (HOSPITAL_COMMUNITY)

## 2024-11-10 ENCOUNTER — Emergency Department (HOSPITAL_COMMUNITY)

## 2024-11-10 DIAGNOSIS — I701 Atherosclerosis of renal artery: Secondary | ICD-10-CM | POA: Diagnosis present

## 2024-11-10 DIAGNOSIS — Z79899 Other long term (current) drug therapy: Secondary | ICD-10-CM | POA: Diagnosis not present

## 2024-11-10 DIAGNOSIS — I5031 Acute diastolic (congestive) heart failure: Secondary | ICD-10-CM | POA: Diagnosis not present

## 2024-11-10 DIAGNOSIS — I422 Other hypertrophic cardiomyopathy: Secondary | ICD-10-CM | POA: Diagnosis present

## 2024-11-10 DIAGNOSIS — F17210 Nicotine dependence, cigarettes, uncomplicated: Secondary | ICD-10-CM | POA: Diagnosis present

## 2024-11-10 DIAGNOSIS — Z7982 Long term (current) use of aspirin: Secondary | ICD-10-CM | POA: Diagnosis not present

## 2024-11-10 DIAGNOSIS — I5A Non-ischemic myocardial injury (non-traumatic): Secondary | ICD-10-CM | POA: Insufficient documentation

## 2024-11-10 DIAGNOSIS — J101 Influenza due to other identified influenza virus with other respiratory manifestations: Secondary | ICD-10-CM | POA: Diagnosis present

## 2024-11-10 DIAGNOSIS — I2489 Other forms of acute ischemic heart disease: Secondary | ICD-10-CM | POA: Diagnosis present

## 2024-11-10 DIAGNOSIS — R7989 Other specified abnormal findings of blood chemistry: Secondary | ICD-10-CM | POA: Diagnosis not present

## 2024-11-10 DIAGNOSIS — I480 Paroxysmal atrial fibrillation: Secondary | ICD-10-CM | POA: Diagnosis present

## 2024-11-10 DIAGNOSIS — J1001 Influenza due to other identified influenza virus with the same other identified influenza virus pneumonia: Secondary | ICD-10-CM | POA: Diagnosis present

## 2024-11-10 DIAGNOSIS — I161 Hypertensive emergency: Secondary | ICD-10-CM | POA: Diagnosis present

## 2024-11-10 DIAGNOSIS — I110 Hypertensive heart disease with heart failure: Secondary | ICD-10-CM | POA: Diagnosis present

## 2024-11-10 DIAGNOSIS — I5033 Acute on chronic diastolic (congestive) heart failure: Secondary | ICD-10-CM | POA: Diagnosis present

## 2024-11-10 DIAGNOSIS — T380X5A Adverse effect of glucocorticoids and synthetic analogues, initial encounter: Secondary | ICD-10-CM | POA: Diagnosis present

## 2024-11-10 DIAGNOSIS — Z1152 Encounter for screening for COVID-19: Secondary | ICD-10-CM | POA: Diagnosis not present

## 2024-11-10 DIAGNOSIS — Z880 Allergy status to penicillin: Secondary | ICD-10-CM | POA: Diagnosis not present

## 2024-11-10 DIAGNOSIS — A4189 Other specified sepsis: Secondary | ICD-10-CM | POA: Diagnosis present

## 2024-11-10 DIAGNOSIS — E785 Hyperlipidemia, unspecified: Secondary | ICD-10-CM | POA: Diagnosis present

## 2024-11-10 DIAGNOSIS — Z8673 Personal history of transient ischemic attack (TIA), and cerebral infarction without residual deficits: Secondary | ICD-10-CM | POA: Diagnosis not present

## 2024-11-10 DIAGNOSIS — F32A Depression, unspecified: Secondary | ICD-10-CM | POA: Diagnosis present

## 2024-11-10 DIAGNOSIS — H409 Unspecified glaucoma: Secondary | ICD-10-CM | POA: Diagnosis present

## 2024-11-10 DIAGNOSIS — J9601 Acute respiratory failure with hypoxia: Secondary | ICD-10-CM | POA: Diagnosis present

## 2024-11-10 LAB — GLUCOSE, CAPILLARY
Glucose-Capillary: 104 mg/dL — ABNORMAL HIGH (ref 70–99)
Glucose-Capillary: 117 mg/dL — ABNORMAL HIGH (ref 70–99)

## 2024-11-10 LAB — ECHOCARDIOGRAM COMPLETE
Area-P 1/2: 4.31 cm2
Calc EF: 55.4 %
Height: 63 in
S' Lateral: 2.5 cm
Single Plane A2C EF: 58 %
Single Plane A4C EF: 50.9 %
Weight: 2032 [oz_av]

## 2024-11-10 LAB — CBC
HCT: 36.3 % (ref 36.0–46.0)
Hemoglobin: 13 g/dL (ref 12.0–15.0)
MCH: 31.9 pg (ref 26.0–34.0)
MCHC: 35.8 g/dL (ref 30.0–36.0)
MCV: 89.2 fL (ref 80.0–100.0)
Platelets: 103 K/uL — ABNORMAL LOW (ref 150–400)
RBC: 4.07 MIL/uL (ref 3.87–5.11)
RDW: 13.1 % (ref 11.5–15.5)
WBC: 11.3 K/uL — ABNORMAL HIGH (ref 4.0–10.5)
nRBC: 0 % (ref 0.0–0.2)

## 2024-11-10 LAB — COMPREHENSIVE METABOLIC PANEL WITH GFR
ALT: 40 U/L (ref 0–44)
AST: 48 U/L — ABNORMAL HIGH (ref 15–41)
Albumin: 4 g/dL (ref 3.5–5.0)
Alkaline Phosphatase: 53 U/L (ref 38–126)
Anion gap: 12 (ref 5–15)
BUN: 13 mg/dL (ref 8–23)
CO2: 25 mmol/L (ref 22–32)
Calcium: 8.6 mg/dL — ABNORMAL LOW (ref 8.9–10.3)
Chloride: 99 mmol/L (ref 98–111)
Creatinine, Ser: 0.88 mg/dL (ref 0.44–1.00)
GFR, Estimated: >60 mL/min
Glucose, Bld: 141 mg/dL — ABNORMAL HIGH (ref 70–99)
Potassium: 3.6 mmol/L (ref 3.5–5.1)
Sodium: 136 mmol/L (ref 135–145)
Total Bilirubin: 0.5 mg/dL (ref 0.0–1.2)
Total Protein: 6.5 g/dL (ref 6.5–8.1)

## 2024-11-10 LAB — TROPONIN T, HIGH SENSITIVITY
Troponin T High Sensitivity: 85 ng/L — ABNORMAL HIGH (ref 0–19)
Troponin T High Sensitivity: 183 ng/L (ref 0–19)

## 2024-11-10 LAB — LACTIC ACID, PLASMA: Lactic Acid, Venous: 1.5 mmol/L (ref 0.5–1.9)

## 2024-11-10 LAB — CBG MONITORING, ED
Glucose-Capillary: 137 mg/dL — ABNORMAL HIGH (ref 70–99)
Glucose-Capillary: 135 mg/dL — ABNORMAL HIGH (ref 70–99)

## 2024-11-10 LAB — PROCALCITONIN: Procalcitonin: 6.96 ng/mL

## 2024-11-10 MED ORDER — ONDANSETRON HCL 4 MG/2ML IJ SOLN
INTRAMUSCULAR | Status: AC
  Filled 2024-11-10: qty 2

## 2024-11-10 MED ORDER — SODIUM CHLORIDE 0.9 % IV SOLN
1.0000 g | INTRAVENOUS | Status: DC
  Administered 2024-11-10 – 2024-11-12 (×3): 1 g via INTRAVENOUS
  Filled 2024-11-10: qty 10

## 2024-11-10 MED ORDER — POTASSIUM CHLORIDE CRYS ER 20 MEQ PO TBCR
20.0000 meq | EXTENDED_RELEASE_TABLET | Freq: Once | ORAL | Status: AC
  Administered 2024-11-10: 20 meq via ORAL
  Filled 2024-11-10: qty 1

## 2024-11-10 MED ORDER — FINERENONE 20 MG PO TABS: 20.0000 mg | ORAL_TABLET | Freq: Every day | ORAL | Status: DC

## 2024-11-10 MED ORDER — FUROSEMIDE 10 MG/ML IJ SOLN
40.0000 mg | INTRAMUSCULAR | Status: AC
  Administered 2024-11-10: 40 mg via INTRAVENOUS
  Filled 2024-11-10: qty 4

## 2024-11-10 MED ORDER — METHOCARBAMOL 1000 MG/10ML IJ SOLN
500.0000 mg | Freq: Once | INTRAMUSCULAR | Status: AC
  Administered 2024-11-10: 500 mg via INTRAVENOUS
  Filled 2024-11-10: qty 10

## 2024-11-10 MED ORDER — ENOXAPARIN SODIUM 40 MG/0.4ML IJ SOSY
40.0000 mg | PREFILLED_SYRINGE | INTRAMUSCULAR | Status: DC
  Administered 2024-11-11 – 2024-11-12 (×2): 40 mg via SUBCUTANEOUS

## 2024-11-10 MED ORDER — ALBUTEROL SULFATE (2.5 MG/3ML) 0.083% IN NEBU
2.5000 mg | INHALATION_SOLUTION | RESPIRATORY_TRACT | Status: DC | PRN
  Administered 2024-11-11: 2.5 mg via RESPIRATORY_TRACT

## 2024-11-10 MED ORDER — SACUBITRIL-VALSARTAN 24-26 MG PO TABS
1.0000 | ORAL_TABLET | Freq: Two times a day (BID) | ORAL | Status: DC
  Administered 2024-11-10 – 2024-11-12 (×6): 1 via ORAL
  Filled 2024-11-10 (×2): qty 1

## 2024-11-10 MED ORDER — ONDANSETRON HCL 4 MG/2ML IJ SOLN
4.0000 mg | Freq: Once | INTRAMUSCULAR | Status: AC
  Administered 2024-11-10: 4 mg via INTRAVENOUS

## 2024-11-10 MED ORDER — ENOXAPARIN SODIUM 30 MG/0.3ML IJ SOSY
30.0000 mg | PREFILLED_SYRINGE | INTRAMUSCULAR | Status: DC
  Administered 2024-11-10: 30 mg via SUBCUTANEOUS
  Filled 2024-11-10: qty 0.3

## 2024-11-10 MED ORDER — SODIUM CHLORIDE 0.9 % IV SOLN
500.0000 mg | INTRAVENOUS | Status: DC
  Administered 2024-11-10 – 2024-11-12 (×3): 500 mg via INTRAVENOUS
  Filled 2024-11-10 (×2): qty 5

## 2024-11-10 MED ORDER — ASPIRIN 81 MG PO TBEC
81.0000 mg | DELAYED_RELEASE_TABLET | Freq: Every day | ORAL | Status: DC
  Administered 2024-11-10 – 2024-11-13 (×4): 81 mg via ORAL
  Filled 2024-11-10: qty 1

## 2024-11-10 MED ORDER — ACETAMINOPHEN 500 MG PO TABS: 500.0000 mg | ORAL_TABLET | Freq: Four times a day (QID) | ORAL | Status: DC | PRN

## 2024-11-10 MED ORDER — LATANOPROST 0.005 % OP SOLN
1.0000 [drp] | Freq: Every day | OPHTHALMIC | Status: DC
  Administered 2024-11-11 – 2024-11-12 (×2): 1 [drp] via OPHTHALMIC
  Filled 2024-11-10: qty 2.5

## 2024-11-10 MED ORDER — FUROSEMIDE 10 MG/ML IJ SOLN
40.0000 mg | Freq: Once | INTRAMUSCULAR | Status: AC
  Administered 2024-11-10: 40 mg via INTRAVENOUS
  Filled 2024-11-10: qty 4

## 2024-11-10 MED ORDER — MORPHINE SULFATE (PF) 2 MG/ML IV SOLN: 1.0000 mg | INTRAVENOUS | Status: DC | PRN

## 2024-11-10 MED ORDER — EZETIMIBE 10 MG PO TABS
10.0000 mg | ORAL_TABLET | Freq: Every day | ORAL | Status: DC
  Administered 2024-11-10 – 2024-11-13 (×4): 10 mg via ORAL
  Filled 2024-11-10: qty 1

## 2024-11-10 MED ORDER — IPRATROPIUM-ALBUTEROL 0.5-2.5 (3) MG/3ML IN SOLN
3.0000 mL | Freq: Four times a day (QID) | RESPIRATORY_TRACT | Status: DC
  Administered 2024-11-10 – 2024-11-11 (×4): 3 mL via RESPIRATORY_TRACT
  Filled 2024-11-10 (×2): qty 3

## 2024-11-10 MED ORDER — POLYETHYLENE GLYCOL 3350 17 G PO PACK
17.0000 g | PACK | Freq: Every day | ORAL | Status: DC | PRN
  Administered 2024-11-12: 17 g via ORAL

## 2024-11-10 MED ORDER — INSULIN ASPART 100 UNIT/ML IJ SOLN
0.0000 [IU] | Freq: Three times a day (TID) | INTRAMUSCULAR | Status: DC
  Administered 2024-11-10 (×2): 1 [IU] via SUBCUTANEOUS
  Filled 2024-11-10 (×2): qty 1

## 2024-11-10 MED ORDER — ACETAMINOPHEN 10 MG/ML IV SOLN
1000.0000 mg | Freq: Four times a day (QID) | INTRAVENOUS | Status: DC
  Administered 2024-11-10: 1000 mg via INTRAVENOUS
  Filled 2024-11-10: qty 100

## 2024-11-10 MED ORDER — INSULIN ASPART 100 UNIT/ML IJ SOLN: 0.0000 [IU] | Freq: Every day | INTRAMUSCULAR | Status: DC

## 2024-11-10 MED ORDER — ONDANSETRON HCL 4 MG/2ML IJ SOLN
INTRAMUSCULAR | Status: AC
  Administered 2024-11-10: 4 mg via INTRAVENOUS
  Filled 2024-11-10: qty 2

## 2024-11-10 MED ORDER — OSELTAMIVIR PHOSPHATE 30 MG PO CAPS
30.0000 mg | ORAL_CAPSULE | Freq: Two times a day (BID) | ORAL | Status: DC
  Administered 2024-11-10 – 2024-11-12 (×5): 30 mg via ORAL
  Filled 2024-11-10 (×3): qty 1

## 2024-11-10 MED ORDER — OSELTAMIVIR PHOSPHATE 75 MG PO CAPS
75.0000 mg | ORAL_CAPSULE | Freq: Once | ORAL | Status: AC
  Administered 2024-11-10: 75 mg via ORAL
  Filled 2024-11-10: qty 1

## 2024-11-10 MED ORDER — IOHEXOL 350 MG/ML SOLN
75.0000 mL | Freq: Once | INTRAVENOUS | Status: AC | PRN
  Administered 2024-11-10: 75 mL via INTRAVENOUS

## 2024-11-10 MED ORDER — SODIUM CHLORIDE 0.9 % IV SOLN: INTRAVENOUS | Status: AC | PRN

## 2024-11-10 NOTE — H&P (Signed)
 " History and Physical    Brooke Gray FMW:[id number] DOB: [birth year] DOA: 11/09/2024  PCP: Berneta Elsie Sayre, MD  Patient coming from: Home  Chief Complaint: Shortness of breath  HPI: Brooke Gray is a 78 y.o. female with medical history significant of HFpEF, paroxysmal A-fib, renal stenosis status post stenting of right renal artery, hypertension, SVT, hyperlipidemia, anemia, glaucoma presenting with a chief complaint of shortness of breath.  Patient is reporting shortness of breath, orthopnea, cough, and generalized weakness since yesterday.  Shortness of breath was sudden onset yesterday evening.  Denies fevers, chills, or chest pain.  States her cardiologist recently stopped Jardiance  as she was having yeast infections.  She was started on finerenone  20 mg daily.  Also takes Entresto  at home and has been compliant with her medications.  Denies lower extremity edema.  Sister at bedside states patient might have had increased dietary sodium intake recently due to the holidays.  Patient is reporting recent mild weight gain.  She had an episode of vomiting in the ED but was not vomiting at home and no longer feels nauseous.  Denies abdominal pain or diarrhea.  Reporting some mild constipation but otherwise having daily bowel movements.  ED Course: SpO2 76% on room air and was wheezing with EMS and placed on supplemental oxygen.  Patient was given DuoNeb x 2, IV mag 2 g, Solu-Medrol 125 mg, and IM epinephrine 0.5 mg prior to arrival.  Blood pressure 240/140.  On arrival to the ED, patient was tachycardic and extremely tachypneic.  She was placed on BiPAP due to respiratory distress.  Febrile in the ED with temperature 101.5 F.  Labs notable for WBC count 11.6, glucose 221, AST 59, ALT 49, alk phos and T. bili normal, troponin 48> 85, proBNP 1400, influenza A PCR positive.  EKG showing sinus tachycardia and no STEMI.  CTA chest showing: IMPRESSION: 1. No evidence of pulmonary embolism. 2.  Findings consistent with CHF or fluid overload with asymmetry, including posterior dense consolidation in the right greater than left lower lobes, which could represent airspace edema or a combination of airspace edema and pneumonia. 3. Mild to moderate cardiomegaly with left chamber predominance, central venous distention, and septal hypertrophy, with IVC contrast reflux and pooling in the IVC and right hepatic veins consistent with slow flow such as with right heart dysfunction ( impending cardiac arrest and shock may also be associated with this finding ). 4. Unchanged 6 mm subpleural right upper lobe groundglass nodule, recommend. CT every 2 years until 5 years, as per Fleischner Society Guidelines. 5. Moderate pulmonary emphysema is an independent risk factor for lung cancer, recommend consideration for evaluation for a low-dose CT lung cancer screening program.  Medications administered in the ED include nitroglycerin  ointment, IV Lasix  40 mg, Robaxin , Tamiflu , Zofran , Tylenol , ceftriaxone , azithromycin , and albuterol  neb.    Cardiology consulted.  Review of Systems:  Review of Systems  All other systems reviewed and are negative.   Past Medical History:  Diagnosis Date   A-fib [facility])    Cardiomegaly    Glaucoma    Hypertension     Past Surgical History:  Procedure Laterality Date   BREAST BIOPSY Right 04/17/2023   MM RT BREAST BX W LOC DEV 1ST LESION IMAGE BX SPEC STEREO GUIDE 04/17/2023 [facility]   no past surgery     RENAL ANGIOGRAPHY N/A 12/28/2022   Procedure: RENAL ANGIOGRAPHY;  Surgeon: Darron Deatrice LABOR, MD;  Location: [facility] INVASIVE CV LAB;  Service: Cardiovascular;  Laterality:  N/A;   RENAL INTERVENTION  12/28/2022   Procedure: RENAL INTERVENTION;  Surgeon: Darron Deatrice LABOR, MD;  Location: [facility] INVASIVE CV LAB;  Service: Cardiovascular;;  right renal     reports that she has quit smoking. Her smoking use included cigarettes. She has a 7.5 pack-year smoking  history. She has never used smokeless tobacco. She reports current alcohol use of about 1.0 - 2.0 standard drink of alcohol per week. She reports that she does not use drugs.  Allergies[1]  Family History  Problem Relation Age of Onset   Allergic rhinitis Son    Angioedema Neg Hx    Asthma Neg Hx    Eczema Neg Hx    Immunodeficiency Neg Hx    Urticaria Neg Hx     Prior to Admission medications  Medication Sig Start Date End Date Taking? Authorizing Provider  acetaminophen  (TYLENOL ) 500 MG tablet Take 500 mg by mouth every 6 (six) hours as needed for mild pain (pain score 1-3), headache or fever.    [provider]  Ascorbic Acid (VITAMIN C) 1000 MG tablet Take 1,000 mg by mouth daily.    [provider]  aspirin  EC 81 MG tablet Take 1 tablet (81 mg total) by mouth daily. Swallow whole. 12/13/22   Arida, Muhammad A, MD  Cholecalciferol (VITAMIN D3) 125 MCG (5000 UT) TABS Take 5,000 mcg by mouth daily. Patient not taking: Reported on 10/31/2024    [provider]  ezetimibe  (ZETIA ) 10 MG tablet Take 1 tablet (10 mg total) by mouth daily. 01/16/24 04/23/25  Darron Deatrice LABOR, MD  ferrous sulfate  325 (65 FE) MG EC tablet Take 325 mg by mouth daily as needed (cold hands).    [provider]  Finerenone  (KERENDIA ) 20 MG TABS Take 1 tablet (20 mg total) by mouth daily. 10/31/24   Lee, Jordan, NP  fluticasone  (FLONASE ) 50 MCG/ACT nasal spray Place 2 sprays into both nostrils daily as needed for allergies or rhinitis.    [provider]  latanoprost  (XALATAN ) 0.005 % ophthalmic solution Place 1 drop into both eyes at bedtime.  03/30/19   [provider]  sacubitril -valsartan  (ENTRESTO ) 24-26 MG Take 1 tablet by mouth twice daily 01/16/24   Gardenia Led, DO    Physical Exam: Vitals:   11/10/24 0145 11/10/24 0245 11/10/24 0300 11/10/24 0351  BP: (!) 236/84 (!) 145/89 133/86   Pulse: 91 91 85   Resp: (!) 30 (!) 33 (!) 21   Temp: 99 F (37.2  C)   (!) 101.5 F (38.6 C)  TempSrc:    Oral  SpO2: 99% 100% 100%   Weight:      Height:        Physical Exam Vitals reviewed.  Constitutional:      General: She is not in acute distress. HENT:     Head: Normocephalic and atraumatic.  Eyes:     Extraocular Movements: Extraocular movements intact.  Neck:     Comments: +JVD Cardiovascular:     Rate and Rhythm: Normal rate and regular rhythm.     Pulses: Normal pulses.  Pulmonary:     Effort: Pulmonary effort is normal. No respiratory distress.     Breath sounds: No wheezing or rales.  Abdominal:     General: Bowel sounds are normal. There is no distension.     Palpations: Abdomen is soft.     Tenderness: There is no abdominal tenderness. There is no guarding.  Musculoskeletal:     Cervical back: Normal  range of motion.     Right lower leg: No edema.     Left lower leg: No edema.  Skin:    General: Skin is warm and dry.  Neurological:     General: No focal deficit present.     Mental Status: She is alert and oriented to person, place, and time.     Labs on Admission: I have personally reviewed following labs and imaging studies  CBC: Recent Labs  Lab 11/09/24 2103  WBC 11.6*  HGB 14.9  HCT 43.3  MCV 91.9  PLT 162   Basic Metabolic Panel: Recent Labs  Lab 11/09/24 2103  NA 137  K 4.0  CL 102  CO2 23  GLUCOSE 221*  BUN 14  CREATININE 0.77  CALCIUM  9.1   GFR: Estimated Creatinine Clearance: 49.5 mL/min (by C-G formula based on SCr of 0.77 mg/dL). Liver Function Tests: Recent Labs  Lab 11/09/24 2103  AST 59*  ALT 49*  ALKPHOS 83  BILITOT 0.4  PROT 7.5  ALBUMIN 4.4   No results for input(s): LIPASE, AMYLASE in the last 168 hours. No results for input(s): AMMONIA in the last 168 hours. Coagulation Profile: Recent Labs  Lab 11/09/24 2103  INR 1.1   Cardiac Enzymes: No results for input(s): CKTOTAL, CKMB, CKMBINDEX, TROPONINI in the last 168 hours. BNP (last 3  results) Recent Labs    11/09/24 2103  PROBNP 1,400.0*   HbA1C: No results for input(s): HGBA1C in the last 72 hours. CBG: No results for input(s): GLUCAP in the last 168 hours. Lipid Profile: No results for input(s): CHOL, HDL, LDLCALC, TRIG, CHOLHDL, LDLDIRECT in the last 72 hours. Thyroid  Function Tests: No results for input(s): TSH, T4TOTAL, FREET4, T3FREE, THYROIDAB in the last 72 hours. Anemia Panel: No results for input(s): VITAMINB12, FOLATE, FERRITIN, TIBC, IRON, RETICCTPCT in the last 72 hours. Urine analysis:    Component Value Date/Time   COLORURINE YELLOW 01/24/2023 1522   APPEARANCEUR CLEAR 01/24/2023 1522   LABSPEC 1.015 01/24/2023 1522   PHURINE 6.0 01/24/2023 1522   GLUCOSEU NEGATIVE 01/24/2023 1522   HGBUR NEGATIVE 01/24/2023 1522   BILIRUBINUR NEGATIVE 01/24/2023 1522   KETONESUR NEGATIVE 01/24/2023 1522   PROTEINUR NEGATIVE 05/30/2020 2320   UROBILINOGEN 0.2 01/24/2023 1522   NITRITE NEGATIVE 01/24/2023 1522   LEUKOCYTESUR NEGATIVE 01/24/2023 1522    Radiological Exams on Admission: CT Angio Chest PE W and/or Wo Contrast Result Date: 11/10/2024 EXAM: CTA CHEST 11/10/2024 12:14:27 AM TECHNIQUE: CTA of the chest was performed without and with the administration of 75 mL of iohexol  (OMNIPAQUE ) 350 MG/ML injection. Multiplanar reformatted images are provided for review. MIP images are provided for review. Automated exposure control, iterative reconstruction, and/or weight based adjustment of the mA/kV was utilized to reduce the radiation dose to as low as reasonably achievable. COMPARISON: Portable chest yesterday, portable chest 12/07/2022, and CTA chest 11/14/2022. CLINICAL HISTORY: Pulmonary embolism (PE) suspected, high prob. FINDINGS: PULMONARY ARTERIES: Pulmonary arteries are adequately opacified for evaluation. The pulmonary arteries are upper normal in caliber. No evidence of arterial embolism. MEDIASTINUM: There is  mild to moderate cardiomegaly with left chamber predominance and septal hypertrophy. Central venous distention is noted. There is no pericardial effusion. No visible coronary calcification. There is aortic atherosclerosis and tortuosity without aneurysm or dissection. There is atherosclerosis in the great vessels with normal variant 2-vessel aortic arch with a brachiobicarotid trunk. There is IVC contrast reflux with pooling or settling of contrast in the IVC and right hepatic veins consistent with slow  flow, such as with right heart dysfunction. The finding of contrast pooling could also be seen with impending cardiac arrest and shock. LYMPH NODES: No mediastinal, hilar or axillary lymphadenopathy. LUNGS AND PLEURA: There are symmetric small layering pleural effusions. Moderate emphysematous disease with centrilobular change predominating and paraseptal change in the upper apices. Posterior interstitial and airspace opacities are noted of the right upper and both lower lobes, in the lower lobes greater on the right. There is posterior dense consolidation in the right greater than left lower lobes, which could all be due to airspace edema or could be a combination of airspace edema and pneumonia. Overall findings are favored to represent CHF or fluid overload with asymmetry. Calcified granulomas are again noted in the right upper lobe. A 6 mm subpleural right upper lobe ground glass nodule is unchanged on axial 48 of series 7. Follow-up CT recommended every 2 years until 5 years of stability is established. The lungs are otherwise clear. No pneumothorax. UPPER ABDOMEN: Limited images of the upper abdomen are unremarkable. SOFT TISSUES AND BONES: There is degenerative change and mild kyphodextroscoliosis of the thoracic spine. No acute or significant osseous findings. No acute soft tissue abnormality. IMPRESSION: 1. No evidence of pulmonary embolism. 2. Findings consistent with CHF or fluid overload with asymmetry,  including posterior dense consolidation in the right greater than left lower lobes, which could represent airspace edema or a combination of airspace edema and pneumonia. 3. Mild to moderate cardiomegaly with left chamber predominance, central venous distention, and septal hypertrophy, with IVC contrast reflux and pooling in the IVC and right hepatic veins consistent with slow flow such as with right heart dysfunction ( impending cardiac arrest and shock may also be associated with this finding ). 4. Unchanged 6 mm subpleural right upper lobe groundglass nodule, recommend. CT every 2 years until 5 years, as per Fleischner Society Guidelines. 5. Moderate pulmonary emphysema is an independent risk factor for lung cancer, recommend consideration for evaluation for a low-dose CT lung cancer screening program. Electronically signed by: Francis Quam MD 11/10/2024 01:15 AM EST RP Workstation: [id number]   DG Chest Port 1 View Result Date: 11/09/2024 EXAM: 1 VIEW(S) XRAY OF THE CHEST 11/09/2024 09:19:00 PM COMPARISON: None available. CLINICAL HISTORY: sob FINDINGS: LUNGS AND PLEURA: Moderate interstitial and alveolar pulmonary edema, with asymmetric infiltrate at the right lung base likely reflecting asymmetric pulmonary edema. Superimposed pneumonic consolidation, however, is difficult to exclude. This appears similar to prior examination of 11/07/2023, favoring recurrent edema, however. Superimposed small right pleural effusion. No pneumothorax. HEART AND MEDIASTINUM: Heart size at upper limits. Aortic atherosclerosis. BONES AND SOFT TISSUES: No acute osseous abnormality. IMPRESSION: 1. Moderate interstitial and alveolar pulmonary edema with asymmetric right basilar opacity, likely asymmetric pulmonary edema; superimposed pneumonic consolidation is difficult to exclude. 2. Small right pleural effusion. 3. Heart size at the upper limits of normal. 4. Aortic atherosclerosis. Electronically signed by: Dorethia Molt MD  11/09/2024 10:29 PM EST RP Workstation: [id number]    Assessment and Plan  Acute on chronic HFpEF Hypertensive emergency with flash pulmonary edema Patient presenting with sudden onset shortness of breath with elevated pro BNP and pulmonary edema on imaging in the setting of elevated blood pressure (initially 240/140 with EMS and recorded as 188/147 on arrival to the ED).  Last echo done in June 2025 showing EF 70 to 75%, grade 1 diastolic dysfunction, moderate LVH with severe asymmetric septal hypertrophy suggestive of hypertrophic cardiomyopathy, RV systolic function normal, left atrium moderately dilated, mild mitral regurgitation.  I have seen the patient with cardiology at bedside.  Cardiology  feels that the overall clinical picture is consistent with flash pulmonary edema.  She was initially placed on BiPAP and was given IV Lasix  40 mg and nitroglycerin  paste in the ED with improvement of her respiratory status, currently saturating well on 8 L HFNC.  SBP now down to 130-140s.  Cardiology ordered another dose of IV Lasix  40 mg and recommended repeating echocardiogram in the morning.  Continue home Entresto  and finerenone .  Jardiance  was previously stopped due to concern for vaginal yeast infection.  Monitor intake and output, daily weights.  Dietary sodium and fluid restriction.  Sepsis secondary to Influenza A with possible pneumonia CT showing findings concerning for pulmonary edema +/pneumonia.  Influenza A PCR positive.  Met SIRS criteria at the time of presentation with fever, tachycardia, and tachypnea.  Vital signs now improved.  Bacterial pneumonia less likely given no significant leukocytosis on initial labs but will continue antibiotics for now as she did have an episode of vomiting in the ED while on BiPAP and it was subsequently removed.  Check procalcitonin level and trend WBC count.  Continue 5-day course of Tamiflu .  Droplet and contact precautions.  Blood cultures and lactate  ordered.  Acute hypoxemic respiratory failure In the setting of problems listed above.  Hypoxic to the mid 70s on room air and was in respiratory distress requiring BiPAP initially.  Now improved and currently saturating well on 8 L HFNC.  Continue supplemental oxygen, wean as tolerated.  Elevated troponin Likely due to demand ischemia.  Patient is not endorsing chest pain and cardiology is not concerned about ACS at this time.  Trend troponin.  Emesis Mild transaminitis In the setting of influenza A infection.  Patient had an episode of emesis in the ED but no longer vomiting now.  Alk phos and T. bili normal.  Reports mild constipation but otherwise having daily bowel movements.  No abdominal pain reported.  Bowel obstruction less likely to be the reason for emesis.  Abdominal exam benign.  Monitor LFTs.  MiraLAX  PRN mild constipation.  Steroid-induced hyperglycemia Received Solu-Medrol by EMS.  No history of diabetes and last hemoglobin A1c 5.6 in July 2025.  Continue CBG checks ACHS for now/sensitive sliding scale insulin  as needed.  Carb modified diet.  Paroxysmal A-fib Not on anticoagulation.  Hyperlipidemia Continue Zetia .  Glaucoma Continue Xalatan  eyedrops.  Incidental CT findings Unchanged 6 mm subpleural right upper lobe groundglass nodule, recommend. CT every 2 years until 5 years, as per Fleischner Society Guidelines. Moderate pulmonary emphysema is an independent risk factor for lung cancer, recommend consideration for evaluation for a low-dose CT lung cancer screening program. Will need outpatient follow-up for repeat imaging.  DVT prophylaxis: Lovenox  Code Status: Full Code (discussed with the patient) Family Communication: Sister at bedside. Consults called: Cardiology Level of care: Progressive Care Unit Admission status: It is my clinical opinion that admission to INPATIENT is reasonable and necessary because of the expectation that this patient will require  hospital care that crosses at least 2 midnights to treat this condition based on the medical complexity of the problems presented.  Given the aforementioned information, the predictability of an adverse outcome is felt to be significant.  Editha Ram MD [facility]  If 7PM-7AM, please contact night-coverage [email or web address]  11/10/2024, 5:36 AM       [1]  Allergies Allergen Reactions   Spironolactone  Other (See Comments)    Visual disturbance.    Penicillins Hives   "

## 2024-11-10 NOTE — Progress Notes (Signed)
2D echo complete 

## 2024-11-10 NOTE — ED Notes (Signed)
 Placed pt on bedpan to void w/o issue. Per PA, called respiratory to request pt be placed on bipap again. Family at bedside.

## 2024-11-10 NOTE — ED Notes (Signed)
 Troponin 85, PA Upstill aware

## 2024-11-10 NOTE — Progress Notes (Signed)
 Patient admitted after midnight.  Please see H&P by Dr. Alfornia. Here with:  A/P:  Acute on chronic HFpEF Hypertensive emergency with flash pulmonary edema Patient presenting with sudden onset shortness of breath with elevated pro BNP and pulmonary edema on imaging in the setting of elevated blood pressure (initially 240/140 with EMS and recorded as 188/147 on arrival to the ED).  Last echo done in June 2025 showing EF 70 to 75%, grade 1 diastolic dysfunction, moderate LVH with severe asymmetric septal hypertrophy suggestive of hypertrophic cardiomyopathy, RV systolic function normal, left atrium moderately dilated, mild mitral regurgitation. I have seen the patient with cardiology at bedside.  Cardiology  feels that the overall clinical picture is consistent with flash pulmonary edema.  She was initially placed on BiPAP and was given IV Lasix  40 mg and nitroglycerin  paste in the ED with improvement of her respiratory status, currently saturating well on 8 L HFNC.  SBP now down to 130-140s.   -Cardiology ordered another dose of IV Lasix  40 mg and recommended repeating echocardiogram in the morning.   -Continue home Entresto   - Jardiance  was previously stopped due to concern for vaginal yeast infection.   -Monitor intake and output, daily weights   Sepsis secondary to Influenza A with possible pneumonia CT showing findings concerning for pulmonary edema +/pneumonia.  Influenza A PCR positive.  Met SIRS criteria at the time of presentation with fever, tachycardia, and tachypnea.  - continue antibiotics for now -Procalcitonin elevated Started on Tamiflu  as well   Acute hypoxemic respiratory failure In the setting of problems listed above.  Hypoxic to the mid 70s on room air and was in respiratory distress requiring BiPAP initially.   -Now improved and currently saturating well on 5 L HFNC-- wean as able   Elevated troponin Likely due to demand ischemia.  Patient is not endorsing chest pain and  cardiology is not concerned about ACS at this time.  Trend troponin.   Emesis Mild transaminitis -resolved   Steroid-induced hyperglycemia Received Solu-Medrol by EMS.  No history of diabetes and last hemoglobin A1c 5.6 in July 2025 -[facility]   Paroxysmal A-fib Not on anticoagulation. -from chart review: discovered on event monitor back in January 2021 and was reviewed by electrophysiology team at [facility]. In their note on 02/04/2020 it was stated that it was clear that she did have paroxysms of atrial fibrillation less than 30 minutes duration. Extensive discussion was held with her about the implications of this. It was recommended that she start Eliquis  5 mg twice a day and was given prescription for this before. Encouraged use at that time. She has not been on this medication. If atrial fibrillation returns, resume Eliquis     Hyperlipidemia Continue Zetia .   Glaucoma Continue Xalatan  eyedrops.   Incidental CT findings Unchanged 6 mm subpleural right upper lobe groundglass nodule, recommend. CT every 2 years until 5 years, as per Fleischner Society Guidelines. Moderate pulmonary emphysema is an independent risk factor for lung cancer, recommend consideration for evaluation for a low-dose CT lung cancer screening program. Will need outpatient follow-up for repeat imaging by PCP    Harlene Bowl DO

## 2024-11-10 NOTE — Consult Note (Addendum)
 "  Cardiology Consultation   Patient ID: Brooke Gray MRN: [id number]; DOB: January 29, 1946  Admit date: 11/09/2024 Date of Consult: 11/10/2024  PCP:  Berneta Elsie Sayre, MD   [facility] Providers Cardiologist:  None  PV Cardiologist:  Deatrice Cage, MD       Patient Profile: Brooke Gray is a 78 y.o. female with a hx of HFpEF, moderate LVH, TIA, HLD, and remote tobacco use who is being seen 11/10/2024 for the evaluation of HFpEF exacerbation at the request of the hospitalist.  History of Present Illness: Ms. Poinsett reports that she was in her normal self state until yesterday evening when she started to have sudden onset, shortness of breath associated with nonproductive cough, nausea, and vomiting.  No chest pain, leg swelling, PND, or orthopnea.  No fevers, chills, or rigors.  No sick contact.  Upon presentation, Tmax 101.5. BP was elevated up to 188/147 and in respiratory distress. She was placed on BiPAP. Troponin 48 =>85. proBNP 1400. FluA+. CTA chest showed pulmonary edema, central venous distention, septal hypertrophy, and IVC contrast reflux with pooling in the IVC and hepatin vein. EKG showed sinus tachycardia with LVH.  She was given Lasix  40 mg IV, and nitroglycerin  paste was placed.   Past Medical History:  Diagnosis Date   A-fib [facility])    Cardiomegaly    Glaucoma    Hypertension     Past Surgical History:  Procedure Laterality Date   BREAST BIOPSY Right 04/17/2023   MM RT BREAST BX W LOC DEV 1ST LESION IMAGE BX SPEC STEREO GUIDE 04/17/2023 [facility]   no past surgery     RENAL ANGIOGRAPHY N/A 12/28/2022   Procedure: RENAL ANGIOGRAPHY;  Surgeon: Cage Deatrice LABOR, MD;  Location: [facility] INVASIVE CV LAB;  Service: Cardiovascular;  Laterality: N/A;   RENAL INTERVENTION  12/28/2022   Procedure: RENAL INTERVENTION;  Surgeon: Cage Deatrice LABOR, MD;  Location: [facility] INVASIVE CV LAB;  Service: Cardiovascular;;  right renal     Home Medications:  Prior  to Admission medications  Medication Sig Start Date End Date Taking? Authorizing Provider  acetaminophen  (TYLENOL ) 500 MG tablet Take 500 mg by mouth every 6 (six) hours as needed for mild pain (pain score 1-3), headache or fever.    [provider]  Ascorbic Acid (VITAMIN C) 1000 MG tablet Take 1,000 mg by mouth daily.    [provider]  aspirin  EC 81 MG tablet Take 1 tablet (81 mg total) by mouth daily. Swallow whole. 12/13/22   Arida, Muhammad A, MD  Cholecalciferol (VITAMIN D3) 125 MCG (5000 UT) TABS Take 5,000 mcg by mouth daily. Patient not taking: Reported on 10/31/2024    [provider]  ezetimibe  (ZETIA ) 10 MG tablet Take 1 tablet (10 mg total) by mouth daily. 01/16/24 04/23/25  Cage Deatrice LABOR, MD  ferrous sulfate  325 (65 FE) MG EC tablet Take 325 mg by mouth daily as needed (cold hands).    [provider]  Finerenone  (KERENDIA ) 20 MG TABS Take 1 tablet (20 mg total) by mouth daily. 10/31/24   Lee, Jordan, NP  fluticasone  (FLONASE ) 50 MCG/ACT nasal spray Place 2 sprays into both nostrils daily as needed for allergies or rhinitis.    [provider]  latanoprost  (XALATAN ) 0.005 % ophthalmic solution Place 1 drop into both eyes at bedtime.  03/30/19   [provider]  sacubitril -valsartan  (ENTRESTO ) 24-26 MG Take 1 tablet by mouth twice daily 01/16/24   Sabharwal, Aditya, DO    Scheduled  Meds:  ondansetron        Continuous Infusions:  acetaminophen  1,000 mg (11/10/24 0521)   PRN Meds: albuterol , ondansetron   Allergies:   Allergies[1]  Social History:   Social History   Socioeconomic History   Marital status: Married    Spouse name: Not on file   Number of children: Not on file   Years of education: Not on file   Highest education level: Not on file  Occupational History   Not on file  Tobacco Use   Smoking status: Former    Current packs/day: 0.25    Average packs/day: 0.3 packs/day for 30.0 years (7.5 ttl pk-yrs)     Types: Cigarettes   Smokeless tobacco: Never  Vaping Use   Vaping status: Never Used  Substance and Sexual Activity   Alcohol use: Yes    Alcohol/week: 1.0 - 2.0 standard drink of alcohol    Types: 1 - 2 Glasses of wine per week    Comment: occasional   Drug use: No   Sexual activity: Not on file  Other Topics Concern   Not on file  Social History Narrative   Not on file   Social Drivers of Health   Tobacco Use: Medium Risk (11/09/2024)   Patient History    Smoking Tobacco Use: Former    Smokeless Tobacco Use: Never    Passive Exposure: Not on file  Financial Resource Strain: Low Risk (08/24/2023)   Overall Financial Resource Strain (CARDIA)    Difficulty of Paying Living Expenses: Not hard at all  Food Insecurity: No Food Insecurity (08/24/2023)   Hunger Vital Sign    Worried About Running Out of Food in the Last Year: Never true    Ran Out of Food in the Last Year: Never true  Transportation Needs: No Transportation Needs (08/24/2023)   PRAPARE - Administrator, Civil Service (Medical): No    Lack of Transportation (Non-Medical): No  Physical Activity: Insufficiently Active (08/24/2023)   Exercise Vital Sign    Days of Exercise per Week: 2 days    Minutes of Exercise per Session: 60 min  Stress: No Stress Concern Present (08/24/2023)   [employer] of Occupational Health - Occupational Stress Questionnaire    Feeling of Stress : Not at all  Social Connections: Unknown (08/24/2023)   Social Connection and Isolation Panel    Frequency of Communication with Friends and Family: More than three times a week    Frequency of Social Gatherings with Friends and Family: Twice a week    Attends Religious Services: Not on file    Active Member of Clubs or Organizations: Yes    Attends Banker Meetings: More than 4 times per year    Marital Status: Married  Catering Manager Violence: Not At Risk (08/28/2023)   Humiliation, Afraid, Rape, and Kick  questionnaire    Fear of Current or Ex-Partner: No    Emotionally Abused: No    Physically Abused: No    Sexually Abused: No  Depression (PHQ2-9): Low Risk (08/28/2023)   Depression (PHQ2-9)    PHQ-2 Score: 0  Alcohol Screen: Low Risk (08/24/2023)   Alcohol Screen    Last Alcohol Screening Score (AUDIT): 1  Housing: Low Risk (08/24/2023)   Housing    Last Housing Risk Score: 0  Utilities: Not At Risk (08/24/2023)   AHC Utilities    Threatened with loss of utilities: No  Health Literacy: Adequate Health Literacy (08/28/2023)   B1300 Health Literacy  Frequency of need for help with medical instructions: Never    Family History:   Family History  Problem Relation Age of Onset   Allergic rhinitis Son    Angioedema Neg Hx    Asthma Neg Hx    Eczema Neg Hx    Immunodeficiency Neg Hx    Urticaria Neg Hx      ROS:  Please see the history of present illness.  All other ROS reviewed and negative.     Physical Exam/Data: Vitals:   11/10/24 0245 11/10/24 0300 11/10/24 0351 11/10/24 0515  BP: (!) 145/89 133/86  (!) 145/125  Pulse: 91 85  89  Resp: (!) 33 (!) 21  (!) 28  Temp:   (!) 101.5 F (38.6 C)   TempSrc:   Oral   SpO2: 100% 100%  100%  Weight:      Height:        Intake/Output Summary (Last 24 hours) at 11/10/2024 0603 Last data filed at 11/09/2024 2253 Gross per 24 hour  Intake 335.74 ml  Output --  Net 335.74 ml      11/09/2024    8:50 PM 10/31/2024   10:03 AM 05/24/2024    3:09 PM  Last 3 Weights  Weight (lbs) 127 lb 127 lb 3.2 oz 129 lb 3.2 oz  Weight (kg) 57.607 kg 57.698 kg 58.605 kg     Body mass index is 22.5 kg/m.  General:  Well nourished, well developed, in no acute distress HEENT: normal Neck: mildly elevated JVD Vascular: Distal pulses 2+ bilaterally Cardiac:  normal S1, S2; RRR; no murmur  Lungs:  bilateral crackles Abd: soft, nontender Ext: no edema Musculoskeletal:  No deformities, BUE and BLE strength normal and equal Skin: warm  and dry  Neuro:  CNs 2-12 intact, no focal abnormalities noted Psych:  Normal affect   EKG:  The EKG was personally reviewed and demonstrates:  sinus tachycardia with LVH   Relevant CV Studies: TTE 04/2024:  1. Moderate LVH with severe assymetric septal hypertrophy suggestive of  hypertrophic cardiomyuopathy. No significant LVOT gradient at rest.  Valsalva not performed.. Left ventricular ejection fraction, by  estimation, is 70 to 75%. The left ventricle has  hyperdynamic function. The left ventricle has no regional wall motion  abnormalities. There is severe asymmetric left ventricular hypertrophy of  the septal segment. Left ventricular diastolic parameters are consistent  with Grade I diastolic dysfunction  (impaired relaxation).   2. Right ventricular systolic function is normal. The right ventricular  size is normal.   3. Left atrial size was moderately dilated.   4. SAM is present. The mitral valve is normal in structure. Mild mitral  valve regurgitation. No evidence of mitral stenosis.   5. The aortic valve is normal in structure. Aortic valve regurgitation is  trivial. No aortic stenosis is present.   6. The inferior vena cava is normal in size with greater than 50%  respiratory variability, suggesting right atrial pressure of 3 mmHg.   Laboratory Data: High Sensitivity Troponin:  No results for input(s): TROPONINIHS in the last 720 hours.  Recent Labs  Lab 11/09/24 2103 11/09/24 2301  TRNPT 48* 85*      Chemistry Recent Labs  Lab 11/09/24 2103  NA 137  K 4.0  CL 102  CO2 23  GLUCOSE 221*  BUN 14  CREATININE 0.77  CALCIUM  9.1  GFRNONAA >60  ANIONGAP 12    Recent Labs  Lab 11/09/24 2103  PROT 7.5  ALBUMIN 4.4  AST 59*  ALT 49*  ALKPHOS 83  BILITOT 0.4   Lipids No results for input(s): CHOL, TRIG, HDL, LABVLDL, LDLCALC, CHOLHDL in the last 168 hours.  Hematology Recent Labs  Lab 11/09/24 2103  WBC 11.6*  RBC 4.71  HGB 14.9  HCT  43.3  MCV 91.9  MCH 31.6  MCHC 34.4  RDW 12.9  PLT 162   Thyroid  No results for input(s): TSH, FREET4 in the last 168 hours.  BNP Recent Labs  Lab 11/09/24 2103  PROBNP 1,400.0*    DDimer No results for input(s): DDIMER in the last 168 hours.  Radiology/Studies:  CT Angio Chest PE W and/or Wo Contrast Result Date: 11/10/2024 EXAM: CTA CHEST 11/10/2024 12:14:27 AM TECHNIQUE: CTA of the chest was performed without and with the administration of 75 mL of iohexol  (OMNIPAQUE ) 350 MG/ML injection. Multiplanar reformatted images are provided for review. MIP images are provided for review. Automated exposure control, iterative reconstruction, and/or weight based adjustment of the mA/kV was utilized to reduce the radiation dose to as low as reasonably achievable. COMPARISON: Portable chest yesterday, portable chest 12/07/2022, and CTA chest 11/14/2022. CLINICAL HISTORY: Pulmonary embolism (PE) suspected, high prob. FINDINGS: PULMONARY ARTERIES: Pulmonary arteries are adequately opacified for evaluation. The pulmonary arteries are upper normal in caliber. No evidence of arterial embolism. MEDIASTINUM: There is mild to moderate cardiomegaly with left chamber predominance and septal hypertrophy. Central venous distention is noted. There is no pericardial effusion. No visible coronary calcification. There is aortic atherosclerosis and tortuosity without aneurysm or dissection. There is atherosclerosis in the great vessels with normal variant 2-vessel aortic arch with a brachiobicarotid trunk. There is IVC contrast reflux with pooling or settling of contrast in the IVC and right hepatic veins consistent with slow flow, such as with right heart dysfunction. The finding of contrast pooling could also be seen with impending cardiac arrest and shock. LYMPH NODES: No mediastinal, hilar or axillary lymphadenopathy. LUNGS AND PLEURA: There are symmetric small layering pleural effusions. Moderate emphysematous  disease with centrilobular change predominating and paraseptal change in the upper apices. Posterior interstitial and airspace opacities are noted of the right upper and both lower lobes, in the lower lobes greater on the right. There is posterior dense consolidation in the right greater than left lower lobes, which could all be due to airspace edema or could be a combination of airspace edema and pneumonia. Overall findings are favored to represent CHF or fluid overload with asymmetry. Calcified granulomas are again noted in the right upper lobe. A 6 mm subpleural right upper lobe ground glass nodule is unchanged on axial 48 of series 7. Follow-up CT recommended every 2 years until 5 years of stability is established. The lungs are otherwise clear. No pneumothorax. UPPER ABDOMEN: Limited images of the upper abdomen are unremarkable. SOFT TISSUES AND BONES: There is degenerative change and mild kyphodextroscoliosis of the thoracic spine. No acute or significant osseous findings. No acute soft tissue abnormality. IMPRESSION: 1. No evidence of pulmonary embolism. 2. Findings consistent with CHF or fluid overload with asymmetry, including posterior dense consolidation in the right greater than left lower lobes, which could represent airspace edema or a combination of airspace edema and pneumonia. 3. Mild to moderate cardiomegaly with left chamber predominance, central venous distention, and septal hypertrophy, with IVC contrast reflux and pooling in the IVC and right hepatic veins consistent with slow flow such as with right heart dysfunction ( impending cardiac arrest and shock may also be associated with this finding ).  4. Unchanged 6 mm subpleural right upper lobe groundglass nodule, recommend. CT every 2 years until 5 years, as per Fleischner Society Guidelines. 5. Moderate pulmonary emphysema is an independent risk factor for lung cancer, recommend consideration for evaluation for a low-dose CT lung cancer  screening program. Electronically signed by: Francis Quam MD 11/10/2024 01:15 AM EST RP Workstation: [id number]   DG Chest Port 1 View Result Date: 11/09/2024 EXAM: 1 VIEW(S) XRAY OF THE CHEST 11/09/2024 09:19:00 PM COMPARISON: None available. CLINICAL HISTORY: sob FINDINGS: LUNGS AND PLEURA: Moderate interstitial and alveolar pulmonary edema, with asymmetric infiltrate at the right lung base likely reflecting asymmetric pulmonary edema. Superimposed pneumonic consolidation, however, is difficult to exclude. This appears similar to prior examination of 11/07/2023, favoring recurrent edema, however. Superimposed small right pleural effusion. No pneumothorax. HEART AND MEDIASTINUM: Heart size at upper limits. Aortic atherosclerosis. BONES AND SOFT TISSUES: No acute osseous abnormality. IMPRESSION: 1. Moderate interstitial and alveolar pulmonary edema with asymmetric right basilar opacity, likely asymmetric pulmonary edema; superimposed pneumonic consolidation is difficult to exclude. 2. Small right pleural effusion. 3. Heart size at the upper limits of normal. 4. Aortic atherosclerosis. Electronically signed by: Dorethia Molt MD 11/09/2024 10:29 PM EST RP Workstation: [id number]     Assessment and Plan: Acute on chronic HFpEF exacerbation  Hypertensive emergency with flash pulmonary edema Presents with sudden onset SOB with pulmonary edema on CXR in the setting of elevated BP up to 188/147 in the setting of FluA+. Overall picture is consistent with flash pulmonary edema. She was initially placed on BiPAP and was given Lasix  40 mg IV and nitroglycerin  paste with significant improvement in her respiratory status and is now on 2L [place]. SBP now is down to 130s/140s.  - Redose Lasix  40 mg IV - Repeat TTE in the AM - Continue entresto  24-26 mg BID and finerenone  20 mg daily - Previously on jardiance  but was stopped due to yeast vaginal infection   3. Myocardial injury Troponin 48 =>85 represents myocardial  injury 2/2 above. No concern for ACS.  Risk Assessment/Risk Scores:       New [place]  Heart Association (NYHA) Functional Class NYHA Class II       For questions or updates, please contact [facility] Please consult [email or web address] for contact info under      Signed, Gillian CHRISTELLA Cass, MD  11/10/2024 6:03 AM      [1]  Allergies Allergen Reactions   Spironolactone  Other (See Comments)    Visual disturbance.    Penicillins Hives   "

## 2024-11-10 NOTE — Progress Notes (Signed)
 78 year old seen most recently in the advanced heart failure clinic on 10/31/2024 here with influenza A and acute on chronic diastolic heart failure exacerbation secondary to hypertensive emergency with flash pulmonary edema with myocardial injury troponin 48 up to 85 then 183.  Appears to have moderately increased work of breathing but she notes that this is better.  Husband at bedside.  Many questions answered.  Thin, no edema, regular rate and rhythm, systolic murmur, crackles lungs  proBNP 1400 Creatinine 0.88, potassium 3.6, lactic acid 1.5 normal Hemoglobin 13.0 white count 11.3 mildly elevated  CT scan no evidence of PE. EKG on admit showed sinus tachycardia 123 left axis deviation, LVH  Multiple [facility] notes reviewed to investigate prior knowledge of atrial fibrillation, advanced heart failure clinic reviewed.  Extensive data search.   Agree with consultation on 12/28 at 6 AM  Influenza A - Per primary team.  Tamiflu .  Ceftriaxone  IV azithromycin   Acute on chronic diastolic heart failure -Lasix  40 mg IV has been redosed. Echo pending, prior echo on 05/09/2024 showed moderate LVH with EF of 75%.  Severe septal hypertrophy with SAM.  Cardiac MRI 2024 showed variant of hypertrophic cardiomyopathy. Continue Entresto  24/26 twice daily, last blood pressure 111 systolic. Continue finerenone  20 mg daily Carvedilol  previously stopped at last visit secondary to bradycardia.  No Jardiance  secondary to vaginal yeast infection   Paroxysmal atrial fibrillation prior TIA history 2021 -Currently in sinus rhythm -Was discovered on event monitor back in January 2021 and was reviewed by electrophysiology team at [facility].  In their note on 02/04/2020 it was stated that it was clear that she did have paroxysms of atrial fibrillation less than 30 minutes duration.  Extensive discussion was held with her about the implications of this.  It was recommended that she start Eliquis  5 mg twice a day and was  given prescription for this before.  Encouraged use at that time.  She has not been on this medication.  If atrial fibrillation returns, resume Eliquis .  Renal artery stenosis - Stenting of right renal artery stable on 09/02/2024 vascular ultrasound.  Followed by Dr. Darron  Hyperlipidemia - Zetia  10 mg daily at home   CRITICAL CARE Performed by: Oneil Parchment   Total critical care time: 40 minutes  Critical care time was exclusive of separately billable procedures and treating other patients.  Critical care was necessary to treat or prevent imminent or life-threatening deterioration.  Critical care was time spent personally by me on the following activities: development of treatment plan with patient and/or surrogate as well as nursing, discussions with consultants, evaluation of patient's response to treatment, examination of patient, obtaining history from patient or surrogate, ordering and performing treatments and interventions, ordering and review of laboratory studies, ordering and review of radiographic studies, pulse oximetry and re-evaluation of patient's condition.

## 2024-11-10 NOTE — Progress Notes (Signed)
 PT placed on BIPAP at this time.   11/10/24 0145  BiPAP/CPAP/SIPAP  $ Non-Invasive Ventilator  Non-Invasive Vent Initial  $ Face Mask Small Yes  BiPAP/CPAP/SIPAP Pt Type Adult  BiPAP/CPAP/SIPAP SERVO  Mask Type Full face mask  Dentures removed? Not applicable  Mask Size Small  Set Rate 20 breaths/min  Respiratory Rate 23 breaths/min  IPAP 13 cmH20  EPAP 5 cmH2O  Pressure Support  (PC 8)  PEEP 5 cmH20  FiO2 (%) 60 %  Flow Rate 0 lpm  Minute Ventilation 11.7  Leak 28  Peak Inspiratory Pressure (PIP) 25  Tidal Volume (Vt) 686  Patient Home Machine No  Patient Home Mask No  Patient Home Tubing No  Auto Titrate No  Press High Alarm 30 cmH2O  Press Low Alarm 2 cmH2O  Nasal massage performed Yes  CPAP/SIPAP surface wiped down Yes  Device Plugged into RED Power Outlet Yes

## 2024-11-10 NOTE — ED Notes (Signed)
 Pt vomiting, bipap removed and zofran  administered

## 2024-11-11 DIAGNOSIS — I5033 Acute on chronic diastolic (congestive) heart failure: Secondary | ICD-10-CM | POA: Diagnosis not present

## 2024-11-11 LAB — GLUCOSE, CAPILLARY
Glucose-Capillary: 84 mg/dL (ref 70–99)
Glucose-Capillary: 101 mg/dL — ABNORMAL HIGH (ref 70–99)
Glucose-Capillary: 98 mg/dL (ref 70–99)
Glucose-Capillary: 133 mg/dL — ABNORMAL HIGH (ref 70–99)

## 2024-11-11 LAB — BASIC METABOLIC PANEL WITH GFR
Anion gap: 9 (ref 5–15)
BUN: 15 mg/dL (ref 8–23)
CO2: 28 mmol/L (ref 22–32)
Calcium: 8.3 mg/dL — ABNORMAL LOW (ref 8.9–10.3)
Chloride: 97 mmol/L — ABNORMAL LOW (ref 98–111)
Creatinine, Ser: 0.75 mg/dL (ref 0.44–1.00)
GFR, Estimated: >60 mL/min
Glucose, Bld: 98 mg/dL (ref 70–99)
Potassium: 3.6 mmol/L (ref 3.5–5.1)
Sodium: 135 mmol/L (ref 135–145)

## 2024-11-11 LAB — CBC
HCT: 35 % — ABNORMAL LOW (ref 36.0–46.0)
Hemoglobin: 12.6 g/dL (ref 12.0–15.0)
MCH: 31.9 pg (ref 26.0–34.0)
MCHC: 36 g/dL (ref 30.0–36.0)
MCV: 88.6 fL (ref 80.0–100.0)
Platelets: 90 K/uL — ABNORMAL LOW (ref 150–400)
RBC: 3.95 MIL/uL (ref 3.87–5.11)
RDW: 13.1 % (ref 11.5–15.5)
WBC: 5.5 K/uL (ref 4.0–10.5)
nRBC: 0 % (ref 0.0–0.2)

## 2024-11-11 MED ORDER — ORAL CARE MOUTH RINSE: 15.0000 mL | OROMUCOSAL | Status: DC | PRN

## 2024-11-11 NOTE — Progress Notes (Signed)
 Heart Failure Navigator Progress Note  Assessed for Heart & Vascular TOC clinic readiness.  Patient does not meet criteria due to she is an Advanced Heart Failure Team patient of Dr. Zenaida. .   Navigator will sign off at this time.   Stephane Haddock, BSN, Scientist, Clinical (histocompatibility And Immunogenetics) Only

## 2024-11-11 NOTE — Progress Notes (Signed)
"  °  Progress Note  Patient Name: Brooke Gray Date of Encounter: 11/11/2024 [facility] Cardiologist: None   Interval Summary   Woke up to a fairly intense productive cough.  Vital Signs Vitals:   11/11/24 0839 11/11/24 0917 11/11/24 1000 11/11/24 1107  BP:      Pulse: 75  87 90  Resp: 20     Temp:      TempSrc:      SpO2: 100% 99% 100% 95%  Weight:      Height:        Intake/Output Summary (Last 24 hours) at 11/11/2024 1109 Last data filed at 11/11/2024 0651 Gross per 24 hour  Intake 1014.79 ml  Output 1200 ml  Net -185.21 ml      11/11/2024    5:49 AM 11/09/2024    8:50 PM 10/31/2024   10:03 AM  Last 3 Weights  Weight (lbs) 123 lb 1.6 oz 127 lb 127 lb 3.2 oz  Weight (kg) 55.838 kg 57.607 kg 57.698 kg      Telemetry/ECG  Sinus rhythm with PVCs- Personally Reviewed  Physical Exam  GEN: No acute distress.   Neck: No JVD Cardiac: Occasional ectopy RRR, no murmurs, rubs, or gallops.  Respiratory: Rhonchi bilaterally. GI: Soft, nontender, non-distended  MS: No edema  Assessment & Plan   78 year old here with influenza A acute on chronic diastolic heart failure mildly elevated troponin up to 183 with hypertensive emergency flash pulmonary edema possible pneumonia  -On Tamiflu  - Troponin elevation likely acute myocardial injury in the setting of influenza as well as heart failure. -Continue with home Entresto  -No Jardiance  secondary to vaginal yeast infection -Continue finerenone  -I do not think she needs another dose of Lasix .  Paroxysmal atrial fibrillation - Not on anticoagulation extensive chart review reveals that after TIA had less than 30-minute bout of atrial fibrillation detected on event monitor.  Has not had any further episodes.  If atrial fibrillation returns, low threshold to resume Eliquis .     For questions or updates, please contact [facility] Please consult [email or web address] for contact info under          Signed, Oneil Parchment, MD   "

## 2024-11-11 NOTE — Progress Notes (Signed)
 " PROGRESS NOTE    Brooke Gray  FMW:[id number] DOB: Nov 10, 1946 DOA: 11/09/2024 PCP: Benjamine Aland, MD    Brief Narrative:  Brooke Gray is a 78 y.o. female with medical history significant of HFpEF, paroxysmal A-fib, renal stenosis status post stenting of right renal artery, hypertension, SVT, hyperlipidemia, anemia, glaucoma presenting with a chief complaint of shortness of breath.  Patient is reporting shortness of breath, orthopnea, cough, and generalized weakness since yesterday.  Shortness of breath was sudden onset yesterday evening.  Denies fevers, chills, or chest pain.  States her cardiologist recently stopped Jardiance  as she was having yeast infections.  She was started on finerenone  20 mg daily.  Also takes Entresto  at home and has been compliant with her medications.  Denies lower extremity edema.  Sister at bedside states patient might have had increased dietary sodium intake recently due to the holidays.  Patient is reporting recent mild weight gain.  Found to have influenza A and possible flash pulmonary edema.  Has diuresed well but slow to improve from the flu   Assessment and Plan: Acute on chronic HFpEF Hypertensive emergency with flash pulmonary edema Patient presenting with sudden onset shortness of breath with elevated pro BNP and pulmonary edema on imaging in the setting of elevated blood pressure (initially 240/140 with EMS and recorded as 188/147 on arrival to the ED).  Last echo done in June 2025 showing EF 70 to 75%, grade 1 diastolic dysfunction, moderate LVH with severe asymmetric septal hypertrophy suggestive of hypertrophic cardiomyopathy, RV systolic function normal, left atrium moderately dilated, mild mitral regurgitation. I have seen the patient with cardiology at bedside.  Cardiology  feels that the overall clinical picture is consistent with flash pulmonary edema.  She was initially placed on BiPAP and was given IV Lasix  40 mg and nitroglycerin  paste in the ED  with improvement of her respiratory status, currently saturating well on 8 L HFNC.  SBP now down to 130-140s.   -Cardiology dosed IV Lasix  as needed-currently do not see a plan to continue further IV Lasix  - Echo similar to prior -Continue home Entresto   - Jardiance  was previously stopped due to concern for vaginal yeast infection.   -Monitor intake and output, daily weights   Sepsis secondary to Influenza A with possible pneumonia CT showing findings concerning for pulmonary edema +/pneumonia.  Influenza A PCR positive.  Met SIRS criteria at the time of presentation with fever, tachycardia, and tachypnea.  - continue antibiotics for now -Procalcitonin elevated Started on Tamiflu  as well   Acute hypoxemic respiratory failure In the setting of problems listed above.  Hypoxic to the mid 70s on room air and was in respiratory distress requiring BiPAP initially.   - Wean to room air as able   Elevated troponin Likely due to demand ischemia.  Patient is not endorsing chest pain and cardiology is not concerned about ACS at this time.  Trend troponin.   Emesis Mild transaminitis -resolved   Steroid-induced hyperglycemia Received Solu-Medrol by EMS.  No history of diabetes and last hemoglobin A1c 5.6 in July 2025 -[facility]   Paroxysmal A-fib Not on anticoagulation. -from chart review: discovered on event monitor back in January 2021 and was reviewed by electrophysiology team at [facility]. In their note on 02/04/2020 it was stated that it was clear that she did have paroxysms of atrial fibrillation less than 30 minutes duration. Extensive discussion was held with her about the implications of this. It was recommended that she start Eliquis  5 mg twice a  day and was given prescription for this before. Encouraged use at that time. She has not been on this medication. If atrial fibrillation returns, resume Eliquis     Hyperlipidemia Continue Zetia .   Glaucoma Continue Xalatan  eyedrops.   Incidental CT  findings Unchanged 6 mm subpleural right upper lobe groundglass nodule, recommend. CT every 2 years until 5 years, as per Fleischner Society Guidelines. Moderate pulmonary emphysema is an independent risk factor for lung cancer, recommend consideration for evaluation for a low-dose CT lung cancer screening program. Will need outpatient follow-up for repeat imaging by PCP   DVT prophylaxis: enoxaparin  (LOVENOX ) injection 40 mg Start: 11/11/24 1000    Code Status: Full Code Family Communication: At bedside  Disposition Plan:  Level of care: Progressive Status is: Inpatient     Consultants:  Cards  Subjective: Sleepy today, still requiring oxygen-dropped as low as 83% on room air  Objective: Vitals:   11/11/24 0917 11/11/24 1000 11/11/24 1107 11/11/24 1152  BP:    100/60  Pulse:  87 90 94  Resp:   20 (!) 21  Temp:    98.9 F (37.2 C)  TempSrc:    Oral  SpO2: 99% 100% 95% 95%  Weight:      Height:        Intake/Output Summary (Last 24 hours) at 11/11/2024 1324 Last data filed at 11/11/2024 9348 Gross per 24 hour  Intake 1014.79 ml  Output 1200 ml  Net -185.21 ml   Filed Weights   11/09/24 2050 11/11/24 0549  Weight: 57.6 kg 55.8 kg    Examination:   General: Appearance:    Well developed, well nourished female in no acute distress     Lungs:   On nasal cannula, respirations unlabored-diminished but no wheezing heard  Heart:    Normal heart rate.  Regular   MS:   All extremities are intact.    Neurologic: Sleepy       Data Reviewed: I have personally reviewed following labs and imaging studies  CBC: Recent Labs  Lab 11/09/24 2103 11/10/24 0748 11/11/24 0520  WBC 11.6* 11.3* 5.5  HGB 14.9 13.0 12.6  HCT 43.3 36.3 35.0*  MCV 91.9 89.2 88.6  PLT 162 103* 90*   Basic Metabolic Panel: Recent Labs  Lab 11/09/24 2103 11/10/24 0748 11/11/24 0520  NA 137 136 135  K 4.0 3.6 3.6  CL 102 99 97*  CO2 23 25 28   GLUCOSE 221* 141* 98  BUN 14 13  15   CREATININE 0.77 0.88 0.75  CALCIUM  9.1 8.6* 8.3*   GFR: Estimated Creatinine Clearance: 49.5 mL/min (by C-G formula based on SCr of 0.75 mg/dL). Liver Function Tests: Recent Labs  Lab 11/09/24 2103 11/10/24 0748  AST 59* 48*  ALT 49* 40  ALKPHOS 83 53  BILITOT 0.4 0.5  PROT 7.5 6.5  ALBUMIN 4.4 4.0   No results for input(s): LIPASE, AMYLASE in the last 168 hours. No results for input(s): AMMONIA in the last 168 hours. Coagulation Profile: Recent Labs  Lab 11/09/24 2103  INR 1.1   Cardiac Enzymes: No results for input(s): CKTOTAL, CKMB, CKMBINDEX, TROPONINI in the last 168 hours. BNP (last 3 results) Recent Labs    11/09/24 2103  PROBNP 1,400.0*   HbA1C: No results for input(s): HGBA1C in the last 72 hours. CBG: Recent Labs  Lab 11/10/24 1351 11/10/24 1653 11/10/24 2111 11/11/24 0751 11/11/24 1154  GLUCAP 135* 104* 117* 84 101*   Lipid Profile: No results for input(s): CHOL, HDL, LDLCALC,  TRIG, CHOLHDL, LDLDIRECT in the last 72 hours. Thyroid  Function Tests: No results for input(s): TSH, T4TOTAL, FREET4, T3FREE, THYROIDAB in the last 72 hours. Anemia Panel: No results for input(s): VITAMINB12, FOLATE, FERRITIN, TIBC, IRON, RETICCTPCT in the last 72 hours. Sepsis Labs: Recent Labs  Lab 11/10/24 0748  PROCALCITON 6.96  LATICACIDVEN 1.5    Recent Results (from the past 240 hours)  Resp panel by RT-PCR (RSV, Flu A&B, Covid) Anterior Nasal Swab     Status: Abnormal   Collection Time: 11/09/24  9:27 PM   Specimen: Anterior Nasal Swab  Result Value Ref Range Status   SARS Coronavirus 2 by RT PCR NEGATIVE NEGATIVE Final   Influenza A by PCR POSITIVE (A) NEGATIVE Final   Influenza B by PCR NEGATIVE NEGATIVE Final    Comment: (NOTE) The Xpert Xpress SARS-CoV-2/FLU/RSV plus assay is intended as an aid in the diagnosis of influenza from Nasopharyngeal swab specimens and should not be used as a sole basis  for treatment. Nasal washings and aspirates are unacceptable for Xpert Xpress SARS-CoV-2/FLU/RSV testing.  Fact Sheet for Patients: bloggercourse.com  Fact Sheet for Healthcare Providers: seriousbroker.it  This test is not yet approved or cleared by the [place]  FDA and has been authorized for detection and/or diagnosis of SARS-CoV-2 by FDA under an Emergency Use Authorization (EUA). This EUA will remain in effect (meaning this test can be used) for the duration of the COVID-19 declaration under Section 564(b)(1) of the Act, 21 U.S.C. section 360bbb-3(b)(1), unless the authorization is terminated or revoked.     Resp Syncytial Virus by PCR NEGATIVE NEGATIVE Final    Comment: (NOTE) Fact Sheet for Patients: bloggercourse.com  Fact Sheet for Healthcare Providers: seriousbroker.it  This test is not yet approved or cleared by the [place]  FDA and has been authorized for detection and/or diagnosis of SARS-CoV-2 by FDA under an Emergency Use Authorization (EUA). This EUA will remain in effect (meaning this test can be used) for the duration of the COVID-19 declaration under Section 564(b)(1) of the Act, 21 U.S.C. section 360bbb-3(b)(1), unless the authorization is terminated or revoked.  Performed at [facility], [address] [place], [place] [address]   Culture, blood (Routine X 2) w Reflex to ID Panel     Status: None (Preliminary result)   Collection Time: 11/10/24  7:48 AM   Specimen: BLOOD  Result Value Ref Range Status   Specimen Description BLOOD BLOOD LEFT HAND  Final   Special Requests   Final    BOTTLES DRAWN AEROBIC AND ANAEROBIC Blood Culture adequate volume   Culture   Final    NO GROWTH < 24 HOURS Performed at [facility], [address] [place], [place] [address]    Report Status PENDING  Incomplete  Culture, blood (Routine X 2) w  Reflex to ID Panel     Status: None (Preliminary result)   Collection Time: 11/10/24  7:48 AM   Specimen: BLOOD  Result Value Ref Range Status   Specimen Description BLOOD BLOOD RIGHT HAND  Final   Special Requests   Final    BOTTLES DRAWN AEROBIC AND ANAEROBIC Blood Culture adequate volume   Culture   Final    NO GROWTH < 24 HOURS Performed at [facility], [address], [place], [place] [address]    Report Status PENDING  Incomplete         Radiology Studies: ECHOCARDIOGRAM COMPLETE Result Date: 11/10/2024    ECHOCARDIOGRAM REPORT  Patient Name:   MARCH JOOS Date of Exam: 11/10/2024 Medical Rec #:  [id number]       Height:       63.0 in Accession #:    [id number]      Weight:       127.0 lb Date of Birth:  [birth year]       BSA:          1.594 m Patient Age:    78 years        BP:           113/64 mmHg Patient Gender: F               HR:           89 bpm. Exam Location:  Inpatient Procedure: 2D Echo, Color Doppler and Cardiac Doppler (Both Spectral and Color            Flow Doppler were utilized during procedure). Indications:    CHF-Acute Diastolic I50.31  History:        Patient has prior history of Echocardiogram examinations, most                 recent 05/09/2024. Arrythmias:Atrial Fibrillation;                 Signs/Symptoms:Hypertensive Heart Disease.  Sonographer:    Sydnee Wilson RDCS Referring Phys: [id number] VASUNDHRA RATHORE IMPRESSIONS  1. Maximal septal thickness 14 mm. No systolic anterior motion of the mitral valve. Systolic anterior motion of the mitral valve is not see at rest. No Valsalva performed. Left ventricular ejection fraction, by estimation, is 65 to 70%. The left ventricle has normal function. The left ventricle has no regional wall motion abnormalities. There is moderate asymmetric left ventricular hypertrophy of the septal segment. Left ventricular diastolic parameters were normal.  2. Right ventricular systolic function is hyperdynamic. The right  ventricular size is normal.  3. The mitral valve is normal in structure. Mild mitral valve regurgitation.  4. The aortic valve is tricuspid. Aortic valve regurgitation is not visualized. No aortic stenosis is present.  5. The inferior vena cava is normal in size with greater than 50% respiratory variability, suggesting right atrial pressure of 3 mmHg. Comparison(s): No significant change from prior study. FINDINGS  Left Ventricle: Maximal septal thickness 14 mm. No systolic anterior motion of the mitral valve. Systolic anterior motion of the mitral valve is not see at rest. No Valsalva performed. Left ventricular ejection fraction, by estimation, is 65 to 70%. The  left ventricle has normal function. The left ventricle has no regional wall motion abnormalities. The left ventricular internal cavity size was normal in size. There is moderate asymmetric left ventricular hypertrophy of the septal segment. Left ventricular diastolic parameters were normal. Right Ventricle: The right ventricular size is normal. No increase in right ventricular wall thickness. Right ventricular systolic function is hyperdynamic. Left Atrium: Left atrial size was normal in size. Right Atrium: Right atrial size was normal in size. Pericardium: There is no evidence of pericardial effusion. Mitral Valve: The mitral valve is normal in structure. Mild mitral valve regurgitation. Tricuspid Valve: The tricuspid valve is normal in structure. Tricuspid valve regurgitation is trivial. No evidence of tricuspid stenosis. Aortic Valve: The aortic valve is tricuspid. Aortic valve regurgitation is not visualized. No aortic stenosis is present. Pulmonic Valve: The pulmonic valve was not well visualized. Pulmonic valve regurgitation is not visualized. No evidence of pulmonic stenosis. Aorta: The aortic root and ascending aorta are structurally normal,  with no evidence of dilitation. Venous: The inferior vena cava is normal in size with greater than 50%  respiratory variability, suggesting right atrial pressure of 3 mmHg. IAS/Shunts: The interatrial septum was not well visualized.  LEFT VENTRICLE PLAX 2D LVIDd:         4.00 cm     Diastology LVIDs:         2.50 cm     LV e' medial:    7.51 cm/s LV PW:         1.10 cm     LV E/e' medial:  12.9 LV IVS:        1.40 cm     LV e' lateral:   7.83 cm/s LVOT diam:     2.00 cm     LV E/e' lateral: 12.4 LV SV:         64 LV SV Index:   40 LVOT Area:     3.14 cm  LV Volumes (MOD) LV vol d, MOD A2C: 96.4 ml LV vol d, MOD A4C: 81.2 ml LV vol s, MOD A2C: 40.5 ml LV vol s, MOD A4C: 39.9 ml LV SV MOD A2C:     55.9 ml LV SV MOD A4C:     81.2 ml LV SV MOD BP:      51.5 ml RIGHT VENTRICLE TAPSE (M-mode): 1.8 cm LEFT ATRIUM             Index        RIGHT ATRIUM           Index LA diam:        3.90 cm 2.45 cm/m   RA Area:     12.00 cm LA Vol (A2C):   29.2 ml 18.32 ml/m  RA Volume:   23.20 ml  14.55 ml/m LA Vol (A4C):   53.6 ml 33.62 ml/m LA Biplane Vol: 39.7 ml 24.90 ml/m  AORTIC VALVE LVOT Vmax:   112.00 cm/s LVOT Vmean:  80.100 cm/s LVOT VTI:    0.205 m  AORTA Ao Root diam: 3.20 cm Ao Asc diam:  3.30 cm MITRAL VALVE MV Area (PHT): 4.31 cm    SHUNTS MV Decel Time: 176 msec    Systemic VTI:  0.20 m MV E velocity: 97.20 cm/s  Systemic Diam: 2.00 cm MV A velocity: 55.10 cm/s MV E/A ratio:  1.76 Stanly Leavens MD Electronically signed by Stanly Leavens MD Signature Date/Time: 11/10/2024/12:31:01 PM    Final    CT Angio Chest PE W and/or Wo Contrast Result Date: 11/10/2024 EXAM: CTA CHEST 11/10/2024 12:14:27 AM TECHNIQUE: CTA of the chest was performed without and with the administration of 75 mL of iohexol  (OMNIPAQUE ) 350 MG/ML injection. Multiplanar reformatted images are provided for review. MIP images are provided for review. Automated exposure control, iterative reconstruction, and/or weight based adjustment of the mA/kV was utilized to reduce the radiation dose to as low as reasonably achievable. COMPARISON:  Portable chest yesterday, portable chest 12/07/2022, and CTA chest 11/14/2022. CLINICAL HISTORY: Pulmonary embolism (PE) suspected, high prob. FINDINGS: PULMONARY ARTERIES: Pulmonary arteries are adequately opacified for evaluation. The pulmonary arteries are upper normal in caliber. No evidence of arterial embolism. MEDIASTINUM: There is mild to moderate cardiomegaly with left chamber predominance and septal hypertrophy. Central venous distention is noted. There is no pericardial effusion. No visible coronary calcification. There is aortic atherosclerosis and tortuosity without aneurysm or dissection. There is atherosclerosis in the great vessels with normal variant 2-vessel aortic arch with a brachiobicarotid trunk. There is  IVC contrast reflux with pooling or settling of contrast in the IVC and right hepatic veins consistent with slow flow, such as with right heart dysfunction. The finding of contrast pooling could also be seen with impending cardiac arrest and shock. LYMPH NODES: No mediastinal, hilar or axillary lymphadenopathy. LUNGS AND PLEURA: There are symmetric small layering pleural effusions. Moderate emphysematous disease with centrilobular change predominating and paraseptal change in the upper apices. Posterior interstitial and airspace opacities are noted of the right upper and both lower lobes, in the lower lobes greater on the right. There is posterior dense consolidation in the right greater than left lower lobes, which could all be due to airspace edema or could be a combination of airspace edema and pneumonia. Overall findings are favored to represent CHF or fluid overload with asymmetry. Calcified granulomas are again noted in the right upper lobe. A 6 mm subpleural right upper lobe ground glass nodule is unchanged on axial 48 of series 7. Follow-up CT recommended every 2 years until 5 years of stability is established. The lungs are otherwise clear. No pneumothorax. UPPER ABDOMEN: Limited  images of the upper abdomen are unremarkable. SOFT TISSUES AND BONES: There is degenerative change and mild kyphodextroscoliosis of the thoracic spine. No acute or significant osseous findings. No acute soft tissue abnormality. IMPRESSION: 1. No evidence of pulmonary embolism. 2. Findings consistent with CHF or fluid overload with asymmetry, including posterior dense consolidation in the right greater than left lower lobes, which could represent airspace edema or a combination of airspace edema and pneumonia. 3. Mild to moderate cardiomegaly with left chamber predominance, central venous distention, and septal hypertrophy, with IVC contrast reflux and pooling in the IVC and right hepatic veins consistent with slow flow such as with right heart dysfunction ( impending cardiac arrest and shock may also be associated with this finding ). 4. Unchanged 6 mm subpleural right upper lobe groundglass nodule, recommend. CT every 2 years until 5 years, as per Fleischner Society Guidelines. 5. Moderate pulmonary emphysema is an independent risk factor for lung cancer, recommend consideration for evaluation for a low-dose CT lung cancer screening program. Electronically signed by: Francis Quam MD 11/10/2024 01:15 AM EST RP Workstation: [id number]   DG Chest Port 1 View Result Date: 11/09/2024 EXAM: 1 VIEW(S) XRAY OF THE CHEST 11/09/2024 09:19:00 PM COMPARISON: None available. CLINICAL HISTORY: sob FINDINGS: LUNGS AND PLEURA: Moderate interstitial and alveolar pulmonary edema, with asymmetric infiltrate at the right lung base likely reflecting asymmetric pulmonary edema. Superimposed pneumonic consolidation, however, is difficult to exclude. This appears similar to prior examination of 11/07/2023, favoring recurrent edema, however. Superimposed small right pleural effusion. No pneumothorax. HEART AND MEDIASTINUM: Heart size at upper limits. Aortic atherosclerosis. BONES AND SOFT TISSUES: No acute osseous abnormality.  IMPRESSION: 1. Moderate interstitial and alveolar pulmonary edema with asymmetric right basilar opacity, likely asymmetric pulmonary edema; superimposed pneumonic consolidation is difficult to exclude. 2. Small right pleural effusion. 3. Heart size at the upper limits of normal. 4. Aortic atherosclerosis. Electronically signed by: Dorethia Molt MD 11/09/2024 10:29 PM EST RP Workstation: [id number]        Scheduled Meds:  aspirin  EC  81 mg Oral Daily   enoxaparin  (LOVENOX ) injection  40 mg Subcutaneous Q24H   ezetimibe   10 mg Oral Daily   Finerenone   20 mg Oral Daily   insulin  aspart  0-5 Units Subcutaneous QHS   insulin  aspart  0-9 Units Subcutaneous TID WC   ipratropium-albuterol   3 mL Nebulization Q6H   latanoprost   1 drop Both Eyes QHS   oseltamivir   30 mg Oral BID   sacubitril -valsartan   1 tablet Oral BID   Continuous Infusions:  sodium chloride  Stopped (11/11/24 0049)   azithromycin  (ZITHROMAX ) 500 mg in sodium chloride  0.9 % 250 mL IVPB Stopped (11/10/24 2357)   cefTRIAXone  (ROCEPHIN )  IV Stopped (11/10/24 2214)     LOS: 1 day    Time spent: 45 minutes spent on chart review, discussion with nursing staff, consultants, updating family and interview/physical exam; more than 50% of that time was spent in counseling and/or coordination of care.    Zniyah Midkiff U Samarah Hogle, DO [facility] Available via Epic secure chat 7am-7pm After these hours, please refer to coverage provider listed on [email or web address] 11/11/2024, 1:24 PM   "

## 2024-11-12 ENCOUNTER — Other Ambulatory Visit: Payer: Self-pay

## 2024-11-12 DIAGNOSIS — I5033 Acute on chronic diastolic (congestive) heart failure: Secondary | ICD-10-CM | POA: Diagnosis not present

## 2024-11-12 DIAGNOSIS — R7989 Other specified abnormal findings of blood chemistry: Secondary | ICD-10-CM | POA: Diagnosis not present

## 2024-11-12 LAB — CBC
HCT: 34 % — ABNORMAL LOW (ref 36.0–46.0)
Hemoglobin: 12.1 g/dL (ref 12.0–15.0)
MCH: 31.5 pg (ref 26.0–34.0)
MCHC: 35.6 g/dL (ref 30.0–36.0)
MCV: 88.5 fL (ref 80.0–100.0)
Platelets: 105 K/uL — ABNORMAL LOW (ref 150–400)
RBC: 3.84 MIL/uL — ABNORMAL LOW (ref 3.87–5.11)
RDW: 12.8 % (ref 11.5–15.5)
WBC: 3.2 K/uL — ABNORMAL LOW (ref 4.0–10.5)
nRBC: 0 % (ref 0.0–0.2)

## 2024-11-12 LAB — GLUCOSE, CAPILLARY
Glucose-Capillary: 94 mg/dL (ref 70–99)
Glucose-Capillary: 142 mg/dL — ABNORMAL HIGH (ref 70–99)
Glucose-Capillary: 114 mg/dL — ABNORMAL HIGH (ref 70–99)

## 2024-11-12 LAB — BASIC METABOLIC PANEL WITH GFR
Anion gap: 7 (ref 5–15)
BUN: 15 mg/dL (ref 8–23)
CO2: 30 mmol/L (ref 22–32)
Calcium: 8.6 mg/dL — ABNORMAL LOW (ref 8.9–10.3)
Chloride: 100 mmol/L (ref 98–111)
Creatinine, Ser: 0.67 mg/dL (ref 0.44–1.00)
GFR, Estimated: >60 mL/min
Glucose, Bld: 102 mg/dL — ABNORMAL HIGH (ref 70–99)
Potassium: 3.8 mmol/L (ref 3.5–5.1)
Sodium: 137 mmol/L (ref 135–145)

## 2024-11-12 MED ORDER — POLYETHYLENE GLYCOL 3350 17 G PO PACK
17.0000 g | PACK | Freq: Every day | ORAL | Status: DC
  Administered 2024-11-12: 17 g via ORAL
  Filled 2024-11-12 (×2): qty 1

## 2024-11-12 MED ORDER — OSELTAMIVIR PHOSPHATE 30 MG PO CAPS: 30.0000 mg | ORAL_CAPSULE | Freq: Every day | ORAL | Status: DC

## 2024-11-12 MED ORDER — OSELTAMIVIR PHOSPHATE 30 MG PO CAPS
30.0000 mg | ORAL_CAPSULE | Freq: Two times a day (BID) | ORAL | Status: DC
  Administered 2024-11-12 – 2024-11-13 (×2): 30 mg via ORAL
  Filled 2024-11-12 (×3): qty 1

## 2024-11-12 MED ORDER — GUAIFENESIN ER 600 MG PO TB12
600.0000 mg | ORAL_TABLET | Freq: Two times a day (BID) | ORAL | Status: DC
  Administered 2024-11-12 – 2024-11-13 (×3): 600 mg via ORAL
  Filled 2024-11-12 (×3): qty 1

## 2024-11-12 NOTE — Progress Notes (Signed)
 " PROGRESS NOTE    Brooke Gray  FMW:[id number] DOB: 12-Dec-1945 DOA: 11/09/2024 PCP: Benjamine Aland, MD    Brief Narrative:  Brooke Gray is a 78 y.o. female with medical history significant of HFpEF, paroxysmal A-fib, renal stenosis status post stenting of right renal artery, hypertension, SVT, hyperlipidemia, anemia, glaucoma presenting with a chief complaint of shortness of breath.  Patient is reporting shortness of breath, orthopnea, cough, and generalized weakness since yesterday.  Shortness of breath was sudden onset yesterday evening.  Denies fevers, chills, or chest pain.  States her cardiologist recently stopped Jardiance  as she was having yeast infections.  She was started on finerenone  20 mg daily.  Also takes Entresto  at home and has been compliant with her medications.  Denies lower extremity edema.  Sister at bedside states patient might have had increased dietary sodium intake recently due to the holidays.  Patient is reporting recent mild weight gain.  Found to have influenza A and possible flash pulmonary edema.  Has diuresed well but slow to improve from the flu   Assessment and Plan: Acute on chronic HFpEF Hypertensive emergency with flash pulmonary edema Patient presenting with sudden onset shortness of breath with elevated pro BNP and pulmonary edema on imaging in the setting of elevated blood pressure (initially 240/140 with EMS and recorded as 188/147 on arrival to the ED).  Last echo done in June 2025 showing EF 70 to 75%, grade 1 diastolic dysfunction, moderate LVH with severe asymmetric septal hypertrophy suggestive of hypertrophic cardiomyopathy, RV systolic function normal, left atrium moderately dilated, mild mitral regurgitation. I have seen the patient with cardiology at bedside.  Cardiology  feels that the overall clinical picture is consistent with flash pulmonary edema.  She was initially placed on BiPAP and was given IV Lasix  40 mg and nitroglycerin  paste in the ED  with improvement of her respiratory status--wean to room air as able - Treated with IV Lasix  but that since has been stopped as she is euvolemic - Echo similar to prior -Continue home Entresto   - Jardiance  was previously stopped due to concern for vaginal yeast infection.   -Monitor intake and output, daily weights   Sepsis secondary to Influenza A with possible pneumonia CT showing findings concerning for pulmonary edema +/pneumonia.  Influenza A PCR positive.  Met SIRS criteria at the time of presentation with fever, tachycardia, and tachypnea.  - continue antibiotics for now -Procalcitonin elevated Started on Tamiflu  as well -Pulmonary toiletry   Acute hypoxemic respiratory failure In the setting of problems listed above.  Hypoxic to the mid 70s on room air and was in respiratory distress requiring BiPAP initially.   - Wean to room air as able   Elevated troponin Likely due to demand ischemia.  Patient is not endorsing chest pain and cardiology is not concerned about ACS at this time.  Trend troponin.   Emesis Mild transaminitis -resolved   Steroid-induced hyperglycemia Received Solu-Medrol by EMS.  No history of diabetes and last hemoglobin A1c 5.6 in July 2025 -[facility]   Paroxysmal A-fib Not on anticoagulation. -from chart review: discovered on event monitor back in January 2021 and was reviewed by electrophysiology team at [facility]. In their note on 02/04/2020 it was stated that it was clear that she did have paroxysms of atrial fibrillation less than 30 minutes duration. Extensive discussion was held with her about the implications of this. It was recommended that she start Eliquis  5 mg twice a day and was given prescription for this before. Encouraged  use at that time. She has not been on this medication. If atrial fibrillation returns, resume Eliquis     Hyperlipidemia Continue Zetia .   Glaucoma Continue Xalatan  eyedrops.   Incidental CT findings Unchanged 6 mm subpleural  right upper lobe groundglass nodule, recommend. CT every 2 years until 5 years, as per Fleischner Society Guidelines. Moderate pulmonary emphysema is an independent risk factor for lung cancer, recommend consideration for evaluation for a low-dose CT lung cancer screening program. Will need outpatient follow-up for repeat imaging by PCP   DVT prophylaxis: enoxaparin  (LOVENOX ) injection 40 mg Start: 11/11/24 1000    Code Status: Full Code Family Communication: At bedside  Disposition Plan:  Level of care: Progressive Status is: Inpatient     Consultants:  Cards  Subjective: Feeling better but states she is having trouble coughing up the mucus  Objective: Vitals:   11/12/24 0633 11/12/24 0842 11/12/24 0959 11/12/24 1122  BP: 114/83 125/80  (!) 122/91  Pulse:  61 73 80  Resp: 20 20  19   Temp: (!) 97.5 F (36.4 C) 97.8 F (36.6 C)  98 F (36.7 C)  TempSrc: Oral Oral  Oral  SpO2:  100% 100% 100%  Weight: 55.7 kg     Height:        Intake/Output Summary (Last 24 hours) at 11/12/2024 1230 Last data filed at 11/12/2024 1155 Gross per 24 hour  Intake --  Output 1700 ml  Net -1700 ml   Filed Weights   11/09/24 2050 11/11/24 0549 11/12/24 0633  Weight: 57.6 kg 55.8 kg 55.7 kg    Examination:   General: Appearance:    Well developed, well nourished female in no acute distress     Lungs:   Diminished, no wheezing  Heart:    Normal heart rate.  Regular   MS:   All extremities are intact.    Neurologic: Alert and oriented x 3       Data Reviewed: I have personally reviewed following labs and imaging studies  CBC: Recent Labs  Lab 11/09/24 2103 11/10/24 0748 11/11/24 0520 11/12/24 0525  WBC 11.6* 11.3* 5.5 3.2*  HGB 14.9 13.0 12.6 12.1  HCT 43.3 36.3 35.0* 34.0*  MCV 91.9 89.2 88.6 88.5  PLT 162 103* 90* 105*   Basic Metabolic Panel: Recent Labs  Lab 11/09/24 2103 11/10/24 0748 11/11/24 0520 11/12/24 0525  NA 137 136 135 137  K 4.0 3.6 3.6  3.8  CL 102 99 97* 100  CO2 23 25 28 30   GLUCOSE 221* 141* 98 102*  BUN 14 13 15 15   CREATININE 0.77 0.88 0.75 0.67  CALCIUM  9.1 8.6* 8.3* 8.6*   GFR: Estimated Creatinine Clearance: 49.5 mL/min (by C-G formula based on SCr of 0.67 mg/dL). Liver Function Tests: Recent Labs  Lab 11/09/24 2103 11/10/24 0748  AST 59* 48*  ALT 49* 40  ALKPHOS 83 53  BILITOT 0.4 0.5  PROT 7.5 6.5  ALBUMIN 4.4 4.0   No results for input(s): LIPASE, AMYLASE in the last 168 hours. No results for input(s): AMMONIA in the last 168 hours. Coagulation Profile: Recent Labs  Lab 11/09/24 2103  INR 1.1   Cardiac Enzymes: No results for input(s): CKTOTAL, CKMB, CKMBINDEX, TROPONINI in the last 168 hours. BNP (last 3 results) Recent Labs    11/09/24 2103  PROBNP 1,400.0*   HbA1C: No results for input(s): HGBA1C in the last 72 hours. CBG: Recent Labs  Lab 11/11/24 1154 11/11/24 1644 11/11/24 2115 11/12/24 0831 11/12/24 1119  GLUCAP 101* 98 133* 94 142*   Lipid Profile: No results for input(s): CHOL, HDL, LDLCALC, TRIG, CHOLHDL, LDLDIRECT in the last 72 hours. Thyroid  Function Tests: No results for input(s): TSH, T4TOTAL, FREET4, T3FREE, THYROIDAB in the last 72 hours. Anemia Panel: No results for input(s): VITAMINB12, FOLATE, FERRITIN, TIBC, IRON, RETICCTPCT in the last 72 hours. Sepsis Labs: Recent Labs  Lab 11/10/24 0748  PROCALCITON 6.96  LATICACIDVEN 1.5    Recent Results (from the past 240 hours)  Resp panel by RT-PCR (RSV, Flu A&B, Covid) Anterior Nasal Swab     Status: Abnormal   Collection Time: 11/09/24  9:27 PM   Specimen: Anterior Nasal Swab  Result Value Ref Range Status   SARS Coronavirus 2 by RT PCR NEGATIVE NEGATIVE Final   Influenza A by PCR POSITIVE (A) NEGATIVE Final   Influenza B by PCR NEGATIVE NEGATIVE Final    Comment: (NOTE) The Xpert Xpress SARS-CoV-2/FLU/RSV plus assay is intended as an aid in the  diagnosis of influenza from Nasopharyngeal swab specimens and should not be used as a sole basis for treatment. Nasal washings and aspirates are unacceptable for Xpert Xpress SARS-CoV-2/FLU/RSV testing.  Fact Sheet for Patients: bloggercourse.com  Fact Sheet for Healthcare Providers: seriousbroker.it  This test is not yet approved or cleared by the [place]  FDA and has been authorized for detection and/or diagnosis of SARS-CoV-2 by FDA under an Emergency Use Authorization (EUA). This EUA will remain in effect (meaning this test can be used) for the duration of the COVID-19 declaration under Section 564(b)(1) of the Act, 21 U.S.C. section 360bbb-3(b)(1), unless the authorization is terminated or revoked.     Resp Syncytial Virus by PCR NEGATIVE NEGATIVE Final    Comment: (NOTE) Fact Sheet for Patients: bloggercourse.com  Fact Sheet for Healthcare Providers: seriousbroker.it  This test is not yet approved or cleared by the [place]  FDA and has been authorized for detection and/or diagnosis of SARS-CoV-2 by FDA under an Emergency Use Authorization (EUA). This EUA will remain in effect (meaning this test can be used) for the duration of the COVID-19 declaration under Section 564(b)(1) of the Act, 21 U.S.C. section 360bbb-3(b)(1), unless the authorization is terminated or revoked.  Performed at [facility], [address], [place], [place] [address]   Culture, blood (Routine X 2) w Reflex to ID Panel     Status: None (Preliminary result)   Collection Time: 11/10/24  7:48 AM   Specimen: BLOOD  Result Value Ref Range Status   Specimen Description BLOOD BLOOD LEFT HAND  Final   Special Requests   Final    BOTTLES DRAWN AEROBIC AND ANAEROBIC Blood Culture adequate volume   Culture   Final    NO GROWTH 2 DAYS Performed at [facility], [address],  [place], [place] [address]    Report Status PENDING  Incomplete  Culture, blood (Routine X 2) w Reflex to ID Panel     Status: None (Preliminary result)   Collection Time: 11/10/24  7:48 AM   Specimen: BLOOD  Result Value Ref Range Status   Specimen Description BLOOD BLOOD RIGHT HAND  Final   Special Requests   Final    BOTTLES DRAWN AEROBIC AND ANAEROBIC Blood Culture adequate volume   Culture   Final    NO GROWTH 2 DAYS Performed at [facility], [address], [place], [place] [address]    Report Status PENDING  Incomplete  Radiology Studies: No results found.       Scheduled Meds:  aspirin  EC  81 mg Oral Daily   enoxaparin  (LOVENOX ) injection  40 mg Subcutaneous Q24H   ezetimibe   10 mg Oral Daily   Finerenone   20 mg Oral Daily   guaiFENesin   600 mg Oral BID   latanoprost   1 drop Both Eyes QHS   oseltamivir   30 mg Oral BID   polyethylene glycol  17 g Oral Daily   sacubitril -valsartan   1 tablet Oral BID   Continuous Infusions:  azithromycin  (ZITHROMAX ) 500 mg in sodium chloride  0.9 % 250 mL IVPB 500 mg (11/12/24 0024)   cefTRIAXone  (ROCEPHIN )  IV 1 g (11/11/24 2302)     LOS: 2 days    Time spent: 45 minutes spent on chart review, discussion with nursing staff, consultants, updating family and interview/physical exam; more than 50% of that time was spent in counseling and/or coordination of care.    Ercia Crisafulli U Dale Strausser, DO [facility] Available via Epic secure chat 7am-7pm After these hours, please refer to coverage provider listed on [email or web address] 11/12/2024, 12:30 PM   "

## 2024-11-12 NOTE — Progress Notes (Signed)
 Chaplain responded to [facility] consult for Advance Directive. Ms Gombos shared that she was familiar with HCPOA as she has served in that capacity for her sister. She was reluctant to engage too much in the AD conversation, anticipating her son walking in at any moment and aware of his anxiety around the subject. She shared that she thinks she'll either have her husband or brother serve in this capacity and is aware of how to complete in house or do independently and get it into EPIC.  Alan HERO. Davee Lomax, M.Div. [facility] Chaplain Pager [phone] Office [phone].

## 2024-11-12 NOTE — Progress Notes (Signed)
"  °  Progress Note  Patient Name: Brooke Gray Date of Encounter: 11/12/2024 [facility] Cardiologist: None   Interval Summary   Loose, productive cough, occurred at the end of our conversation. Feels better.   Vital Signs Vitals:   11/11/24 1152 11/11/24 1641 11/11/24 2031 11/11/24 2325  BP: 100/60 127/85 108/71   Pulse: 94  78   Resp: (!) 21 20 20    Temp: 98.9 F (37.2 C) 99 F (37.2 C) 98 F (36.7 C) 98.9 F (37.2 C)  TempSrc: Oral Oral Oral Oral  SpO2: 95% 100% 100%   Weight:      Height:        Intake/Output Summary (Last 24 hours) at 11/12/2024 0546 Last data filed at 11/11/2024 2300 Gross per 24 hour  Intake 300 ml  Output 750 ml  Net -450 ml      11/11/2024    5:49 AM 11/09/2024    8:50 PM 10/31/2024   10:03 AM  Last 3 Weights  Weight (lbs) 123 lb 1.6 oz 127 lb 127 lb 3.2 oz  Weight (kg) 55.838 kg 57.607 kg 57.698 kg      Telemetry/ECG  No adverse arrhythmias- Personally Reviewed  Physical Exam  GEN: No acute distress.   Neck: No JVD Cardiac: RRR, no murmurs, rubs, or gallops.  Respiratory: No wheezes bilaterally. GI: Soft, nontender, non-distended  MS: No edema  Assessment & Plan   78 year old female here with flu type a pneumonia with history of chronic diastolic heart failure, elevated troponin of 180 with hypertensive emergency flash pulmonary edema.  Ejection fraction 75% with pattern of hypertrophic cardiomyopathy.  Emphysematous changes on CT.  -Currently on Tamiflu , ceftriaxone  azithromycin  IV, original temperature 101.5. -Mild elevation in troponin likely acute myocardial injury in the setting of influenza as well as heart failure -Continuing with home Entresto  24/26 twice daily as well as finerenone  20 mg daily (new start). -No SGLT2 inhibitor secondary to yeast infection.  Remote history of paroxysmal atrial fibrillation 2021 - Extensive chart review, care everywhere, event monitor demonstrated less than 30 minutes of  atrial fibrillation.  Was evaluated at [facility] back in 2021 and Eliquis  was recommended but she did not take.  She has not had any other further documented episodes.  Obviously if atrial fibrillation returns, Eliquis  will be needed.  Mildly elevated troponin - Acute myocardial injury in the setting of flu  Seems like she may be getting close to DC.    For questions or updates, please contact [facility] Please consult [email or web address] for contact info under         Signed, Oneil Parchment, MD   "

## 2024-11-12 NOTE — Progress Notes (Signed)
 11/12/2024 01:29  Extravasation of azithromycin  500 mg in NS 0.9% in the LAC. Site red, swollen & painful. Aspiration of med attempted but no residual noted. PIV removed and Lynwood Lites, Rph contacted. Heat pack applied & 500 mg tylenol  administered

## 2024-11-13 ENCOUNTER — Other Ambulatory Visit (HOSPITAL_COMMUNITY): Payer: Self-pay

## 2024-11-13 DIAGNOSIS — R7989 Other specified abnormal findings of blood chemistry: Secondary | ICD-10-CM | POA: Diagnosis not present

## 2024-11-13 DIAGNOSIS — I5033 Acute on chronic diastolic (congestive) heart failure: Secondary | ICD-10-CM | POA: Diagnosis not present

## 2024-11-13 LAB — CBC
HCT: 35.4 % — ABNORMAL LOW (ref 36.0–46.0)
Hemoglobin: 12.6 g/dL (ref 12.0–15.0)
MCH: 31 pg (ref 26.0–34.0)
MCHC: 35.6 g/dL (ref 30.0–36.0)
MCV: 87.2 fL (ref 80.0–100.0)
Platelets: 106 K/uL — ABNORMAL LOW (ref 150–400)
RBC: 4.06 MIL/uL (ref 3.87–5.11)
RDW: 12.8 % (ref 11.5–15.5)
WBC: 2.4 K/uL — ABNORMAL LOW (ref 4.0–10.5)
nRBC: 0 % (ref 0.0–0.2)

## 2024-11-13 LAB — BASIC METABOLIC PANEL WITH GFR
Anion gap: 9 (ref 5–15)
BUN: 10 mg/dL (ref 8–23)
CO2: 26 mmol/L (ref 22–32)
Calcium: 8.8 mg/dL — ABNORMAL LOW (ref 8.9–10.3)
Chloride: 101 mmol/L (ref 98–111)
Creatinine, Ser: 0.5 mg/dL (ref 0.44–1.00)
GFR, Estimated: >60 mL/min
Glucose, Bld: 107 mg/dL — ABNORMAL HIGH (ref 70–99)
Potassium: 3.7 mmol/L (ref 3.5–5.1)
Sodium: 136 mmol/L (ref 135–145)

## 2024-11-13 LAB — GLUCOSE, CAPILLARY: Glucose-Capillary: 114 mg/dL — ABNORMAL HIGH (ref 70–99)

## 2024-11-13 MED ORDER — SACUBITRIL-VALSARTAN 49-51 MG PO TABS
1.0000 | ORAL_TABLET | Freq: Two times a day (BID) | ORAL | Status: DC
  Administered 2024-11-13: 1 via ORAL
  Filled 2024-11-13: qty 1

## 2024-11-13 MED ORDER — SACUBITRIL-VALSARTAN 49-51 MG PO TABS
1.0000 | ORAL_TABLET | Freq: Two times a day (BID) | ORAL | 0 refills | Status: AC
  Filled 2024-11-13: qty 60, 30d supply, fill #0

## 2024-11-13 MED ORDER — KERENDIA 20 MG PO TABS
20.0000 mg | ORAL_TABLET | Freq: Every day | ORAL | 0 refills | Status: AC
  Filled 2024-11-13: qty 30, 30d supply, fill #0

## 2024-11-13 MED ORDER — GUAIFENESIN ER 600 MG PO TB12
600.0000 mg | ORAL_TABLET | Freq: Two times a day (BID) | ORAL | 0 refills | Status: AC | PRN
  Filled 2024-11-13: qty 14, 7d supply, fill #0

## 2024-11-13 MED ORDER — OSELTAMIVIR PHOSPHATE 30 MG PO CAPS
30.0000 mg | ORAL_CAPSULE | Freq: Two times a day (BID) | ORAL | 0 refills | Status: AC
  Filled 2024-11-13: qty 4, 2d supply, fill #0

## 2024-11-13 MED ORDER — AMOXICILLIN-POT CLAVULANATE 875-125 MG PO TABS
1.0000 | ORAL_TABLET | Freq: Two times a day (BID) | ORAL | 0 refills | Status: AC
  Filled 2024-11-13: qty 2, 1d supply, fill #0

## 2024-11-13 NOTE — Progress Notes (Addendum)
 Reviewed AVS, patient expressed understanding of medications, MD follow up reviewed.   Removed IV, Site clean, dry and intact.  See LDA for information on wounds at discharge. Patient states all belongings except tennis shoes brought to the hospital at time of admission are accounted for and packed to take home.  Picked up medications from [facility] pharmacy. Nursing staffcontacted to transport patient to entrance A,where family member was waiting in vehicle to transport home.

## 2024-11-13 NOTE — Evaluation (Signed)
 Physical Therapy Evaluation Patient Details Name: Brooke Gray MRN: [id number] DOB: 08-Jul-1946 Today's Date: 11/13/2024  History of Present Illness  Pt is a 78 y.o. F prsenting to [facility] on 11/09/24 with SOB, cough, and generalized weakness. Pt admitted with acute on chronic HF and sepsis secondary to influenza with possible PNA. PMH is significant for HF, A-fib, renal stenosis and s/p stenting of R renal artery, SVT, glaucoma, cardiomegly, HTN, TIA, globus sensation, severe HA   Clinical Impression  Prior to admittance, pt was mobilizing independently and was independent with ADLs. 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 Pt presents to evaluation with deficits in mobility, activity tolerance, and balance. Pt was able to ambulate household level distances without an AD and no physical assistance. PT will continue to treat pt while she is admitted. Anticipate no follow up therapies needed at discharge.       If plan is discharge home, recommend the following: Assistance with cooking/housework;Assist for transportation   Can travel by private vehicle        Equipment Recommendations None recommended by PT  Recommendations for Other Services       Functional Status Assessment Patient has had a recent decline in their functional status and demonstrates the ability to make significant improvements in function in a reasonable and predictable amount of time.     Precautions / Restrictions Precautions Precautions: Fall Recall of Precautions/Restrictions: Intact Restrictions Weight Bearing Restrictions Per Provider Order: No      Mobility  Bed Mobility Overal bed mobility: Modified Independent             General bed mobility comments: increased time to complete    Transfers Overall transfer level: Modified independent Equipment used: None               General transfer comment: Pt completed STS from EOB  w/out an AD and no physical assistance. Increased time to complete.    Ambulation/Gait Ambulation/Gait assistance: Supervision Gait Distance (Feet): 75 Feet Assistive device: None Gait Pattern/deviations: Step-through pattern, Decreased stride length Gait velocity: reduced Gait velocity interpretation: <1.8 ft/sec, indicate of risk for recurrent falls   General Gait Details: Pt ambulates with reciprocal gait pattern and reduced gait speed. No losses of balance or signs of instability noted. Pt able to increased gait speed with time.  Stairs            Wheelchair Mobility     Tilt Bed    Modified Rankin (Stroke Patients Only)       Balance Overall balance assessment: Needs assistance Sitting-balance support: No upper extremity supported, Feet supported Sitting balance-Leahy Scale: Good Sitting balance - Comments: seated EOB   Standing balance support: No upper extremity supported, During functional activity Standing balance-Leahy Scale: Good Standing balance comment: able to shift weight throughout gait with no LOBs                             Pertinent Vitals/Pain Pain Assessment Pain Assessment: No/denies pain    Home Living Family/patient expects to be discharged to:: Private residence Living Arrangements: Spouse/significant other;Children Available Help at Discharge: Family;Available 24 hours/day Type of Home: House Home Access: Level entry       Home Layout: One level Home Equipment: None Additional Comments: pt currently works as an secondary school teacher for [employer]    Prior Function Prior Level of Function : Independent/Modified Independent;Driving;Working/employed  Mobility Comments: pt reports participating in pilates and stretches daily. indep with mobility at baseline ADLs Comments: indep     Extremity/Trunk Assessment   Upper Extremity Assessment Upper Extremity Assessment: Overall WFL for tasks assessed     Lower Extremity Assessment Lower Extremity Assessment: Overall WFL for tasks assessed    Cervical / Trunk Assessment Cervical / Trunk Assessment: Normal  Communication   Communication Communication: No apparent difficulties    Cognition Arousal: Alert Behavior During Therapy: WFL for tasks assessed/performed   PT - Cognitive impairments: No apparent impairments                         Following commands: Intact       Cueing Cueing Techniques: Verbal cues     General Comments General comments (skin integrity, edema, etc.): VSS on RA    Exercises     Assessment/Plan    PT Assessment Patient needs continued PT services  PT Problem List Decreased activity tolerance;Decreased balance;Decreased mobility       PT Treatment Interventions DME instruction;Gait training;Functional mobility training;Therapeutic activities;Therapeutic exercise;Balance training;Patient/family education;Manual techniques;Modalities    PT Goals (Current goals can be found in the Care Plan section)  Acute Rehab PT Goals Patient Stated Goal: to go home PT Goal Formulation: With patient Time For Goal Achievement: 11/27/24 Potential to Achieve Goals: Good Additional Goals Additional Goal #1: Pt will score >19 on DGI demonstrating improved balance and reduced risk of falls    Frequency Min 1X/week     Co-evaluation               AM-PAC PT 6 Clicks Mobility  Outcome Measure Help needed turning from your back to your side while in a flat bed without using bedrails?: None Help needed moving from lying on your back to sitting on the side of a flat bed without using bedrails?: None Help needed moving to and from a bed to a chair (including a wheelchair)?: A Little Help needed standing up from a chair using your arms (e.g., wheelchair or bedside chair)?: A Little Help needed to walk in hospital room?: A Little Help needed climbing 3-5 steps with a railing? : A Little 6 Click  Score: 20    End of Session Equipment Utilized During Treatment: Gait belt Activity Tolerance: Patient tolerated treatment well Patient left: in bed;with call bell/phone within reach Nurse Communication: Mobility status PT Visit Diagnosis: Other abnormalities of gait and mobility (R26.89);Difficulty in walking, not elsewhere classified (R26.2)    Time: 8961-8894 PT Time Calculation (min) (ACUTE ONLY): 27 min   Charges:   PT Evaluation $PT Eval Low Complexity: 1 Low   PT General Charges $$ ACUTE PT VISIT: 1 Visit         Leontine Hilt DPT Acute Rehab Services [phone] Prefer contact via chat   Leontine NOVAK Emett Stapel 11/13/2024, 12:25 PM

## 2024-11-13 NOTE — Discharge Summary (Signed)
 " Physician Discharge Summary   Patient: Brooke Gray MRN: [id number] DOB: November 20, 1945  Admit date:     11/09/2024  Discharge date: 11/13/2024  Discharge Physician: Duffy Al-Sultani   PCP: Benjamine Aland, MD   Recommendations at discharge:   Follow up with PCP within 1-2 weeks of discharge. Repeat CBC, CMP, and CXR. F/u CT scan every 2 years until 5 years for monitoring of pulmonary nodule. Follow up with cardiology as scheduled by cardiology team  Discharge Diagnoses: Principal Problem:   Acute on chronic heart failure with preserved ejection fraction (HFpEF) (HCC) Active Problems:   Elevated troponin   Acute hypoxemic respiratory failure (HCC)   Sepsis (HCC)   Influenza A   Hospital Course: Brooke Gray is a 78 y.o. female with medical history significant of HFpEF, paroxysmal A-fib, renal stenosis status post stenting of right renal artery, hypertension, SVT, hyperlipidemia, anemia, glaucoma presenting with a chief complaint of shortness of breath.  Patient reported shortness of breath, orthopnea, cough, and generalized weakness since the day prior to arrival.  Shortness of breath was sudden onset that evening.  Denies fevers, chills, or chest pain.  States her cardiologist recently stopped Jardiance  as she was having yeast infections.  She was started on finerenone  20 mg daily.  Also takes Entresto  at home and has been compliant with her medications.  Denies lower extremity edema. Reportedly may have had increased dietary sodium intake recently due to the holidays.  Patient is reporting recent mild weight gain.  Admitted for AHRF in the setting of flash pulmonary edema secondary to hypertensive emergency, Influenza A with possible pneumonia.   Hypertensive emergency with flash pulmonary edema Acute on chronic HFpEF Patient presenting with sudden onset shortness of breath with elevated pro BNP and pulmonary edema on imaging in the setting of elevated blood pressure (initially 240/140  with EMS and recorded as 188/147 on arrival to the ED).  Last echo done in June 2025 showing EF 70 to 75%, grade 1 diastolic dysfunction, moderate LVH with severe asymmetric septal hypertrophy suggestive of hypertrophic cardiomyopathy, RV systolic function normal, left atrium moderately dilated, mild mitral regurgitation. She was initially placed on BiPAP and was given IV Lasix  40 mg and nitroglycerin  paste in the ED with improvement of her respiratory status. - Treated with IV Lasix  until felt to be euvolemic - TTE (12/28) LVEF 65-70%, no RWMA, moderate asymmetric LV hypertrophy of the septal segment, maximal septal thickness 14 mm, no systolic anterior motion of the mitral valve, mild MR. - Cardiology recommended increasing home Entresto  to 49-51 mg BID and continuing Finerenone  20 mg daily (newly started). No SGLT2i given yeast infections.  - Follow up with cardiology as scheduled  Sepsis secondary to Influenza A with possible pneumonia CT showing findings concerning for pulmonary edema +/pneumonia.  Influenza A PCR positive.  Met SIRS criteria at the time of presentation with fever, tachycardia, and tachypnea.  - Procalcitonin elevated - Treated with IV Rocephin  and IV Azithromycin  for 4 days,. Given prescription to complete 5 days of Tamiflu    Acute hypoxemic respiratory failure - resolved In the setting of problems listed above.  Hypoxic to the mid 70s on room air and was in respiratory distress requiring BiPAP initially.   - On RA maintaining appropriate SpO2 at the time of discharge   Elevated troponin Likely due to demand ischemia.  Patient is not endorsing chest pain and cardiology is not concerned about ACS at this time.     Steroid-induced hyperglycemia Received Solu-Medrol  by EMS.  No history of diabetes and last hemoglobin A1c 5.6 in July 2025   Remote history of paroxysmal A-fib (2021) Per cardiology, extensive chart review, care everywhere, event monitor demonstrated less than  30 minutes of atrial fibrillation.  Was evaluated [facility] back in 2021 and Eliquis  was recommended but she did not take.  She has not had any other further documented episodes.  Obviously if atrial fibrillation returns, Eliquis  will be needed.   Hyperlipidemia Continue Zetia .   Glaucoma Continue Xalatan  eyedrops.   Incidental CT findings - CT noted unchanged 6 mm subpleural right upper lobe groundglass nodule, recommend. CT every 2 years until 5 years, as per Fleischner Society Guidelines. Moderate pulmonary emphysema is an independent risk factor for lung cancer, recommend consideration for evaluation for a low-dose CT lung cancer screening program - Will need outpatient follow-up for repeat imaging by PCP   At the time of discharge, the patient was hemodynamically stable with improved symptoms, clinically appropriate for discharge, and in no acute distress. The discharge plan, follow-up instructions, return precautions, and medication changes were reviewed in detail. The patient verbalized understanding, was in agreement with the plan, and had all questions answered prior to leaving the hospital.      Consultants: Cardiology Procedures performed: None  Disposition: Home Diet recommendation:  Diet Orders (From admission, onward)     Start     Ordered   11/10/24 0728  Diet heart healthy/carb modified Room service appropriate? Yes; Fluid consistency: Thin; Fluid restriction: 1200 mL Fluid  Diet effective now       Question Answer Comment  Diet-HS Snack? Nothing   Room service appropriate? Yes   Fluid consistency: Thin   Fluid restriction: 1200 mL Fluid      11/10/24 0727            DISCHARGE MEDICATION: Allergies as of 11/13/2024       Reactions   Spironolactone  Other (See Comments)   Visual disturbance.    Penicillins Hives        Medication List     STOP taking these medications    Entresto  24-26 MG Generic drug: sacubitril -valsartan  Replaced by:  sacubitril -valsartan  49-51 MG       TAKE these medications    acetaminophen  500 MG tablet Commonly known as: TYLENOL  Take 500 mg by mouth every 6 (six) hours as needed for mild pain (pain score 1-3), headache or fever.   albuterol  108 (90 Base) MCG/ACT inhaler Commonly known as: VENTOLIN  HFA Inhale 1-2 puffs into the lungs every 4 (four) hours as needed for wheezing or shortness of breath.   amoxicillin -clavulanate 875-125 MG tablet Commonly known as: AUGMENTIN  Take 1 tablet by mouth 2 (two) times daily for 1 day.   aspirin  EC 81 MG tablet Take 1 tablet (81 mg total) by mouth daily. Swallow whole.   ezetimibe  10 MG tablet Commonly known as: ZETIA  Take 1 tablet (10 mg total) by mouth daily.   ferrous sulfate  325 (65 FE) MG EC tablet Take 325 mg by mouth daily as needed (cold hands).   fluticasone  50 MCG/ACT nasal spray Commonly known as: FLONASE  Place 2 sprays into both nostrils daily as needed for allergies or rhinitis.   guaiFENesin  600 MG 12 hr tablet Commonly known as: MUCINEX  Take 1 tablet (600 mg total) by mouth 2 (two) times daily as needed for up to 7 days.   Kerendia  20 MG Tabs Generic drug: Finerenone  Take 1 tablet (20 mg total) by mouth daily.   latanoprost  0.005 %  ophthalmic solution Commonly known as: XALATAN  Place 1 drop into both eyes at bedtime.   oseltamivir  30 MG capsule Commonly known as: TAMIFLU  Take 1 capsule (30 mg total) by mouth 2 (two) times daily for 2 days.   sacubitril -valsartan  49-51 MG Commonly known as: ENTRESTO  Take 1 tablet by mouth 2 (two) times daily. Replaces: Entresto  24-26 MG   UNABLE TO FIND Take 1 Dose by mouth daily. Sea Moss 1 teaspoon   VITAMIN B12 SL Place 1 tablet under the tongue daily.   vitamin C 1000 MG tablet Take 1,000 mg by mouth daily.   Vitamin D3 125 MCG (5000 UT) Tabs Take 5,000 mcg by mouth daily.         Discharge Exam: Filed Weights   11/11/24 0549 11/12/24 0633 11/13/24 0445  Weight:  55.8 kg 55.7 kg 55.5 kg   Blood pressure (!) 156/99, pulse 88, temperature 97.9 F (36.6 C), temperature source Oral, resp. rate 16, height 5' 3 (1.6 m), weight 55.5 kg, SpO2 95%.   Gen: NAD, A&Ox3 HEENT: NCAT Neck: Supple, no JVD CV: RRR, no murmurs Resp: normal WOB, CTAB, no w/r/r Abd: Soft, NTND, no guarding Ext: No LE edema Skin: Warm, dry Neuro: No focal deficits Psych: Calm, cooperative, appropriate affect   Condition at discharge: good  The results of significant diagnostics from this hospitalization (including imaging, microbiology, ancillary and laboratory) are listed below for reference.   Imaging Studies: ECHOCARDIOGRAM COMPLETE Result Date: 11/10/2024    ECHOCARDIOGRAM REPORT   Patient Name:   [facility] Date of Exam: 11/10/2024 Medical Rec #:  [id number]       Height:       63.0 in Accession #:    [id number]      Weight:       127.0 lb Date of Birth:  13-Apr-1946       BSA:          1.594 m Patient Age:    78 years        BP:           113/64 mmHg Patient Gender: F               HR:           89 bpm. Exam Location:  Inpatient Procedure: 2D Echo, Color Doppler and Cardiac Doppler (Both Spectral and Color            Flow Doppler were utilized during procedure). Indications:    CHF-Acute Diastolic I50.31  History:        Patient has prior history of Echocardiogram examinations, most                 recent 05/09/2024. Arrythmias:Atrial Fibrillation;                 Signs/Symptoms:Hypertensive Heart Disease.  Sonographer:    Sydnee Wilson RDCS Referring Phys: [id number] VASUNDHRA RATHORE IMPRESSIONS  1. Maximal septal thickness 14 mm. No systolic anterior motion of the mitral valve. Systolic anterior motion of the mitral valve is not see at rest. No Valsalva performed. Left ventricular ejection fraction, by estimation, is 65 to 70%. The left ventricle has normal function. The left ventricle has no regional wall motion abnormalities. There is moderate asymmetric left ventricular  hypertrophy of the septal segment. Left ventricular diastolic parameters were normal.  2. Right ventricular systolic function is hyperdynamic. The right ventricular size is normal.  3. The mitral valve is normal in structure. Mild mitral valve regurgitation.  4.  The aortic valve is tricuspid. Aortic valve regurgitation is not visualized. No aortic stenosis is present.  5. The inferior vena cava is normal in size with greater than 50% respiratory variability, suggesting right atrial pressure of 3 mmHg. Comparison(s): No significant change from prior study. FINDINGS  Left Ventricle: Maximal septal thickness 14 mm. No systolic anterior motion of the mitral valve. Systolic anterior motion of the mitral valve is not see at rest. No Valsalva performed. Left ventricular ejection fraction, by estimation, is 65 to 70%. The  left ventricle has normal function. The left ventricle has no regional wall motion abnormalities. The left ventricular internal cavity size was normal in size. There is moderate asymmetric left ventricular hypertrophy of the septal segment. Left ventricular diastolic parameters were normal. Right Ventricle: The right ventricular size is normal. No increase in right ventricular wall thickness. Right ventricular systolic function is hyperdynamic. Left [facility]: Left atrial size was normal in size. Right [facility]: Right atrial size was normal in size. Pericardium: There is no evidence of pericardial effusion. Mitral Valve: The mitral valve is normal in structure. Mild mitral valve regurgitation. Tricuspid Valve: The tricuspid valve is normal in structure. Tricuspid valve regurgitation is trivial. No evidence of tricuspid stenosis. Aortic Valve: The aortic valve is tricuspid. Aortic valve regurgitation is not visualized. No aortic stenosis is present. Pulmonic Valve: The pulmonic valve was not well visualized. Pulmonic valve regurgitation is not visualized. No evidence of pulmonic stenosis. Aorta: The aortic root  and ascending aorta are structurally normal, with no evidence of dilitation. Venous: The inferior vena cava is normal in size with greater than 50% respiratory variability, suggesting right atrial pressure of 3 mmHg. IAS/Shunts: The interatrial septum was not well visualized.  LEFT VENTRICLE PLAX 2D LVIDd:         4.00 cm     Diastology LVIDs:         2.50 cm     LV e' medial:    7.51 cm/s LV PW:         1.10 cm     LV E/e' medial:  12.9 LV IVS:        1.40 cm     LV e' lateral:   7.83 cm/s LVOT diam:     2.00 cm     LV E/e' lateral: 12.4 LV SV:         64 LV SV Index:   40 LVOT Area:     3.14 cm  LV Volumes (MOD) LV vol d, MOD A2C: 96.4 ml LV vol d, MOD A4C: 81.2 ml LV vol s, MOD A2C: 40.5 ml LV vol s, MOD A4C: 39.9 ml LV SV MOD A2C:     55.9 ml LV SV MOD A4C:     81.2 ml LV SV MOD BP:      51.5 ml RIGHT VENTRICLE TAPSE (M-mode): 1.8 cm LEFT ATRIUM             Index        RIGHT ATRIUM           Index LA diam:        3.90 cm 2.45 cm/m   RA Area:     12.00 cm LA Vol (A2C):   29.2 ml 18.32 ml/m  RA Volume:   23.20 ml  14.55 ml/m LA Vol (A4C):   53.6 ml 33.62 ml/m LA Biplane Vol: 39.7 ml 24.90 ml/m  AORTIC VALVE LVOT Vmax:   112.00 cm/s LVOT Vmean:  80.100 cm/s LVOT VTI:  0.205 m  AORTA Ao Root diam: 3.20 cm Ao Asc diam:  3.30 cm MITRAL VALVE MV Area (PHT): 4.31 cm    SHUNTS MV Decel Time: 176 msec    Systemic VTI:  0.20 m MV E velocity: 97.20 cm/s  Systemic Diam: 2.00 cm MV A velocity: 55.10 cm/s MV E/A ratio:  1.76 Stanly Leavens MD Electronically signed by Stanly Leavens MD Signature Date/Time: 11/10/2024/12:31:01 PM    Final    CT Angio Chest PE W and/or Wo Contrast Result Date: 11/10/2024 EXAM: CTA CHEST 11/10/2024 12:14:27 AM TECHNIQUE: CTA of the chest was performed without and with the administration of 75 mL of iohexol  (OMNIPAQUE ) 350 MG/ML injection. Multiplanar reformatted images are provided for review. MIP images are provided for review. Automated exposure control, iterative  reconstruction, and/or weight based adjustment of the mA/kV was utilized to reduce the radiation dose to as low as reasonably achievable. COMPARISON: Portable chest yesterday, portable chest 12/07/2022, and CTA chest 11/14/2022. CLINICAL HISTORY: Pulmonary embolism (PE) suspected, high prob. FINDINGS: PULMONARY ARTERIES: Pulmonary arteries are adequately opacified for evaluation. The pulmonary arteries are upper normal in caliber. No evidence of arterial embolism. MEDIASTINUM: There is mild to moderate cardiomegaly with left chamber predominance and septal hypertrophy. Central venous distention is noted. There is no pericardial effusion. No visible coronary calcification. There is aortic atherosclerosis and tortuosity without aneurysm or dissection. There is atherosclerosis in the great vessels with normal variant 2-vessel aortic arch with a brachiobicarotid trunk. There is IVC contrast reflux with pooling or settling of contrast in the IVC and right hepatic veins consistent with slow flow, such as with right heart dysfunction. The finding of contrast pooling could also be seen with impending cardiac arrest and shock. LYMPH NODES: No mediastinal, hilar or axillary lymphadenopathy. LUNGS AND PLEURA: There are symmetric small layering pleural effusions. Moderate emphysematous disease with centrilobular change predominating and paraseptal change in the upper apices. Posterior interstitial and airspace opacities are noted of the right upper and both lower lobes, in the lower lobes greater on the right. There is posterior dense consolidation in the right greater than left lower lobes, which could all be due to airspace edema or could be a combination of airspace edema and pneumonia. Overall findings are favored to represent CHF or fluid overload with asymmetry. Calcified granulomas are again noted in the right upper lobe. A 6 mm subpleural right upper lobe ground glass nodule is unchanged on axial 48 of series 7.  Follow-up CT recommended every 2 years until 5 years of stability is established. The lungs are otherwise clear. No pneumothorax. UPPER ABDOMEN: Limited images of the upper abdomen are unremarkable. SOFT TISSUES AND BONES: There is degenerative change and mild kyphodextroscoliosis of the thoracic spine. No acute or significant osseous findings. No acute soft tissue abnormality. IMPRESSION: 1. No evidence of pulmonary embolism. 2. Findings consistent with CHF or fluid overload with asymmetry, including posterior dense consolidation in the right greater than left lower lobes, which could represent airspace edema or a combination of airspace edema and pneumonia. 3. Mild to moderate cardiomegaly with left chamber predominance, central venous distention, and septal hypertrophy, with IVC contrast reflux and pooling in the IVC and right hepatic veins consistent with slow flow such as with right heart dysfunction ( impending cardiac arrest and shock may also be associated with this finding ). 4. Unchanged 6 mm subpleural right upper lobe groundglass nodule, recommend. CT every 2 years until 5 years, as per Fleischner Society Guidelines. 5. Moderate pulmonary emphysema  is an independent risk factor for lung cancer, recommend consideration for evaluation for a low-dose CT lung cancer screening program. Electronically signed by: Francis Quam MD 11/10/2024 01:15 AM EST RP Workstation: [id number]   DG Chest Port 1 View Result Date: 11/09/2024 EXAM: 1 VIEW(S) XRAY OF THE CHEST 11/09/2024 09:19:00 PM COMPARISON: None available. CLINICAL HISTORY: sob FINDINGS: LUNGS AND PLEURA: Moderate interstitial and alveolar pulmonary edema, with asymmetric infiltrate at the right lung base likely reflecting asymmetric pulmonary edema. Superimposed pneumonic consolidation, however, is difficult to exclude. This appears similar to prior examination of 11/07/2023, favoring recurrent edema, however. Superimposed small right pleural effusion.  No pneumothorax. HEART AND MEDIASTINUM: Heart size at upper limits. Aortic atherosclerosis. BONES AND SOFT TISSUES: No acute osseous abnormality. IMPRESSION: 1. Moderate interstitial and alveolar pulmonary edema with asymmetric right basilar opacity, likely asymmetric pulmonary edema; superimposed pneumonic consolidation is difficult to exclude. 2. Small right pleural effusion. 3. Heart size at the upper limits of normal. 4. Aortic atherosclerosis. Electronically signed by: Dorethia Molt MD 11/09/2024 10:29 PM EST RP Workstation: [id number]    Microbiology: Results for orders placed or performed during the hospital encounter of 11/09/24  Resp panel by RT-PCR (RSV, Flu A&B, Covid) Anterior Nasal Swab     Status: Abnormal   Collection Time: 11/09/24  9:27 PM   Specimen: Anterior Nasal Swab  Result Value Ref Range Status   SARS Coronavirus 2 by RT PCR NEGATIVE NEGATIVE Final   Influenza A by PCR POSITIVE (A) NEGATIVE Final   Influenza B by PCR NEGATIVE NEGATIVE Final    Comment: (NOTE) The Xpert Xpress SARS-CoV-2/FLU/RSV plus assay is intended as an aid in the diagnosis of influenza from Nasopharyngeal swab specimens and should not be used as a sole basis for treatment. Nasal washings and aspirates are unacceptable for Xpert Xpress SARS-CoV-2/FLU/RSV testing.  Fact Sheet for Patients: bloggercourse.com  Fact Sheet for Healthcare Providers: seriousbroker.it  This test is not yet approved or cleared by the [place]  FDA and has been authorized for detection and/or diagnosis of SARS-CoV-2 by FDA under an Emergency Use Authorization (EUA). This EUA will remain in effect (meaning this test can be used) for the duration of the COVID-19 declaration under Section 564(b)(1) of the Act, 21 U.S.C. section 360bbb-3(b)(1), unless the authorization is terminated or revoked.     Resp Syncytial Virus by PCR NEGATIVE NEGATIVE Final    Comment:  (NOTE) Fact Sheet for Patients: bloggercourse.com  Fact Sheet for Healthcare Providers: seriousbroker.it  This test is not yet approved or cleared by the [place]  FDA and has been authorized for detection and/or diagnosis of SARS-CoV-2 by FDA under an Emergency Use Authorization (EUA). This EUA will remain in effect (meaning this test can be used) for the duration of the COVID-19 declaration under Section 564(b)(1) of the Act, 21 U.S.C. section 360bbb-3(b)(1), unless the authorization is terminated or revoked.  Performed at [facility], [address] [place], [place] [address]   Culture, blood (Routine X 2) w Reflex to ID Panel     Status: None (Preliminary result)   Collection Time: 11/10/24  7:48 AM   Specimen: BLOOD  Result Value Ref Range Status   Specimen Description BLOOD BLOOD LEFT HAND  Final   Special Requests   Final    BOTTLES DRAWN AEROBIC AND ANAEROBIC Blood Culture adequate volume   Culture   Final    NO GROWTH 3 DAYS Performed at [facility], [address] [place], [place] [address]  Report Status PENDING  Incomplete  Culture, blood (Routine X 2) w Reflex to ID Panel     Status: None (Preliminary result)   Collection Time: 11/10/24  7:48 AM   Specimen: BLOOD  Result Value Ref Range Status   Specimen Description BLOOD BLOOD RIGHT HAND  Final   Special Requests   Final    BOTTLES DRAWN AEROBIC AND ANAEROBIC Blood Culture adequate volume   Culture   Final    NO GROWTH 3 DAYS Performed at [facility], [address], [place], [place] [address]    Report Status PENDING  Incomplete    Labs: CBC: Recent Labs  Lab 11/09/24 2103 11/10/24 0748 11/11/24 0520 11/12/24 0525 11/13/24 0550  WBC 11.6* 11.3* 5.5 3.2* 2.4*  HGB 14.9 13.0 12.6 12.1 12.6  HCT 43.3 36.3 35.0* 34.0* 35.4*  MCV 91.9 89.2 88.6 88.5 87.2  PLT 162 103* 90* 105* 106*   Basic Metabolic Panel: Recent Labs   Lab 11/09/24 2103 11/10/24 0748 11/11/24 0520 11/12/24 0525 11/13/24 0550  NA 137 136 135 137 136  K 4.0 3.6 3.6 3.8 3.7  CL 102 99 97* 100 101  CO2 23 25 28 30 26   GLUCOSE 221* 141* 98 102* 107*  BUN 14 13 15 15 10   CREATININE 0.77 0.88 0.75 0.67 0.50  CALCIUM  9.1 8.6* 8.3* 8.6* 8.8*   Liver Function Tests: Recent Labs  Lab 11/09/24 2103 11/10/24 0748  AST 59* 48*  ALT 49* 40  ALKPHOS 83 53  BILITOT 0.4 0.5  PROT 7.5 6.5  ALBUMIN 4.4 4.0   CBG: Recent Labs  Lab 11/11/24 2115 11/12/24 0831 11/12/24 1119 11/12/24 1658 11/13/24 0744  GLUCAP 133* 94 142* 114* 114*    Discharge time spent: Time Coordinating Discharge: I spent a total of 35 minutes engaged in face-to-face discussion with the patient and/or caregivers regarding the patients care, assessment, plan, and discharge disposition. Over 50% of this time was dedicated to counseling the patient on the risks and benefits of treatment options and the discharge plan, as well as coordinating post-discharge care.   Signed: Maysen Sudol Al-Sultani, MD [facility] 11/13/2024         "

## 2024-11-13 NOTE — TOC Transition Note (Signed)
 Transition of Care [facility]) - Discharge Note   Patient Details  Name: Brooke Gray MRN: [id number] Date of Birth: [birth year]  Transition of Care [facility]) CM/SW Contact:  Tom-Johnson, Harvest Muskrat, RN Phone Number: 11/13/2024, 11:31 AM   Clinical Narrative:     Patient is scheduled for discharge today.  Readmission Risk Assessment done. Hospital f/u and discharge instructions on AVS. Prescriptions sent to [facility] pharmacy and patient will receive meds prior discharge. No ICM needs or recommendations noted. Husband, Vinie to transport at discharge.  No further ICM needs noted      Final next level of care: Home/Self Care Barriers to Discharge: Barriers Resolved   Patient Goals and CMS Choice Patient states their goals for this hospitalization and ongoing recovery are:: To return home CMS Medicare.gov Compare Post Acute Care list provided to:: Patient Choice offered to / list presented to : NA      Discharge Placement                Patient to be transferred to facility by: Husband Name of family member notified: Vinie    Discharge Plan and Services Additional resources added to the After Visit Summary for                  DME Arranged: N/A DME Agency: NA       HH Arranged: NA HH Agency: NA        Social Drivers of Health (SDOH) Interventions SDOH Screenings   Food Insecurity: Patient Declined (11/10/2024)  Housing: Patient Declined (11/10/2024)  Transportation Needs: Patient Declined (11/10/2024)  Utilities: Patient Declined (11/10/2024)  Alcohol Screen: Low Risk (08/24/2023)  Depression (PHQ2-9): Low Risk (08/28/2023)  Financial Resource Strain: Low Risk (08/24/2023)  Physical Activity: Insufficiently Active (08/24/2023)  Social Connections: Patient Declined (11/10/2024)  Stress: No Stress Concern Present (08/24/2023)  Tobacco Use: Medium Risk (11/09/2024)  Health Literacy: Adequate Health Literacy (08/28/2023)     Readmission Risk  Interventions     No data to display

## 2024-11-13 NOTE — Progress Notes (Signed)
"  °  Progress Note  Patient Name: Brooke Gray Date of Encounter: 11/13/2024 [facility] Cardiologist: None   Interval Summary   Loose, productive cough overall Feels better.   Vital Signs Vitals:   11/12/24 1700 11/12/24 2023 11/13/24 0031 11/13/24 0445  BP: (!) 141/97 (!) 158/97 (!) 158/96 (!) 162/97  Pulse: 84 93 83 82  Resp: 18 17 18 17   Temp: 97.8 F (36.6 C) 97.9 F (36.6 C) 98 F (36.7 C) 98.4 F (36.9 C)  TempSrc: Oral Oral Oral Oral  SpO2: 94% 94% 97% 91%  Weight:    55.5 kg  Height:        Intake/Output Summary (Last 24 hours) at 11/13/2024 0635 Last data filed at 11/13/2024 0400 Gross per 24 hour  Intake 666.05 ml  Output 2300 ml  Net -1633.95 ml      11/13/2024    4:45 AM 11/12/2024    6:33 AM 11/11/2024    5:49 AM  Last 3 Weights  Weight (lbs) 122 lb 6.4 oz 122 lb 12.8 oz 123 lb 1.6 oz  Weight (kg) 55.52 kg 55.702 kg 55.838 kg      Telemetry/ECG  No adverse arrhythmias- Personally Reviewed  Physical Exam  GEN: No acute distress.   Neck: No JVD Cardiac: RRR, no murmurs, rubs, or gallops.  Respiratory: No wheezes bilaterally. GI: Soft, nontender, non-distended  MS: No edema  Assessment & Plan   78 year old female here with flu type A pneumonia with history of chronic diastolic heart failure, elevated troponin of 180 with hypertensive emergency flash pulmonary edema.  Ejection fraction 75% with pattern of hypertrophic cardiomyopathy.  Emphysematous changes on CT.  -Currently on Tamiflu , ceftriaxone  azithromycin  IV, original temperature 101.5. -Mild elevation in troponin likely acute myocardial injury in the setting of influenza as well as heart failure -Continuing with home Entresto  24/26 (will increase to 49/51 with elevated BP) twice daily as well as finerenone  20 mg daily (new start).  Discussed medications at length. -No SGLT2 inhibitor secondary to yeast infection.  Remote history of paroxysmal atrial fibrillation 2021 -  Extensive chart review, care everywhere, event monitor demonstrated less than 30 minutes of atrial fibrillation.  Was evaluated at [facility] back in 2021 and Eliquis  was recommended but she did not take.  She has not had any other further documented episodes.  Obviously if atrial fibrillation returns, Eliquis  will be needed.  Mildly elevated troponin - Acute myocardial injury in the setting of flu  Okay with discharge from cardiology perspective.  We will go ahead and sign off.   For questions or updates, please contact [facility] Please consult [email or web address] for contact info under         Signed, Oneil Parchment, MD   "

## 2024-11-15 ENCOUNTER — Telehealth: Payer: Self-pay

## 2024-11-15 ENCOUNTER — Ambulatory Visit (HOSPITAL_COMMUNITY)

## 2024-11-15 ENCOUNTER — Other Ambulatory Visit (HOSPITAL_COMMUNITY): Payer: Self-pay

## 2024-11-15 ENCOUNTER — Encounter (HOSPITAL_COMMUNITY): Payer: Self-pay

## 2024-11-15 LAB — CULTURE, BLOOD (ROUTINE X 2)
Culture: NO GROWTH
Culture: NO GROWTH
Special Requests: ADEQUATE
Special Requests: ADEQUATE

## 2024-11-15 NOTE — Transitions of Care (Post Inpatient/ED Visit) (Signed)
" ° °  11/15/2024  Name: Havanna Groner MRN: [id number] DOB: [birth year]  Today's TOC FU Call Status: Today's TOC FU Call Status:: Unsuccessful Call (1st Attempt) Unsuccessful Call (1st Attempt) Date: 11/15/24  Attempted to reach the patient regarding the most recent Inpatient/ED visit.  Follow Up Plan: Additional outreach attempts will be made to reach the patient to complete the Transitions of Care (Post Inpatient/ED visit) call.   Shona Prow RN, CCM [place]  VBCI-Population Health RN Care Manager [phone]  "

## 2024-11-15 NOTE — Transitions of Care (Post Inpatient/ED Visit) (Signed)
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 DOB, Name  Shona Prow RN, CCM [place]  VBCI-Population Health RN Care Manager [phone]  "

## 2024-11-17 ENCOUNTER — Emergency Department (HOSPITAL_BASED_OUTPATIENT_CLINIC_OR_DEPARTMENT_OTHER)
Admission: EM | Admit: 2024-11-17 | Discharge: 2024-11-17 | Disposition: A | Discharge status: Home / Self Care | Attending: Emergency Medicine | Admitting: Emergency Medicine

## 2024-11-17 ENCOUNTER — Other Ambulatory Visit: Payer: Self-pay

## 2024-11-17 ENCOUNTER — Emergency Department (HOSPITAL_BASED_OUTPATIENT_CLINIC_OR_DEPARTMENT_OTHER): Admitting: Radiology

## 2024-11-17 ENCOUNTER — Encounter (HOSPITAL_BASED_OUTPATIENT_CLINIC_OR_DEPARTMENT_OTHER): Payer: Self-pay | Admitting: Emergency Medicine

## 2024-11-17 DIAGNOSIS — Z7982 Long term (current) use of aspirin: Secondary | ICD-10-CM | POA: Insufficient documentation

## 2024-11-17 DIAGNOSIS — Z87891 Personal history of nicotine dependence: Secondary | ICD-10-CM | POA: Diagnosis not present

## 2024-11-17 DIAGNOSIS — Z955 Presence of coronary angioplasty implant and graft: Secondary | ICD-10-CM | POA: Insufficient documentation

## 2024-11-17 DIAGNOSIS — I110 Hypertensive heart disease with heart failure: Secondary | ICD-10-CM | POA: Diagnosis not present

## 2024-11-17 DIAGNOSIS — I509 Heart failure, unspecified: Secondary | ICD-10-CM | POA: Diagnosis not present

## 2024-11-17 DIAGNOSIS — R059 Cough, unspecified: Secondary | ICD-10-CM | POA: Diagnosis present

## 2024-11-17 DIAGNOSIS — J40 Bronchitis, not specified as acute or chronic: Secondary | ICD-10-CM | POA: Insufficient documentation

## 2024-11-17 DIAGNOSIS — J101 Influenza due to other identified influenza virus with other respiratory manifestations: Secondary | ICD-10-CM | POA: Insufficient documentation

## 2024-11-17 DIAGNOSIS — Z7951 Long term (current) use of inhaled steroids: Secondary | ICD-10-CM | POA: Insufficient documentation

## 2024-11-17 DIAGNOSIS — J439 Emphysema, unspecified: Secondary | ICD-10-CM | POA: Diagnosis not present

## 2024-11-17 DIAGNOSIS — I482 Chronic atrial fibrillation, unspecified: Secondary | ICD-10-CM | POA: Insufficient documentation

## 2024-11-17 LAB — COMPREHENSIVE METABOLIC PANEL WITH GFR
ALT: 59 U/L — ABNORMAL HIGH (ref 0–44)
AST: 52 U/L — ABNORMAL HIGH (ref 15–41)
Albumin: 4.4 g/dL (ref 3.5–5.0)
Alkaline Phosphatase: 51 U/L (ref 38–126)
Anion gap: 10 (ref 5–15)
BUN: 9 mg/dL (ref 8–23)
CO2: 27 mmol/L (ref 22–32)
Calcium: 10.8 mg/dL — ABNORMAL HIGH (ref 8.9–10.3)
Chloride: 101 mmol/L (ref 98–111)
Creatinine, Ser: 0.66 mg/dL (ref 0.44–1.00)
GFR, Estimated: >60 mL/min
Glucose, Bld: 99 mg/dL (ref 70–99)
Potassium: 4.4 mmol/L (ref 3.5–5.1)
Sodium: 139 mmol/L (ref 135–145)
Total Bilirubin: 0.9 mg/dL (ref 0.0–1.2)
Total Protein: 7.7 g/dL (ref 6.5–8.1)

## 2024-11-17 LAB — CBC WITH DIFFERENTIAL/PLATELET
Abs Immature Granulocytes: 0.01 K/uL (ref 0.00–0.07)
Basophils Absolute: 0 K/uL (ref 0.0–0.1)
Basophils Relative: 0 %
Eosinophils Absolute: 0 K/uL (ref 0.0–0.5)
Eosinophils Relative: 0 %
HCT: 41.3 % (ref 36.0–46.0)
Hemoglobin: 14.5 g/dL (ref 12.0–15.0)
Immature Granulocytes: 0 %
Lymphocytes Relative: 48 %
Lymphs Abs: 1.3 K/uL (ref 0.7–4.0)
MCH: 30.4 pg (ref 26.0–34.0)
MCHC: 35.1 g/dL (ref 30.0–36.0)
MCV: 86.6 fL (ref 80.0–100.0)
Monocytes Absolute: 0.3 K/uL (ref 0.1–1.0)
Monocytes Relative: 11 %
Neutro Abs: 1.2 K/uL — ABNORMAL LOW (ref 1.7–7.7)
Neutrophils Relative %: 41 %
Platelets: 222 K/uL (ref 150–400)
RBC: 4.77 MIL/uL (ref 3.87–5.11)
RDW: 12.2 % (ref 11.5–15.5)
WBC: 2.8 K/uL — ABNORMAL LOW (ref 4.0–10.5)
nRBC: 0 % (ref 0.0–0.2)

## 2024-11-17 LAB — PRO BRAIN NATRIURETIC PEPTIDE: Pro Brain Natriuretic Peptide: 753 pg/mL — ABNORMAL HIGH

## 2024-11-17 MED ORDER — IPRATROPIUM-ALBUTEROL 0.5-2.5 (3) MG/3ML IN SOLN
3.0000 mL | RESPIRATORY_TRACT | Status: AC
  Administered 2024-11-17 (×3): 3 mL via RESPIRATORY_TRACT
  Filled 2024-11-17: qty 9

## 2024-11-17 MED ORDER — AZITHROMYCIN 250 MG PO TABS: 250.0000 mg | ORAL_TABLET | Freq: Every day | ORAL | 0 refills | Status: AC

## 2024-11-17 MED ORDER — BENZONATATE 100 MG PO CAPS
200.0000 mg | ORAL_CAPSULE | Freq: Once | ORAL | Status: AC
  Administered 2024-11-17: 200 mg via ORAL
  Filled 2024-11-17: qty 2

## 2024-11-17 MED ORDER — AZITHROMYCIN 250 MG PO TABS: 250.0000 mg | ORAL_TABLET | Freq: Every day | ORAL | 0 refills | Status: DC

## 2024-11-17 MED ORDER — PROMETHAZINE-DM 6.25-15 MG/5ML PO SYRP: 5.0000 mL | ORAL_SOLUTION | Freq: Four times a day (QID) | ORAL | 0 refills | Status: DC | PRN

## 2024-11-17 MED ORDER — METHYLPREDNISOLONE SODIUM SUCC 125 MG IJ SOLR
125.0000 mg | Freq: Once | INTRAMUSCULAR | Status: AC
  Administered 2024-11-17: 125 mg via INTRAVENOUS
  Filled 2024-11-17: qty 2

## 2024-11-17 MED ORDER — PREDNISONE 10 MG PO TABS: 40.0000 mg | ORAL_TABLET | Freq: Every day | ORAL | 0 refills | Status: DC

## 2024-11-17 MED ORDER — ALBUTEROL SULFATE HFA 108 (90 BASE) MCG/ACT IN AERS
2.0000 | INHALATION_SPRAY | Freq: Once | RESPIRATORY_TRACT | Status: AC
  Administered 2024-11-17: 2 via RESPIRATORY_TRACT
  Filled 2024-11-17: qty 6.7

## 2024-11-17 MED ORDER — PREDNISONE 10 MG PO TABS: 40.0000 mg | ORAL_TABLET | Freq: Every day | ORAL | 0 refills | Status: AC

## 2024-11-17 MED ORDER — BENZONATATE 100 MG PO CAPS: 100.0000 mg | ORAL_CAPSULE | Freq: Three times a day (TID) | ORAL | 0 refills | Status: DC

## 2024-11-17 MED ORDER — PROMETHAZINE-DM 6.25-15 MG/5ML PO SYRP: 5.0000 mL | ORAL_SOLUTION | Freq: Four times a day (QID) | ORAL | 0 refills | Status: AC | PRN

## 2024-11-17 MED ORDER — BENZONATATE 100 MG PO CAPS: 100.0000 mg | ORAL_CAPSULE | Freq: Three times a day (TID) | ORAL | 0 refills | Status: AC

## 2024-11-17 MED ORDER — FUROSEMIDE 10 MG/ML IJ SOLN
40.0000 mg | Freq: Once | INTRAMUSCULAR | Status: AC
  Administered 2024-11-17: 40 mg via INTRAVENOUS
  Filled 2024-11-17: qty 4

## 2024-11-17 NOTE — ED Provider Notes (Addendum)
 " [place] EMERGENCY DEPARTMENT AT [facility] Provider Note   CSN: [id number] Arrival date & time: 11/17/24  1110     Patient presents with: Shortness of Breath   Brooke Gray is a 79 y.o. female.   HPI      78 year old female with a history of heart failure with a preserved ejection fraction, paroxysmal atrial fibrillation (single brief episode in 2021, not on anticoagulation), renal stenosis status post stenting of the right renal artery, hypertension, SVT, hyperlipidemia, anemia, glaucoma, with recent admission 1227 with concern for acute on chronic heart failure with hypertensive emergency and flash pulmonary edema, influenza A with possible pneumonia, hypoxic respiratory failure who presents with concern for cough, dyspnea.    Had cough, no phlegm during admission, no congestion Released her 2 days ago and ever since then has ahd continuous dry cough, so severe that can't sleep. Coughing all night long.  Trying to get some relief. And now can't catch her breath with all of the coughing. No leg swelling No chest pain No nausea, vomiting or abdominal pain No runny nose No chills or fever Didn't take blood thinner tdoay but was taking it Does feel like breathing treatments helped while in hospital Emphysema on CT 12/28 but no hx of lung disease, prior smoking  Does feel husbands breathing treatment helped   Past Medical History:  Diagnosis Date   A-fib (HCC)    Cardiomegaly    Glaucoma    Hypertension      Prior to Admission medications  Medication Sig Start Date End Date Taking? Authorizing Provider  acetaminophen  (TYLENOL ) 500 MG tablet Take 500 mg by mouth every 6 (six) hours as needed for mild pain (pain score 1-3), headache or fever.    [provider]  albuterol  (VENTOLIN  HFA) 108 (90 Base) MCG/ACT inhaler Inhale 1-2 puffs into the lungs every 4 (four) hours as needed for wheezing or shortness of breath. 09/10/24   [provider]   Ascorbic Acid (VITAMIN C) 1000 MG tablet Take 1,000 mg by mouth daily.    [provider]  aspirin  EC 81 MG tablet Take 1 tablet (81 mg total) by mouth daily. Swallow whole. 12/13/22   Darron Deatrice LABOR, MD  azithromycin  (ZITHROMAX ) 250 MG tablet Take 1 tablet (250 mg total) by mouth daily. Take first 2 tablets together, then 1 every day until finished. 11/17/24   Dreama Longs, MD  benzonatate  (TESSALON ) 100 MG capsule Take 1 capsule (100 mg total) by mouth every 8 (eight) hours. 11/17/24   Dreama Longs, MD  Cholecalciferol (VITAMIN D3) 125 MCG (5000 UT) TABS Take 5,000 mcg by mouth daily.    [provider]  Cyanocobalamin  (VITAMIN B12 SL) Place 1 tablet under the tongue daily.    [provider]  ezetimibe  (ZETIA ) 10 MG tablet Take 1 tablet (10 mg total) by mouth daily. 01/16/24 04/23/25  Darron Deatrice LABOR, MD  ferrous sulfate  325 (65 FE) MG EC tablet Take 325 mg by mouth daily as needed (cold hands).    [provider]  Finerenone  (KERENDIA ) 20 MG TABS Take 1 tablet (20 mg total) by mouth daily. 11/13/24   Al-Sultani, Anmar, MD  fluticasone  (FLONASE ) 50 MCG/ACT nasal spray Place 2 sprays into both nostrils daily as needed for allergies or rhinitis.    [provider]  guaiFENesin  (MUCINEX ) 600 MG 12 hr tablet Take 1 tablet (600 mg total) by mouth 2 (two) times daily as needed for up to 7 days. 11/13/24 11/20/24  Al-Sultani, Anmar, MD  latanoprost  (XALATAN ) 0.005 % ophthalmic solution Place 1 drop into both eyes at bedtime.  03/30/19   [provider]  predniSONE  (DELTASONE ) 10 MG tablet Take 4 tablets (40 mg total) by mouth daily for 4 days. 11/17/24 11/21/24  Dreama Longs, MD  promethazine -dextromethorphan (PROMETHAZINE -DM) 6.25-15 MG/5ML syrup Take 5 mLs by mouth 4 (four) times daily as needed for cough. 11/17/24   Dreama Longs, MD  sacubitril -valsartan  (ENTRESTO ) 49-51 MG Take 1 tablet by mouth 2 (two) times daily. 11/13/24   Al-Sultani,  Anmar, MD  UNABLE TO FIND Take 1 Dose by mouth daily. Sea Moss 1 teaspoon    [provider]    Allergies: Spironolactone  and Penicillins    Review of Systems  Updated Vital Signs BP (!) 179/105   Pulse 78   Temp 98.5 F (36.9 C) (Oral)   Resp (!) 21   SpO2 95%   Physical Exam Vitals and nursing note reviewed.  Constitutional:      General: She is not in acute distress.    Appearance: She is well-developed. She is not diaphoretic.  HENT:     Head: Normocephalic and atraumatic.  Eyes:     Conjunctiva/sclera: Conjunctivae normal.  Neck:     Vascular: JVD present.  Cardiovascular:     Rate and Rhythm: Normal rate and regular rhythm.     Heart sounds: Normal heart sounds. No murmur heard.    No friction rub. No gallop.  Pulmonary:     Effort: Pulmonary effort is normal. No respiratory distress.     Breath sounds: Normal breath sounds. No rales. Wheezes: frequent cough, wheeze with cough. Abdominal:     General: There is no distension.     Palpations: Abdomen is soft.     Tenderness: There is no abdominal tenderness. There is no guarding.  Musculoskeletal:        General: No tenderness.     Cervical back: Normal range of motion.  Skin:    General: Skin is warm and dry.     Findings: No erythema or rash.  Neurological:     Mental Status: She is alert and oriented to person, place, and time.     (all labs ordered are listed, but only abnormal results are displayed) Labs Reviewed  CBC WITH DIFFERENTIAL/PLATELET - Abnormal; Notable for the following components:      Result Value   WBC 2.8 (*)    Neutro Abs 1.2 (*)    All other components within normal limits  COMPREHENSIVE METABOLIC PANEL WITH GFR - Abnormal; Notable for the following components:   Calcium  10.8 (*)    AST 52 (*)    ALT 59 (*)    All other components within normal limits  PRO BRAIN NATRIURETIC PEPTIDE - Abnormal; Notable for the following components:   Pro Brain Natriuretic Peptide 753.0  (*)    All other components within normal limits    EKG: EKG Interpretation Date/Time:  Sunday November 17 2024 13:23:56 EST Ventricular Rate:  72 PR Interval:    QRS Duration:  87 QT Interval:  449 QTC Calculation: 492 R Axis:   -75  Text Interpretation: Normal sinus rhythm Since prior ECG, rate has decreased similar appearance LVH Confirmed by Dreama Longs ([id number]) on 11/17/2024 2:12:28 PM  Radiology: DG Chest 2 View Result Date: 11/17/2024 CLINICAL DATA:  Cough EXAM: CHEST - 2 VIEW COMPARISON:  November 09, 2024 FINDINGS: Stable cardiomediastinal silhouette. Both lungs are clear. The visualized skeletal structures are unremarkable.  IMPRESSION: No active cardiopulmonary disease. Electronically Signed   By: Lynwood Landy Raddle M.D.   On: 11/17/2024 14:01     Procedures   Medications Ordered in the ED  methylPREDNISolone  sodium succinate (SOLU-MEDROL ) 125 mg/2 mL injection 125 mg (125 mg Intravenous Given 11/17/24 1324)  ipratropium-albuterol  (DUONEB) 0.5-2.5 (3) MG/3ML nebulizer solution 3 mL (3 mLs Nebulization Given 11/17/24 1442)  furosemide  (LASIX ) injection 40 mg (40 mg Intravenous Given 11/17/24 1459)  benzonatate  (TESSALON ) capsule 200 mg (200 mg Oral Given 11/17/24 1458)  albuterol  (VENTOLIN  HFA) 108 (90 Base) MCG/ACT inhaler 2 puff (2 puffs Inhalation Given 11/17/24 1446)                                     79 year old female with a history of heart failure with a preserved ejection fraction, paroxysmal atrial fibrillation (single brief episode in 2021, not on anticoagulation), renal stenosis status post stenting of the right renal artery, hypertension, SVT, hyperlipidemia, anemia, glaucoma, with recent admission 1227 with concern for acute on chronic heart failure with hypertensive emergency and flash pulmonary edema, influenza A with possible pneumonia, hypoxic respiratory failure who presents with concern for cough, dyspnea.   Differential diagnosis for dyspnea includes ACS, PE,  COPD exacerbation, CHF exacerbation, anemia, pneumonia, viral etiology such as COVID 19 infection, metabolic abnormality.   EKG has similar appearance to prior, LVH, rate has decreased.  CXR evaluated by me and radiology shows no evidence of pulmonary edema nor pneumonia.  Labs show proBNP decreased from prior, no significant electrolyte abnormalities, continued leukopenia compared to recent.    Primary concern is severity of cough.  Had CT PE study at time of admission that ws negative for PE and these symptoms are a continuation of initial admission symptoms and have low clinical suspicion at this time for PE in setting of recent negative study, positive influenza A testing. No chest pain, primary cough symptoms, doubt ACS.  She does have emphysema on CT and history of smoking and suspect influenza with underlying COPD/emphysema/reactive airway disease likely contributing to severity of cough.  She also has JVD on exam and consider some continued volume.  Given IV lasix  in the ED with good UOP, given solumedrol and duonebs with improvement of symptoms.   Recommend continued supportive care in the setting of recovering influenza, in setting of emphysema, mild volume overload . Given rx for prednisone  azithromycin  for possible bacterial bronchitis or copd, and medications for cough, including phenergan -DM syrup and tessalon .  Given albuterol  inhaler for cough as well. Patient discharged in stable condition with understanding of reasons to return.       Final diagnoses:  Influenza A  Emphysema of lung (HCC)  Bronchitis    ED Discharge Orders          Ordered    promethazine -dextromethorphan (PROMETHAZINE -DM) 6.25-15 MG/5ML syrup  4 times daily PRN,   Status:  Discontinued        11/17/24 1423    azithromycin  (ZITHROMAX ) 250 MG tablet  Daily,   Status:  Discontinued        11/17/24 1423    predniSONE  (DELTASONE ) 10 MG tablet  Daily,   Status:  Discontinued        11/17/24 1423     benzonatate  (TESSALON ) 100 MG capsule  Every 8 hours,   Status:  Discontinued        11/17/24 1423    azithromycin  (ZITHROMAX ) 250  MG tablet  Daily        11/17/24 1545    benzonatate  (TESSALON ) 100 MG capsule  Every 8 hours        11/17/24 1545    predniSONE  (DELTASONE ) 10 MG tablet  Daily        11/17/24 1545    promethazine -dextromethorphan (PROMETHAZINE -DM) 6.25-15 MG/5ML syrup  4 times daily PRN        11/17/24 1545              Dreama Longs, MD 11/17/24 2228  "

## 2024-11-17 NOTE — ED Triage Notes (Signed)
 Recent flu dx and admission for PNA. States symptoms have not improved since dc on 12/31

## 2024-11-17 NOTE — ED Notes (Signed)
 Patient called out stating I can't breathe. Patient remains connected to continuous pulse oximetry reading 98% on room air. Patients respirations 16, even, and unlabored. Patient in no acute distress. Speaking in full sentences. Patient reassured that provider will be in to see her as soon as they are available. Patient verbalized understanding.

## 2024-11-17 NOTE — ED Notes (Signed)
 Patient transported to x-ray. ?

## 2024-11-20 ENCOUNTER — Telehealth: Payer: Self-pay

## 2024-11-20 NOTE — Progress Notes (Signed)
 Pharmacy Quality Measure Review   This patient is appearing on a report for being at risk of failing the adherence measure for diabetes (empagliflozin ) medications this calendar year.   Medication: Empagliflozin  10mg  tablet Last fill date: 06/03/2024 for a 90 day supply   Per Dr.Vann's note on 11/12/24, Empagiflozin 10mg  tablet was discontinued by the patient's cardiologist due to recurrent yeast infections.   Averly Ericson Student-PharmD

## 2025-01-21 ENCOUNTER — Ambulatory Visit: Admitting: Cardiovascular Disease
